# Patient Record
Sex: Female | Born: 1975 | Race: White | Hispanic: No | Smoking: Never smoker
Health system: Southern US, Community
[De-identification: ages and names within clinical notes are randomized; demographics above are authoritative.]

## PROBLEM LIST (undated history)

## (undated) DIAGNOSIS — T7840XA Allergy, unspecified, initial encounter: Secondary | ICD-10-CM

## (undated) DIAGNOSIS — R51 Headache: Secondary | ICD-10-CM

## (undated) DIAGNOSIS — D649 Anemia, unspecified: Secondary | ICD-10-CM

## (undated) DIAGNOSIS — Z23 Encounter for immunization: Secondary | ICD-10-CM

## (undated) DIAGNOSIS — F32A Depression, unspecified: Secondary | ICD-10-CM

## (undated) DIAGNOSIS — J302 Other seasonal allergic rhinitis: Secondary | ICD-10-CM

## (undated) DIAGNOSIS — R519 Headache, unspecified: Secondary | ICD-10-CM

## (undated) DIAGNOSIS — Z9289 Personal history of other medical treatment: Secondary | ICD-10-CM

## (undated) DIAGNOSIS — S86019A Strain of unspecified Achilles tendon, initial encounter: Secondary | ICD-10-CM

## (undated) DIAGNOSIS — F419 Anxiety disorder, unspecified: Secondary | ICD-10-CM

## (undated) HISTORY — DX: Anxiety disorder, unspecified: F41.9

## (undated) HISTORY — DX: Strain of unspecified achilles tendon, initial encounter: S86.019A

## (undated) HISTORY — PX: WISDOM TOOTH EXTRACTION: SHX21

## (undated) HISTORY — DX: Personal history of other medical treatment: Z92.89

## (undated) HISTORY — DX: Headache: R51

## (undated) HISTORY — DX: Other seasonal allergic rhinitis: J30.2

## (undated) HISTORY — DX: Headache, unspecified: R51.9

## (undated) HISTORY — DX: Depression, unspecified: F32.A

## (undated) HISTORY — DX: Allergy, unspecified, initial encounter: T78.40XA

## (undated) HISTORY — DX: Anemia, unspecified: D64.9

## (undated) HISTORY — DX: Encounter for immunization: Z23

---

## 2005-07-23 ENCOUNTER — Ambulatory Visit: Payer: Self-pay | Admitting: Sports Medicine

## 2005-08-13 ENCOUNTER — Ambulatory Visit: Payer: Self-pay | Admitting: Sports Medicine

## 2005-12-06 ENCOUNTER — Ambulatory Visit: Payer: Self-pay | Admitting: Sports Medicine

## 2006-03-25 ENCOUNTER — Ambulatory Visit: Payer: Self-pay | Admitting: Sports Medicine

## 2006-05-20 ENCOUNTER — Ambulatory Visit: Payer: Self-pay | Admitting: Sports Medicine

## 2006-07-22 ENCOUNTER — Ambulatory Visit: Payer: Self-pay | Admitting: Sports Medicine

## 2006-07-24 ENCOUNTER — Other Ambulatory Visit: Admission: RE | Admit: 2006-07-24 | Discharge: 2006-07-24 | Payer: Self-pay | Admitting: Obstetrics and Gynecology

## 2007-03-13 ENCOUNTER — Ambulatory Visit: Payer: Self-pay | Admitting: Sports Medicine

## 2007-03-13 DIAGNOSIS — M25569 Pain in unspecified knee: Secondary | ICD-10-CM | POA: Insufficient documentation

## 2007-04-03 DIAGNOSIS — M76829 Posterior tibial tendinitis, unspecified leg: Secondary | ICD-10-CM | POA: Insufficient documentation

## 2007-05-13 ENCOUNTER — Ambulatory Visit: Payer: Self-pay | Admitting: Sports Medicine

## 2007-09-29 ENCOUNTER — Other Ambulatory Visit: Admission: RE | Admit: 2007-09-29 | Discharge: 2007-09-29 | Payer: Self-pay | Admitting: Obstetrics and Gynecology

## 2009-05-07 ENCOUNTER — Inpatient Hospital Stay: Payer: Self-pay

## 2010-10-23 NOTE — Assessment & Plan Note (Signed)
Summary: foot pain/runner/el   Vital Signs:  Patient Profile:   35 Years Old Female Weight:      126 pounds Pulse rate:   91 / minute BP sitting:   144 / 84  Vitals Entered By: Lillia Pauls CMA (May 13, 2007 9:17 AM)               Chief Complaint:  L TOP OF FOOT PAIN X 6 WEEKS.  History of Present Illness: 35 y/o F with L dorsal foot pain. Began 11 Jul-- ran 6 miles that am.  Then was member of wedding party-- in high heels all day, then had dancing in high-heeled shoes c tight laces-- by end of evening, signif swelling throughout dorsum of foot.  Pt is runner, pre-injury was at 25 mi/wk. Since injury, unable to run s pain.  Still c swelling which localizes to dorsal forefoot near MT heads.  pt points to pain specifically along EHL tendon, at TMT joint, also at tib anterior tendon  Pt BP also noted to be mildly elevated, pt states cafe latte just prior to appt, and endorses signif stress with mult recent life changes.        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  insp: L with visible nodule/swelling at Tib anterior tendon, near level of talonavic joint visible swelling length of EHL pt with thin feet, min subcut fat.  palp: focal ttp L TMT joint, ttp along tib anterior at nodule.  U/S assessment-- L tib ant: positive bull'seye on transverse view, pos fluid collection on lateral view, some disorganization of tendon fibers. 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 We will attempt padding of tongue of walking and running shoes, as well as an arch binder brace continue icing gradual return to activity pt to see [place] back in 3 weeks, for reassessment voices understanding of plan  Complete Medication List: 1)  Voltaren 1 % Gel (Diclofenac sodium)   Patient Instructions: 1)  follow up in 3 weeks

## 2010-10-23 NOTE — Assessment & Plan Note (Signed)
Summary: adjust orthotics/el   Vital Signs:  Patient Profile:   35 Years Old Female Weight:      122 pounds Pulse rate:   73 / minute BP sitting:   138 / 88  Vitals Entered By: Lillia Pauls CMA (March 13, 2007 10:39 AM)               Chief Complaint:  adjust orthotics.  History of Present Illness: Veronica Gibson back for recheck orthotics have been comfortable now having some RT ant knee pain though No hx of injury for this keeping mileage at 30 per week feels pain toward end of long run  doing wt work - lunges appear too deep        Physical Exam  General:     Well-developed,well-nourished,in no acute distress; alert,appropriate and cooperative throughout examination; thin but muscular Head:     Normocephalic and atraumatic without obvious abnormalities. No apparent alopecia or balding. Msk:     knee exam shows no effusion; stable ligaments; negative Mcmurray's and provocative meniscal tests; non painful patellar compression; patellar and quadriceps tendons unremarkable  VMO strength good bilat However RT leg shows weakness at Rt gluteus medius step down shows valgus drift of knee     Impression & Recommendations:  Problem # 1:  KNEE PAIN, RIGHT (ICD-719.46) Assessment: New given 4 way straight leg lifts lat leg strength emphasis on RT change wt work and use modified technique for lunge step downs and procpiroceptive training keep up orthotic use re ck prn Orders: [facility]- Est Level  3 ([id number])

## 2010-11-08 ENCOUNTER — Inpatient Hospital Stay: Payer: Self-pay

## 2013-03-24 ENCOUNTER — Encounter: Payer: Self-pay | Admitting: Sports Medicine

## 2013-03-24 ENCOUNTER — Ambulatory Visit (INDEPENDENT_AMBULATORY_CARE_PROVIDER_SITE_OTHER): Payer: BC Managed Care – PPO | Admitting: Sports Medicine

## 2013-03-24 VITALS — BP 110/72 | HR 68 | Ht 65.0 in | Wt 120.0 lb

## 2013-03-24 DIAGNOSIS — M357 Hypermobility syndrome: Secondary | ICD-10-CM | POA: Insufficient documentation

## 2013-03-24 DIAGNOSIS — R269 Unspecified abnormalities of gait and mobility: Secondary | ICD-10-CM

## 2013-03-24 DIAGNOSIS — Q796 Ehlers-Danlos syndrome, unspecified: Secondary | ICD-10-CM

## 2013-03-24 NOTE — Assessment & Plan Note (Signed)
Patient was fitted for a : standard, cushioned, semi-rigid orthotic. The orthotic was heated and afterward the patient stood on the orthotic blank positioned on the orthotic stand. The patient was positioned in subtalar neutral position and 10 degrees of ankle dorsiflexion in a weight bearing stance. After completion of molding, a stable base was applied to the orthotic blank. The blank was ground to a stable position for weight bearing. Size: 7 red EVA Base: blue EVA Posting: lateral 5th ray posts Additional orthotic padding: heel wedges and MT pads  We spent 45 minutes making corrections on new orthotics. On completion of these she was able to run with a neutral foot strike.  Her last pair lasted 8 years. I advised her that most people only get about 3 years but she should see if they are controlling her pain and if so she can continue using until they show significant breakdown

## 2013-03-24 NOTE — Assessment & Plan Note (Signed)
I suggested strength exercises around major joints because with her hypermobility she is at a risk for joint and musculoskeletal injury as she begins training for a new marathon  She also needs to continue on some abdominal and pelvic exercises She has a tendency toward some stress incontinence with running too fast that arose after childbirth

## 2013-03-24 NOTE — Progress Notes (Signed)
Patient ID: Veronica Gibson, female   DOB: [birth year], 37 y.o.   MRN: [id number]  Hx of navicular Fx RT foot in HS Has been in orthotics since that time We made orthotics in 2006  Hx of knee pain Rt > LT  Ran HS and D3 Lot of knee pain in college  Changing shoes frequently helps  Professor at [employer]  Thin and fit Excellent quad and hip strength Alignment good Leg length is normal  Feet show splaying at 1/2 toes Hammering and deviation of toes 4/5 small bunionettes Rt arch pronates  beighton score 5/6  Running gait shows pronation unless she has correction

## 2013-09-29 ENCOUNTER — Ambulatory Visit (INDEPENDENT_AMBULATORY_CARE_PROVIDER_SITE_OTHER): Payer: BC Managed Care – PPO | Admitting: Sports Medicine

## 2013-09-29 ENCOUNTER — Encounter: Payer: Self-pay | Admitting: Sports Medicine

## 2013-09-29 VITALS — BP 125/77 | Ht 65.5 in | Wt 118.0 lb

## 2013-09-29 DIAGNOSIS — M7750 Other enthesopathy of unspecified foot: Secondary | ICD-10-CM

## 2013-09-29 DIAGNOSIS — M7742 Metatarsalgia, left foot: Principal | ICD-10-CM

## 2013-09-29 DIAGNOSIS — M7741 Metatarsalgia, right foot: Secondary | ICD-10-CM | POA: Insufficient documentation

## 2013-09-29 DIAGNOSIS — M7651 Patellar tendinitis, right knee: Secondary | ICD-10-CM

## 2013-09-29 DIAGNOSIS — R269 Unspecified abnormalities of gait and mobility: Secondary | ICD-10-CM

## 2013-09-29 DIAGNOSIS — M7650 Patellar tendinitis, unspecified knee: Secondary | ICD-10-CM

## 2013-09-29 NOTE — Progress Notes (Addendum)
   Subjective:    Patient ID: Veronica Gibson, female    DOB: 1975/10/01, 38 y.o.   MRN: [id number]  HPI  Pt presents to clinic for evaluation of left toe pain that has persisted since running the [place] marathon in mid November.   Had in her in orthotics for greater than 10 years for transverse arch collapse and metatarsalgia These have allowed her to run a with minimal foot pain until recently Former HS and collegiate runner  Rt anterior knee pain- flared x 2 days. Hx of fall on rt knee with ice skating years ago.   Social history-professor of statistics at [employer]     Objective:   Physical Exam NAD, thin female BP 125/77  Ht 5' 5.5" (1.664 m)  Wt 118 lb (53.524 kg)  BMI 19.33 kg/m2  Lt foot - varus deviation of lat collum with bunionette Toes 2-3 show valgus deviation Transverse arch moderately collapsed Callus and MT thickening on 2nd MT head   Rt foot - similar changes but less  Transverse arch mild drop   Long arch preserved bilat  Good post tib function   Rt knee- Good ROM Ligament testing normal Mcmurry's negative  Minimal tenderness at the upper patellar tendon No effusion  Ultrasound examination At the medial and midportion of the upper patellar tendon there is a small hypoechoic area On transverse scan this seems to be less than 10% of the tendon Doppler flow is unremarkable Quadriceps tendon, meniscus and retropatellar cartilage all look normal No suprapatellar Pouch effusion      Assessment & Plan:

## 2013-09-29 NOTE — Assessment & Plan Note (Signed)
Resupported both arches on her orthotics  Added a lateral post because of the lateral foot breakdown  This was comfortable after completion

## 2013-09-29 NOTE — Assessment & Plan Note (Signed)
I replaced metatarsal pads on both of her  Orthotics  This helped reduce the forefoot pain

## 2013-09-29 NOTE — Assessment & Plan Note (Signed)
Small deficit in tendon noted on ultrasound  Begin a rehabilitation program of declined squats  Modify other lifting  Use a patellar helix strap  If this worsens we will consider nitroglycerin patches  rck prn

## 2013-12-13 DIAGNOSIS — Z9289 Personal history of other medical treatment: Secondary | ICD-10-CM

## 2013-12-13 HISTORY — DX: Personal history of other medical treatment: Z92.89

## 2013-12-13 LAB — HM MAMMOGRAPHY

## 2014-04-07 DIAGNOSIS — Z9289 Personal history of other medical treatment: Secondary | ICD-10-CM

## 2014-04-07 HISTORY — DX: Personal history of other medical treatment: Z92.89

## 2014-04-07 LAB — HM PAP SMEAR

## 2015-12-28 DIAGNOSIS — J301 Allergic rhinitis due to pollen: Secondary | ICD-10-CM | POA: Diagnosis not present

## 2015-12-28 DIAGNOSIS — J3089 Other allergic rhinitis: Secondary | ICD-10-CM | POA: Diagnosis not present

## 2015-12-28 DIAGNOSIS — J3081 Allergic rhinitis due to animal (cat) (dog) hair and dander: Secondary | ICD-10-CM | POA: Diagnosis not present

## 2016-01-04 DIAGNOSIS — J301 Allergic rhinitis due to pollen: Secondary | ICD-10-CM | POA: Diagnosis not present

## 2016-01-04 DIAGNOSIS — J3081 Allergic rhinitis due to animal (cat) (dog) hair and dander: Secondary | ICD-10-CM | POA: Diagnosis not present

## 2016-01-04 DIAGNOSIS — J3089 Other allergic rhinitis: Secondary | ICD-10-CM | POA: Diagnosis not present

## 2016-01-09 DIAGNOSIS — J3081 Allergic rhinitis due to animal (cat) (dog) hair and dander: Secondary | ICD-10-CM | POA: Diagnosis not present

## 2016-01-09 DIAGNOSIS — J301 Allergic rhinitis due to pollen: Secondary | ICD-10-CM | POA: Diagnosis not present

## 2016-01-09 DIAGNOSIS — J3089 Other allergic rhinitis: Secondary | ICD-10-CM | POA: Diagnosis not present

## 2016-01-16 DIAGNOSIS — J3081 Allergic rhinitis due to animal (cat) (dog) hair and dander: Secondary | ICD-10-CM | POA: Diagnosis not present

## 2016-01-16 DIAGNOSIS — J3089 Other allergic rhinitis: Secondary | ICD-10-CM | POA: Diagnosis not present

## 2016-01-16 DIAGNOSIS — J301 Allergic rhinitis due to pollen: Secondary | ICD-10-CM | POA: Diagnosis not present

## 2016-01-17 DIAGNOSIS — F411 Generalized anxiety disorder: Secondary | ICD-10-CM | POA: Diagnosis not present

## 2016-01-25 DIAGNOSIS — J3089 Other allergic rhinitis: Secondary | ICD-10-CM | POA: Diagnosis not present

## 2016-01-25 DIAGNOSIS — J3081 Allergic rhinitis due to animal (cat) (dog) hair and dander: Secondary | ICD-10-CM | POA: Diagnosis not present

## 2016-01-25 DIAGNOSIS — J301 Allergic rhinitis due to pollen: Secondary | ICD-10-CM | POA: Diagnosis not present

## 2016-01-30 DIAGNOSIS — J3081 Allergic rhinitis due to animal (cat) (dog) hair and dander: Secondary | ICD-10-CM | POA: Diagnosis not present

## 2016-01-30 DIAGNOSIS — J3089 Other allergic rhinitis: Secondary | ICD-10-CM | POA: Diagnosis not present

## 2016-01-30 DIAGNOSIS — J301 Allergic rhinitis due to pollen: Secondary | ICD-10-CM | POA: Diagnosis not present

## 2016-02-06 DIAGNOSIS — J3081 Allergic rhinitis due to animal (cat) (dog) hair and dander: Secondary | ICD-10-CM | POA: Diagnosis not present

## 2016-02-06 DIAGNOSIS — J301 Allergic rhinitis due to pollen: Secondary | ICD-10-CM | POA: Diagnosis not present

## 2016-02-06 DIAGNOSIS — J3089 Other allergic rhinitis: Secondary | ICD-10-CM | POA: Diagnosis not present

## 2016-02-08 DIAGNOSIS — J3081 Allergic rhinitis due to animal (cat) (dog) hair and dander: Secondary | ICD-10-CM | POA: Diagnosis not present

## 2016-02-08 DIAGNOSIS — J301 Allergic rhinitis due to pollen: Secondary | ICD-10-CM | POA: Diagnosis not present

## 2016-02-09 DIAGNOSIS — J3089 Other allergic rhinitis: Secondary | ICD-10-CM | POA: Diagnosis not present

## 2016-02-09 DIAGNOSIS — J3081 Allergic rhinitis due to animal (cat) (dog) hair and dander: Secondary | ICD-10-CM | POA: Diagnosis not present

## 2016-02-09 DIAGNOSIS — J301 Allergic rhinitis due to pollen: Secondary | ICD-10-CM | POA: Diagnosis not present

## 2016-02-15 ENCOUNTER — Encounter: Payer: Self-pay | Admitting: Podiatry

## 2016-02-15 ENCOUNTER — Ambulatory Visit (INDEPENDENT_AMBULATORY_CARE_PROVIDER_SITE_OTHER): Payer: BLUE CROSS/BLUE SHIELD

## 2016-02-15 ENCOUNTER — Ambulatory Visit (INDEPENDENT_AMBULATORY_CARE_PROVIDER_SITE_OTHER): Payer: BLUE CROSS/BLUE SHIELD | Admitting: Podiatry

## 2016-02-15 VITALS — BP 114/60 | HR 54 | Resp 18

## 2016-02-15 DIAGNOSIS — M2580 Other specified joint disorders, unspecified joint: Secondary | ICD-10-CM

## 2016-02-15 DIAGNOSIS — J3089 Other allergic rhinitis: Secondary | ICD-10-CM | POA: Diagnosis not present

## 2016-02-15 DIAGNOSIS — R52 Pain, unspecified: Secondary | ICD-10-CM

## 2016-02-15 DIAGNOSIS — J301 Allergic rhinitis due to pollen: Secondary | ICD-10-CM | POA: Diagnosis not present

## 2016-02-15 DIAGNOSIS — J3081 Allergic rhinitis due to animal (cat) (dog) hair and dander: Secondary | ICD-10-CM | POA: Diagnosis not present

## 2016-02-15 NOTE — Progress Notes (Signed)
Subjective:     Patient ID: Veronica Gibson, female   DOB: 12-May-1976, 40 y.o.   MRN: [id number]  HPI 40 year old female presents the office today for concerns of pain in the ball of her left foot which is been ongoing for about 1 year. She was the pain started after she was doing a 5 mile run uphill. She is an avid runner and runs marathons. She has tried offloading type pad which helps some. The patch that she uses is a teardrop pad. She states his been swollen over the last year but has decreased somewhat. No redness or warmth. She still able to run about 5 miles a day at a jog without any problems. She has CMO. No other complaint.s   Review of Systems  All other systems reviewed and are negative.      Objective:   Physical Exam General: AAO x3, NAD  Dermatological: Skin is warm, dry and supple bilateral. Nails x 10 are well manicured; remaining integument appears unremarkable at this time. There are no open sores, no preulcerative lesions, no rash or signs of infection present.  Vascular: Dorsalis Pedis artery and Posterior Tibial artery pedal pulses are 2/4 bilateral with immedate capillary fill time. Pedal hair growth present. No varicosities and no lower extremity edema present bilateral. There is no pain with calf compression, swelling, warmth, erythema.   Neruologic: Grossly intact via light touch bilateral. Vibratory intact via tuning fork bilateral. Protective threshold with Semmes Wienstein monofilament intact to all pedal sites bilateral. Patellar and Achilles deep tendon reflexes 2+ bilateral. No Babinski or clonus noted bilateral.   Musculoskeletal: There is a decrease in medial arch height upon weightbearing. There are some mild edema to the plantar aspect left foot submetatarsal one. There is no pain to the sesamoids or to the MPJ. There is a mild HAV present. No overlying erythema or increase in warmth. No area pinpoint bony tenderness there is no pain vibratory sensation. MMT  5/5, ROM WNL.   Gait: Unassisted, Nonantalgic.      Assessment:     40 year old female with sesamoiditis; biomechanical changes      Plan:     -Treatment options discussed including all alternatives, risks, and complications -Etiology of symptoms were discussed -X-rays were obtained and reviewed with the patient. No evidence of acute fracture. On the medial sesamoid there is a small area of which may be an old injury. -I made an offloading pad for the sesamoids and applied this to her insert and I also gave her other pads to try is well. -Ice as well as anti-inflammatories as needed -Follow-up in 4 weeks if symptoms continue or sooner if any issues are to arise.  Celesta Gentile, DPM

## 2016-02-20 DIAGNOSIS — J3081 Allergic rhinitis due to animal (cat) (dog) hair and dander: Secondary | ICD-10-CM | POA: Diagnosis not present

## 2016-02-20 DIAGNOSIS — J301 Allergic rhinitis due to pollen: Secondary | ICD-10-CM | POA: Diagnosis not present

## 2016-02-20 DIAGNOSIS — J3089 Other allergic rhinitis: Secondary | ICD-10-CM | POA: Diagnosis not present

## 2016-02-22 DIAGNOSIS — J3081 Allergic rhinitis due to animal (cat) (dog) hair and dander: Secondary | ICD-10-CM | POA: Diagnosis not present

## 2016-02-22 DIAGNOSIS — J301 Allergic rhinitis due to pollen: Secondary | ICD-10-CM | POA: Diagnosis not present

## 2016-02-22 DIAGNOSIS — J3089 Other allergic rhinitis: Secondary | ICD-10-CM | POA: Diagnosis not present

## 2016-02-29 DIAGNOSIS — J3081 Allergic rhinitis due to animal (cat) (dog) hair and dander: Secondary | ICD-10-CM | POA: Diagnosis not present

## 2016-02-29 DIAGNOSIS — J301 Allergic rhinitis due to pollen: Secondary | ICD-10-CM | POA: Diagnosis not present

## 2016-02-29 DIAGNOSIS — J3089 Other allergic rhinitis: Secondary | ICD-10-CM | POA: Diagnosis not present

## 2016-03-06 DIAGNOSIS — J3089 Other allergic rhinitis: Secondary | ICD-10-CM | POA: Diagnosis not present

## 2016-03-12 DIAGNOSIS — J3081 Allergic rhinitis due to animal (cat) (dog) hair and dander: Secondary | ICD-10-CM | POA: Diagnosis not present

## 2016-03-12 DIAGNOSIS — J301 Allergic rhinitis due to pollen: Secondary | ICD-10-CM | POA: Diagnosis not present

## 2016-03-12 DIAGNOSIS — J3089 Other allergic rhinitis: Secondary | ICD-10-CM | POA: Diagnosis not present

## 2016-03-28 DIAGNOSIS — J3081 Allergic rhinitis due to animal (cat) (dog) hair and dander: Secondary | ICD-10-CM | POA: Diagnosis not present

## 2016-03-28 DIAGNOSIS — J301 Allergic rhinitis due to pollen: Secondary | ICD-10-CM | POA: Diagnosis not present

## 2016-03-28 DIAGNOSIS — J3089 Other allergic rhinitis: Secondary | ICD-10-CM | POA: Diagnosis not present

## 2016-04-02 DIAGNOSIS — J3081 Allergic rhinitis due to animal (cat) (dog) hair and dander: Secondary | ICD-10-CM | POA: Diagnosis not present

## 2016-04-02 DIAGNOSIS — J301 Allergic rhinitis due to pollen: Secondary | ICD-10-CM | POA: Diagnosis not present

## 2016-04-02 DIAGNOSIS — J3089 Other allergic rhinitis: Secondary | ICD-10-CM | POA: Diagnosis not present

## 2016-04-05 DIAGNOSIS — J3089 Other allergic rhinitis: Secondary | ICD-10-CM | POA: Diagnosis not present

## 2016-04-05 DIAGNOSIS — J3081 Allergic rhinitis due to animal (cat) (dog) hair and dander: Secondary | ICD-10-CM | POA: Diagnosis not present

## 2016-04-05 DIAGNOSIS — J301 Allergic rhinitis due to pollen: Secondary | ICD-10-CM | POA: Diagnosis not present

## 2016-04-09 DIAGNOSIS — J3081 Allergic rhinitis due to animal (cat) (dog) hair and dander: Secondary | ICD-10-CM | POA: Diagnosis not present

## 2016-04-09 DIAGNOSIS — J3089 Other allergic rhinitis: Secondary | ICD-10-CM | POA: Diagnosis not present

## 2016-04-09 DIAGNOSIS — J301 Allergic rhinitis due to pollen: Secondary | ICD-10-CM | POA: Diagnosis not present

## 2016-04-11 DIAGNOSIS — J3081 Allergic rhinitis due to animal (cat) (dog) hair and dander: Secondary | ICD-10-CM | POA: Diagnosis not present

## 2016-04-11 DIAGNOSIS — J301 Allergic rhinitis due to pollen: Secondary | ICD-10-CM | POA: Diagnosis not present

## 2016-04-11 DIAGNOSIS — J3089 Other allergic rhinitis: Secondary | ICD-10-CM | POA: Diagnosis not present

## 2016-04-17 DIAGNOSIS — F411 Generalized anxiety disorder: Secondary | ICD-10-CM | POA: Diagnosis not present

## 2016-04-22 DIAGNOSIS — Z01419 Encounter for gynecological examination (general) (routine) without abnormal findings: Secondary | ICD-10-CM | POA: Diagnosis not present

## 2016-04-23 DIAGNOSIS — J3089 Other allergic rhinitis: Secondary | ICD-10-CM | POA: Diagnosis not present

## 2016-04-23 DIAGNOSIS — J301 Allergic rhinitis due to pollen: Secondary | ICD-10-CM | POA: Diagnosis not present

## 2016-04-23 DIAGNOSIS — J3081 Allergic rhinitis due to animal (cat) (dog) hair and dander: Secondary | ICD-10-CM | POA: Diagnosis not present

## 2016-04-25 DIAGNOSIS — J3081 Allergic rhinitis due to animal (cat) (dog) hair and dander: Secondary | ICD-10-CM | POA: Diagnosis not present

## 2016-04-25 DIAGNOSIS — J3089 Other allergic rhinitis: Secondary | ICD-10-CM | POA: Diagnosis not present

## 2016-04-25 DIAGNOSIS — J301 Allergic rhinitis due to pollen: Secondary | ICD-10-CM | POA: Diagnosis not present

## 2016-04-30 DIAGNOSIS — J3081 Allergic rhinitis due to animal (cat) (dog) hair and dander: Secondary | ICD-10-CM | POA: Diagnosis not present

## 2016-04-30 DIAGNOSIS — J301 Allergic rhinitis due to pollen: Secondary | ICD-10-CM | POA: Diagnosis not present

## 2016-04-30 DIAGNOSIS — J3089 Other allergic rhinitis: Secondary | ICD-10-CM | POA: Diagnosis not present

## 2016-05-07 DIAGNOSIS — J301 Allergic rhinitis due to pollen: Secondary | ICD-10-CM | POA: Diagnosis not present

## 2016-05-07 DIAGNOSIS — J3089 Other allergic rhinitis: Secondary | ICD-10-CM | POA: Diagnosis not present

## 2016-05-07 DIAGNOSIS — J3081 Allergic rhinitis due to animal (cat) (dog) hair and dander: Secondary | ICD-10-CM | POA: Diagnosis not present

## 2016-05-16 DIAGNOSIS — J301 Allergic rhinitis due to pollen: Secondary | ICD-10-CM | POA: Diagnosis not present

## 2016-05-16 DIAGNOSIS — J3081 Allergic rhinitis due to animal (cat) (dog) hair and dander: Secondary | ICD-10-CM | POA: Diagnosis not present

## 2016-05-16 DIAGNOSIS — J3089 Other allergic rhinitis: Secondary | ICD-10-CM | POA: Diagnosis not present

## 2016-05-21 DIAGNOSIS — J301 Allergic rhinitis due to pollen: Secondary | ICD-10-CM | POA: Diagnosis not present

## 2016-05-21 DIAGNOSIS — J3089 Other allergic rhinitis: Secondary | ICD-10-CM | POA: Diagnosis not present

## 2016-05-21 DIAGNOSIS — J3081 Allergic rhinitis due to animal (cat) (dog) hair and dander: Secondary | ICD-10-CM | POA: Diagnosis not present

## 2016-05-30 DIAGNOSIS — J301 Allergic rhinitis due to pollen: Secondary | ICD-10-CM | POA: Diagnosis not present

## 2016-05-30 DIAGNOSIS — J3089 Other allergic rhinitis: Secondary | ICD-10-CM | POA: Diagnosis not present

## 2016-05-30 DIAGNOSIS — J3081 Allergic rhinitis due to animal (cat) (dog) hair and dander: Secondary | ICD-10-CM | POA: Diagnosis not present

## 2016-06-07 DIAGNOSIS — J301 Allergic rhinitis due to pollen: Secondary | ICD-10-CM | POA: Diagnosis not present

## 2016-06-07 DIAGNOSIS — J3089 Other allergic rhinitis: Secondary | ICD-10-CM | POA: Diagnosis not present

## 2016-06-07 DIAGNOSIS — J3081 Allergic rhinitis due to animal (cat) (dog) hair and dander: Secondary | ICD-10-CM | POA: Diagnosis not present

## 2016-06-28 DIAGNOSIS — J301 Allergic rhinitis due to pollen: Secondary | ICD-10-CM | POA: Diagnosis not present

## 2016-06-28 DIAGNOSIS — J3089 Other allergic rhinitis: Secondary | ICD-10-CM | POA: Diagnosis not present

## 2016-06-28 DIAGNOSIS — J3081 Allergic rhinitis due to animal (cat) (dog) hair and dander: Secondary | ICD-10-CM | POA: Diagnosis not present

## 2016-07-05 DIAGNOSIS — J3089 Other allergic rhinitis: Secondary | ICD-10-CM | POA: Diagnosis not present

## 2016-07-05 DIAGNOSIS — J301 Allergic rhinitis due to pollen: Secondary | ICD-10-CM | POA: Diagnosis not present

## 2016-07-05 DIAGNOSIS — J3081 Allergic rhinitis due to animal (cat) (dog) hair and dander: Secondary | ICD-10-CM | POA: Diagnosis not present

## 2016-07-09 DIAGNOSIS — J3089 Other allergic rhinitis: Secondary | ICD-10-CM | POA: Diagnosis not present

## 2016-07-09 DIAGNOSIS — J3081 Allergic rhinitis due to animal (cat) (dog) hair and dander: Secondary | ICD-10-CM | POA: Diagnosis not present

## 2016-07-09 DIAGNOSIS — J301 Allergic rhinitis due to pollen: Secondary | ICD-10-CM | POA: Diagnosis not present

## 2016-07-18 DIAGNOSIS — J3081 Allergic rhinitis due to animal (cat) (dog) hair and dander: Secondary | ICD-10-CM | POA: Diagnosis not present

## 2016-07-18 DIAGNOSIS — J301 Allergic rhinitis due to pollen: Secondary | ICD-10-CM | POA: Diagnosis not present

## 2016-07-18 DIAGNOSIS — J3089 Other allergic rhinitis: Secondary | ICD-10-CM | POA: Diagnosis not present

## 2016-08-08 DIAGNOSIS — F411 Generalized anxiety disorder: Secondary | ICD-10-CM | POA: Diagnosis not present

## 2016-08-09 DIAGNOSIS — J301 Allergic rhinitis due to pollen: Secondary | ICD-10-CM | POA: Diagnosis not present

## 2016-08-09 DIAGNOSIS — J3089 Other allergic rhinitis: Secondary | ICD-10-CM | POA: Diagnosis not present

## 2016-08-09 DIAGNOSIS — J3081 Allergic rhinitis due to animal (cat) (dog) hair and dander: Secondary | ICD-10-CM | POA: Diagnosis not present

## 2016-08-13 DIAGNOSIS — J3089 Other allergic rhinitis: Secondary | ICD-10-CM | POA: Diagnosis not present

## 2016-08-13 DIAGNOSIS — J301 Allergic rhinitis due to pollen: Secondary | ICD-10-CM | POA: Diagnosis not present

## 2016-08-13 DIAGNOSIS — J3081 Allergic rhinitis due to animal (cat) (dog) hair and dander: Secondary | ICD-10-CM | POA: Diagnosis not present

## 2016-08-21 DIAGNOSIS — J3081 Allergic rhinitis due to animal (cat) (dog) hair and dander: Secondary | ICD-10-CM | POA: Diagnosis not present

## 2016-08-21 DIAGNOSIS — J301 Allergic rhinitis due to pollen: Secondary | ICD-10-CM | POA: Diagnosis not present

## 2016-08-23 DIAGNOSIS — J3081 Allergic rhinitis due to animal (cat) (dog) hair and dander: Secondary | ICD-10-CM | POA: Diagnosis not present

## 2016-08-23 DIAGNOSIS — J301 Allergic rhinitis due to pollen: Secondary | ICD-10-CM | POA: Diagnosis not present

## 2016-08-23 DIAGNOSIS — J3089 Other allergic rhinitis: Secondary | ICD-10-CM | POA: Diagnosis not present

## 2016-08-29 DIAGNOSIS — J3089 Other allergic rhinitis: Secondary | ICD-10-CM | POA: Diagnosis not present

## 2016-08-29 DIAGNOSIS — J3081 Allergic rhinitis due to animal (cat) (dog) hair and dander: Secondary | ICD-10-CM | POA: Diagnosis not present

## 2016-08-29 DIAGNOSIS — J301 Allergic rhinitis due to pollen: Secondary | ICD-10-CM | POA: Diagnosis not present

## 2016-09-03 DIAGNOSIS — J301 Allergic rhinitis due to pollen: Secondary | ICD-10-CM | POA: Diagnosis not present

## 2016-09-03 DIAGNOSIS — J3081 Allergic rhinitis due to animal (cat) (dog) hair and dander: Secondary | ICD-10-CM | POA: Diagnosis not present

## 2016-09-03 DIAGNOSIS — J3089 Other allergic rhinitis: Secondary | ICD-10-CM | POA: Diagnosis not present

## 2016-09-05 DIAGNOSIS — J3089 Other allergic rhinitis: Secondary | ICD-10-CM | POA: Diagnosis not present

## 2016-09-05 DIAGNOSIS — J3081 Allergic rhinitis due to animal (cat) (dog) hair and dander: Secondary | ICD-10-CM | POA: Diagnosis not present

## 2016-09-05 DIAGNOSIS — J301 Allergic rhinitis due to pollen: Secondary | ICD-10-CM | POA: Diagnosis not present

## 2016-09-12 DIAGNOSIS — J3081 Allergic rhinitis due to animal (cat) (dog) hair and dander: Secondary | ICD-10-CM | POA: Diagnosis not present

## 2016-09-12 DIAGNOSIS — J3089 Other allergic rhinitis: Secondary | ICD-10-CM | POA: Diagnosis not present

## 2016-09-12 DIAGNOSIS — J301 Allergic rhinitis due to pollen: Secondary | ICD-10-CM | POA: Diagnosis not present

## 2016-09-19 DIAGNOSIS — J3089 Other allergic rhinitis: Secondary | ICD-10-CM | POA: Diagnosis not present

## 2016-09-19 DIAGNOSIS — J301 Allergic rhinitis due to pollen: Secondary | ICD-10-CM | POA: Diagnosis not present

## 2016-09-19 DIAGNOSIS — J3081 Allergic rhinitis due to animal (cat) (dog) hair and dander: Secondary | ICD-10-CM | POA: Diagnosis not present

## 2016-09-20 DIAGNOSIS — J3089 Other allergic rhinitis: Secondary | ICD-10-CM | POA: Diagnosis not present

## 2016-09-20 DIAGNOSIS — J3081 Allergic rhinitis due to animal (cat) (dog) hair and dander: Secondary | ICD-10-CM | POA: Diagnosis not present

## 2016-09-20 DIAGNOSIS — J301 Allergic rhinitis due to pollen: Secondary | ICD-10-CM | POA: Diagnosis not present

## 2016-09-25 DIAGNOSIS — J3089 Other allergic rhinitis: Secondary | ICD-10-CM | POA: Diagnosis not present

## 2016-11-04 DIAGNOSIS — F411 Generalized anxiety disorder: Secondary | ICD-10-CM | POA: Diagnosis not present

## 2016-11-14 DIAGNOSIS — J3081 Allergic rhinitis due to animal (cat) (dog) hair and dander: Secondary | ICD-10-CM | POA: Diagnosis not present

## 2016-11-14 DIAGNOSIS — J301 Allergic rhinitis due to pollen: Secondary | ICD-10-CM | POA: Diagnosis not present

## 2016-11-14 DIAGNOSIS — J3089 Other allergic rhinitis: Secondary | ICD-10-CM | POA: Diagnosis not present

## 2016-11-19 DIAGNOSIS — J3089 Other allergic rhinitis: Secondary | ICD-10-CM | POA: Diagnosis not present

## 2016-11-19 DIAGNOSIS — J3081 Allergic rhinitis due to animal (cat) (dog) hair and dander: Secondary | ICD-10-CM | POA: Diagnosis not present

## 2016-11-19 DIAGNOSIS — J301 Allergic rhinitis due to pollen: Secondary | ICD-10-CM | POA: Diagnosis not present

## 2016-11-21 DIAGNOSIS — J301 Allergic rhinitis due to pollen: Secondary | ICD-10-CM | POA: Diagnosis not present

## 2016-11-21 DIAGNOSIS — J3081 Allergic rhinitis due to animal (cat) (dog) hair and dander: Secondary | ICD-10-CM | POA: Diagnosis not present

## 2016-11-21 DIAGNOSIS — J3089 Other allergic rhinitis: Secondary | ICD-10-CM | POA: Diagnosis not present

## 2016-11-25 ENCOUNTER — Ambulatory Visit: Payer: BLUE CROSS/BLUE SHIELD | Admitting: Medical

## 2016-11-25 VITALS — BP 135/80 | HR 86 | Temp 99.9°F

## 2016-11-25 DIAGNOSIS — J111 Influenza due to unidentified influenza virus with other respiratory manifestations: Secondary | ICD-10-CM

## 2016-11-25 MED ORDER — OSELTAMIVIR PHOSPHATE 75 MG PO CAPS: 75.0000 mg | ORAL_CAPSULE | Freq: Two times a day (BID) | ORAL | 0 refills | Status: AC

## 2016-11-25 NOTE — Progress Notes (Signed)
   Subjective:    Patient ID: Veronica Gibson, female    DOB: [birth year], 41 y.o.   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 Up on the 3rd floor mold was found.  She says she never feels good, except right after antibiotic treatment.  Currently using nettie pot  3 times a day with yellow discharge.  Nasal congestion started last Wednesday. Fever last night of  101.9.  Fevers started last Friday. Mild body aches, and sore throat. With swallowing hears popping in both ears, with left side worse. Sees  Dr. Orvil Feil for allergy injections usually once a week (gets two injections). Son Psychologist, prison and probation services) just sick with B influenza virus. Husband and son sick with A influenza during Jan 2018. She also feels she caught influenza A at that time.    Review of Systems fevers, st, headache, mild bodyaches, feels  lymph nodes are swolle Nasal congestion, runny nose. Mild cough Other review of systems negative.     Objective:   Physical Exam  non acute distress, does not look like she feels well. HEENT: nasal congestion, erythema of nares , left tm wnl , right appears red, pharynx erythema cobblestoning of posterior pharynx Lungs clear to auscultation bilaterally Heart:rrr no mumurs rubs or gallops. Adenopathy cervical chain.        Assessment & Plan:  Influenza most likely B  Tamiflu 75mg  one po bid x 5 days Hydrate, alternate ibuprofen with tylenol Communicated to patient she must be fever free x 48 hours wuth no motrin or tylenol, in order to return to work.

## 2016-12-03 DIAGNOSIS — J301 Allergic rhinitis due to pollen: Secondary | ICD-10-CM | POA: Diagnosis not present

## 2016-12-03 DIAGNOSIS — J3089 Other allergic rhinitis: Secondary | ICD-10-CM | POA: Diagnosis not present

## 2016-12-03 DIAGNOSIS — J3081 Allergic rhinitis due to animal (cat) (dog) hair and dander: Secondary | ICD-10-CM | POA: Diagnosis not present

## 2016-12-05 DIAGNOSIS — J3089 Other allergic rhinitis: Secondary | ICD-10-CM | POA: Diagnosis not present

## 2016-12-19 DIAGNOSIS — J301 Allergic rhinitis due to pollen: Secondary | ICD-10-CM | POA: Diagnosis not present

## 2016-12-19 DIAGNOSIS — J3089 Other allergic rhinitis: Secondary | ICD-10-CM | POA: Diagnosis not present

## 2016-12-19 DIAGNOSIS — J3081 Allergic rhinitis due to animal (cat) (dog) hair and dander: Secondary | ICD-10-CM | POA: Diagnosis not present

## 2016-12-24 DIAGNOSIS — J3081 Allergic rhinitis due to animal (cat) (dog) hair and dander: Secondary | ICD-10-CM | POA: Diagnosis not present

## 2016-12-24 DIAGNOSIS — J301 Allergic rhinitis due to pollen: Secondary | ICD-10-CM | POA: Diagnosis not present

## 2016-12-24 DIAGNOSIS — J3089 Other allergic rhinitis: Secondary | ICD-10-CM | POA: Diagnosis not present

## 2016-12-31 DIAGNOSIS — J3081 Allergic rhinitis due to animal (cat) (dog) hair and dander: Secondary | ICD-10-CM | POA: Diagnosis not present

## 2016-12-31 DIAGNOSIS — J3089 Other allergic rhinitis: Secondary | ICD-10-CM | POA: Diagnosis not present

## 2016-12-31 DIAGNOSIS — J301 Allergic rhinitis due to pollen: Secondary | ICD-10-CM | POA: Diagnosis not present

## 2017-01-07 DIAGNOSIS — J301 Allergic rhinitis due to pollen: Secondary | ICD-10-CM | POA: Diagnosis not present

## 2017-01-07 DIAGNOSIS — J3089 Other allergic rhinitis: Secondary | ICD-10-CM | POA: Diagnosis not present

## 2017-01-07 DIAGNOSIS — J3081 Allergic rhinitis due to animal (cat) (dog) hair and dander: Secondary | ICD-10-CM | POA: Diagnosis not present

## 2017-01-16 DIAGNOSIS — J3089 Other allergic rhinitis: Secondary | ICD-10-CM | POA: Diagnosis not present

## 2017-01-16 DIAGNOSIS — J301 Allergic rhinitis due to pollen: Secondary | ICD-10-CM | POA: Diagnosis not present

## 2017-01-16 DIAGNOSIS — J3081 Allergic rhinitis due to animal (cat) (dog) hair and dander: Secondary | ICD-10-CM | POA: Diagnosis not present

## 2017-01-23 DIAGNOSIS — J3081 Allergic rhinitis due to animal (cat) (dog) hair and dander: Secondary | ICD-10-CM | POA: Diagnosis not present

## 2017-01-23 DIAGNOSIS — J301 Allergic rhinitis due to pollen: Secondary | ICD-10-CM | POA: Diagnosis not present

## 2017-01-23 DIAGNOSIS — J3089 Other allergic rhinitis: Secondary | ICD-10-CM | POA: Diagnosis not present

## 2017-01-28 DIAGNOSIS — J3089 Other allergic rhinitis: Secondary | ICD-10-CM | POA: Diagnosis not present

## 2017-01-28 DIAGNOSIS — J301 Allergic rhinitis due to pollen: Secondary | ICD-10-CM | POA: Diagnosis not present

## 2017-01-28 DIAGNOSIS — J3081 Allergic rhinitis due to animal (cat) (dog) hair and dander: Secondary | ICD-10-CM | POA: Diagnosis not present

## 2017-02-04 DIAGNOSIS — F411 Generalized anxiety disorder: Secondary | ICD-10-CM | POA: Diagnosis not present

## 2017-02-06 DIAGNOSIS — J3089 Other allergic rhinitis: Secondary | ICD-10-CM | POA: Diagnosis not present

## 2017-02-06 DIAGNOSIS — J3081 Allergic rhinitis due to animal (cat) (dog) hair and dander: Secondary | ICD-10-CM | POA: Diagnosis not present

## 2017-02-06 DIAGNOSIS — J301 Allergic rhinitis due to pollen: Secondary | ICD-10-CM | POA: Diagnosis not present

## 2017-02-13 DIAGNOSIS — J3089 Other allergic rhinitis: Secondary | ICD-10-CM | POA: Diagnosis not present

## 2017-02-13 DIAGNOSIS — J301 Allergic rhinitis due to pollen: Secondary | ICD-10-CM | POA: Diagnosis not present

## 2017-02-13 DIAGNOSIS — J3081 Allergic rhinitis due to animal (cat) (dog) hair and dander: Secondary | ICD-10-CM | POA: Diagnosis not present

## 2017-02-21 DIAGNOSIS — J301 Allergic rhinitis due to pollen: Secondary | ICD-10-CM | POA: Diagnosis not present

## 2017-02-21 DIAGNOSIS — J3081 Allergic rhinitis due to animal (cat) (dog) hair and dander: Secondary | ICD-10-CM | POA: Diagnosis not present

## 2017-02-21 DIAGNOSIS — J3089 Other allergic rhinitis: Secondary | ICD-10-CM | POA: Diagnosis not present

## 2017-02-28 DIAGNOSIS — J3081 Allergic rhinitis due to animal (cat) (dog) hair and dander: Secondary | ICD-10-CM | POA: Diagnosis not present

## 2017-02-28 DIAGNOSIS — J301 Allergic rhinitis due to pollen: Secondary | ICD-10-CM | POA: Diagnosis not present

## 2017-02-28 DIAGNOSIS — J3089 Other allergic rhinitis: Secondary | ICD-10-CM | POA: Diagnosis not present

## 2017-03-11 DIAGNOSIS — J301 Allergic rhinitis due to pollen: Secondary | ICD-10-CM | POA: Diagnosis not present

## 2017-03-11 DIAGNOSIS — J3081 Allergic rhinitis due to animal (cat) (dog) hair and dander: Secondary | ICD-10-CM | POA: Diagnosis not present

## 2017-03-11 DIAGNOSIS — J3089 Other allergic rhinitis: Secondary | ICD-10-CM | POA: Diagnosis not present

## 2017-03-20 DIAGNOSIS — J301 Allergic rhinitis due to pollen: Secondary | ICD-10-CM | POA: Diagnosis not present

## 2017-03-20 DIAGNOSIS — J3089 Other allergic rhinitis: Secondary | ICD-10-CM | POA: Diagnosis not present

## 2017-03-20 DIAGNOSIS — J3081 Allergic rhinitis due to animal (cat) (dog) hair and dander: Secondary | ICD-10-CM | POA: Diagnosis not present

## 2017-03-25 DIAGNOSIS — J3081 Allergic rhinitis due to animal (cat) (dog) hair and dander: Secondary | ICD-10-CM | POA: Diagnosis not present

## 2017-03-25 DIAGNOSIS — J301 Allergic rhinitis due to pollen: Secondary | ICD-10-CM | POA: Diagnosis not present

## 2017-03-25 DIAGNOSIS — J3089 Other allergic rhinitis: Secondary | ICD-10-CM | POA: Diagnosis not present

## 2017-03-27 DIAGNOSIS — J3081 Allergic rhinitis due to animal (cat) (dog) hair and dander: Secondary | ICD-10-CM | POA: Diagnosis not present

## 2017-03-27 DIAGNOSIS — J301 Allergic rhinitis due to pollen: Secondary | ICD-10-CM | POA: Diagnosis not present

## 2017-03-28 DIAGNOSIS — J3089 Other allergic rhinitis: Secondary | ICD-10-CM | POA: Diagnosis not present

## 2017-04-08 DIAGNOSIS — J3081 Allergic rhinitis due to animal (cat) (dog) hair and dander: Secondary | ICD-10-CM | POA: Diagnosis not present

## 2017-04-08 DIAGNOSIS — J3089 Other allergic rhinitis: Secondary | ICD-10-CM | POA: Diagnosis not present

## 2017-04-08 DIAGNOSIS — J301 Allergic rhinitis due to pollen: Secondary | ICD-10-CM | POA: Diagnosis not present

## 2017-04-17 DIAGNOSIS — J301 Allergic rhinitis due to pollen: Secondary | ICD-10-CM | POA: Diagnosis not present

## 2017-04-17 DIAGNOSIS — J3081 Allergic rhinitis due to animal (cat) (dog) hair and dander: Secondary | ICD-10-CM | POA: Diagnosis not present

## 2017-04-17 DIAGNOSIS — J3089 Other allergic rhinitis: Secondary | ICD-10-CM | POA: Diagnosis not present

## 2017-04-22 DIAGNOSIS — J3089 Other allergic rhinitis: Secondary | ICD-10-CM | POA: Diagnosis not present

## 2017-04-22 DIAGNOSIS — J301 Allergic rhinitis due to pollen: Secondary | ICD-10-CM | POA: Diagnosis not present

## 2017-04-22 DIAGNOSIS — J3081 Allergic rhinitis due to animal (cat) (dog) hair and dander: Secondary | ICD-10-CM | POA: Diagnosis not present

## 2017-05-01 DIAGNOSIS — J301 Allergic rhinitis due to pollen: Secondary | ICD-10-CM | POA: Diagnosis not present

## 2017-05-01 DIAGNOSIS — J3081 Allergic rhinitis due to animal (cat) (dog) hair and dander: Secondary | ICD-10-CM | POA: Diagnosis not present

## 2017-05-01 DIAGNOSIS — J3089 Other allergic rhinitis: Secondary | ICD-10-CM | POA: Diagnosis not present

## 2017-05-06 ENCOUNTER — Encounter: Payer: Self-pay | Admitting: Obstetrics and Gynecology

## 2017-05-06 ENCOUNTER — Ambulatory Visit (INDEPENDENT_AMBULATORY_CARE_PROVIDER_SITE_OTHER): Payer: BLUE CROSS/BLUE SHIELD | Admitting: Obstetrics and Gynecology

## 2017-05-06 VITALS — BP 112/70 | Ht 66.0 in | Wt 134.0 lb

## 2017-05-06 DIAGNOSIS — Z1231 Encounter for screening mammogram for malignant neoplasm of breast: Secondary | ICD-10-CM

## 2017-05-06 DIAGNOSIS — Z1239 Encounter for other screening for malignant neoplasm of breast: Secondary | ICD-10-CM

## 2017-05-06 DIAGNOSIS — J3089 Other allergic rhinitis: Secondary | ICD-10-CM | POA: Diagnosis not present

## 2017-05-06 DIAGNOSIS — Z01419 Encounter for gynecological examination (general) (routine) without abnormal findings: Secondary | ICD-10-CM | POA: Diagnosis not present

## 2017-05-06 DIAGNOSIS — Z124 Encounter for screening for malignant neoplasm of cervix: Secondary | ICD-10-CM

## 2017-05-06 DIAGNOSIS — J301 Allergic rhinitis due to pollen: Secondary | ICD-10-CM | POA: Diagnosis not present

## 2017-05-06 DIAGNOSIS — Z1151 Encounter for screening for human papillomavirus (HPV): Secondary | ICD-10-CM | POA: Diagnosis not present

## 2017-05-06 DIAGNOSIS — J3081 Allergic rhinitis due to animal (cat) (dog) hair and dander: Secondary | ICD-10-CM | POA: Diagnosis not present

## 2017-05-06 NOTE — Progress Notes (Signed)
Chief Complaint  Patient presents with  . Annual Exam     HPI:      [name] is a 41 y.o. [phone] who LMP was Patient's last menstrual period was 04/19/2017., presents today for her annual examination.  Her menses are regular every 28-30 days, lasting 6 days.  Dysmenorrhea mild, occurring first 1-2 days of flow. She does not have intermenstrual bleeding.  Sex activity: single partner, contraception - vasectomy.  Last Pap: April 07, 2014  Results were: no abnormalities /neg HPV DNA  Hx of STDs: none  There is no FH of breast cancer. There is no FH of ovarian cancer. The patient does not do self-breast exams.  Tobacco use: The patient denies current or previous tobacco use. Alcohol use: social drinker Exercise: moderately active  She does get adequate calcium and Vitamin D in her diet.   Past Medical History:  Diagnosis Date  . Anxiety   . Head ache   . History of mammogram 12/13/2013   CAT 2  . History of Papanicolaou smear of cervix 04/07/2014   -/-  . Seasonal allergies     Past Surgical History:  Procedure Laterality Date  . WISDOM TOOTH EXTRACTION      Family History  Problem Relation Age of Onset  . Cancer Paternal Grandmother 31       UTERINE  . Coronary artery disease Paternal Grandfather        CABG    Social History   Social History  . Marital status: Married    Spouse name: N/A  . Number of children: 2  . Years of education: 67   Occupational History  . [employer]   Social History Main Topics  . Smoking status: Never Smoker  . Smokeless tobacco: Never Used  . Alcohol use Yes     Comment: OCC  . Drug use: No  . Sexual activity: Yes    Birth control/ protection: Surgical     Comment: VASECTOMY   Other Topics Concern  . Not on file   Social History Narrative  . No narrative on file     Current Outpatient Prescriptions:  .  escitalopram (LEXAPRO) 10 MG tablet, Take 10 mg by mouth daily., Disp: , Rfl:  .   fluticasone (FLONASE) 50 MCG/ACT nasal spray, PLACE 1-2 SPRAY IN EACH NOSTRIL ONCE A DAY, Disp: , Rfl: 3 .  loratadine (CLARITIN) 10 MG tablet, Take 10 mg by mouth 2 (two) times daily. , Disp: , Rfl:  .  mometasone (NASONEX) 50 MCG/ACT nasal spray, Place 2 sprays into the nose daily., Disp: , Rfl:  .  azelastine (ASTELIN) 0.1 % nasal spray, USE 1 TO 2 SPRAYS INTO EACH NOSTRIL TWICE A DAY, Disp: , Rfl: 3 .  DYMISTA 137-50 MCG/ACT SUSP, Place 2 sprays into the nose daily. Reported on 02/15/2016, Disp: , Rfl:   ROS:  Review of Systems  Constitutional: Negative for fatigue, fever and unexpected weight change.  Respiratory: Negative for cough, shortness of breath and wheezing.   Cardiovascular: Negative for chest pain, palpitations and leg swelling.  Gastrointestinal: Negative for blood in stool, constipation, diarrhea, nausea and vomiting.  Endocrine: Negative for cold intolerance, heat intolerance and polyuria.  Genitourinary: Negative for dyspareunia, dysuria, flank pain, frequency, genital sores, hematuria, menstrual problem, pelvic pain, urgency, vaginal bleeding, vaginal discharge and vaginal pain.  Musculoskeletal: Negative for back pain, joint swelling and myalgias.  Skin: Negative for rash.  Neurological: Negative for dizziness, syncope, light-headedness, numbness  and headaches.  Hematological: Negative for adenopathy.  Psychiatric/Behavioral: Negative for agitation, confusion, sleep disturbance and suicidal ideas. The patient is not nervous/anxious.      Objective: BP 112/70   Ht 5\' 6"  (1.676 m)   Wt 134 lb (60.8 kg)   LMP 04/19/2017   BMI 21.63 kg/m    Physical Exam  Constitutional: She is oriented to person, place, and time. She appears well-developed and well-nourished.  Genitourinary: Vagina normal and uterus normal. There is no rash or tenderness on the right labia. There is no rash or tenderness on the left labia. No erythema or tenderness in the vagina. No vaginal  discharge found. Right adnexum does not display mass and does not display tenderness. Left adnexum does not display mass and does not display tenderness. Cervix does not exhibit motion tenderness or polyp. Uterus is not enlarged or tender.  Neck: Normal range of motion. No thyromegaly present.  Cardiovascular: Normal rate, regular rhythm and normal heart sounds.   No murmur heard. Pulmonary/Chest: Effort normal and breath sounds normal. Right breast exhibits no mass, no nipple discharge, no skin change and no tenderness. Left breast exhibits no mass, no nipple discharge, no skin change and no tenderness.  Abdominal: Soft. There is no tenderness. There is no guarding.  Musculoskeletal: Normal range of motion.  Neurological: She is alert and oriented to person, place, and time. No cranial nerve deficit.  Psychiatric: She has a normal mood and affect. Her behavior is normal.  Vitals reviewed.    Assessment/Plan: Encounter for annual routine gynecological examination  Cervical cancer screening - Plan: IGP, Aptima HPV  Screening for HPV (human papillomavirus) - Plan: IGP, Aptima HPV  Screening for breast cancer - Pt to sched mammo - Plan: MM DIGITAL SCREENING BILATERAL             GYN counsel mammography screening, adequate intake of calcium and vitamin D     F/U  Return in about 1 year (around 05/06/2018).  Jilliane Kazanjian B. Jrue Jarriel, PA-C 05/06/2017 12:09 PM

## 2017-05-06 NOTE — Patient Instructions (Signed)
[facility]: [phone]  [facility]: [phone] 

## 2017-05-08 LAB — IGP, APTIMA HPV
HPV Aptima: NEGATIVE
PAP Smear Comment: 0

## 2017-05-15 DIAGNOSIS — J301 Allergic rhinitis due to pollen: Secondary | ICD-10-CM | POA: Diagnosis not present

## 2017-05-15 DIAGNOSIS — J3089 Other allergic rhinitis: Secondary | ICD-10-CM | POA: Diagnosis not present

## 2017-05-15 DIAGNOSIS — J3081 Allergic rhinitis due to animal (cat) (dog) hair and dander: Secondary | ICD-10-CM | POA: Diagnosis not present

## 2017-05-20 DIAGNOSIS — J3089 Other allergic rhinitis: Secondary | ICD-10-CM | POA: Diagnosis not present

## 2017-05-20 DIAGNOSIS — J301 Allergic rhinitis due to pollen: Secondary | ICD-10-CM | POA: Diagnosis not present

## 2017-05-20 DIAGNOSIS — J3081 Allergic rhinitis due to animal (cat) (dog) hair and dander: Secondary | ICD-10-CM | POA: Diagnosis not present

## 2017-05-20 DIAGNOSIS — F411 Generalized anxiety disorder: Secondary | ICD-10-CM | POA: Diagnosis not present

## 2017-05-22 ENCOUNTER — Encounter: Payer: Self-pay | Admitting: Adult Health

## 2017-05-22 ENCOUNTER — Ambulatory Visit: Payer: BLUE CROSS/BLUE SHIELD | Admitting: Adult Health

## 2017-05-22 VITALS — BP 116/72 | HR 80 | Temp 99.4°F | Resp 18 | Ht 66.0 in | Wt 119.0 lb

## 2017-05-22 DIAGNOSIS — Z708 Other sex counseling: Secondary | ICD-10-CM

## 2017-05-22 DIAGNOSIS — A749 Chlamydial infection, unspecified: Secondary | ICD-10-CM

## 2017-05-22 NOTE — Patient Instructions (Addendum)
Living With Anxiety After being diagnosed with an anxiety disorder, you may be relieved to know why you have felt or behaved a certain way. It is natural to also feel overwhelmed about the treatment ahead and what it will mean for your life. With care and support, you can manage this condition and recover from it. How to cope with anxiety Dealing with stress Stress is your body's reaction to life changes and events, both good and bad. Stress can last just a few hours or it can be ongoing. Stress can play a major role in anxiety, so it is important to learn both how to cope with stress and how to think about it differently. Talk with your health care provider or a counselor to learn more about stress reduction. He or she may suggest some stress reduction techniques, such as:  Music therapy. This can include creating or listening to music that you enjoy and that inspires you.  Mindfulness-based meditation. This involves being aware of your normal breaths, rather than trying to control your breathing. It can be done while sitting or walking.  Centering prayer. This is a kind of meditation that involves focusing on a word, phrase, or sacred image that is meaningful to you and that brings you peace.  Deep breathing. To do this, expand your stomach and inhale slowly through your nose. Hold your breath for 3-5 seconds. Then exhale slowly, allowing your stomach muscles to relax.  Self-talk. This is a skill where you identify thought patterns that lead to anxiety reactions and correct those thoughts.  Muscle relaxation. This involves tensing muscles then relaxing them.  Choose a stress reduction technique that fits your lifestyle and personality. Stress reduction techniques take time and practice. Set aside 5-15 minutes a day to do them. Therapists can offer training in these techniques. The training may be covered by some insurance plans. Other things you can do to manage stress include:  Keeping a  stress diary. This can help you learn what triggers your stress and ways to control your response.  Thinking about how you respond to certain situations. You may not be able to control everything, but you can control your reaction.  Making time for activities that help you relax, and not feeling guilty about spending your time in this way.  Therapy combined with coping and stress-reduction skills provides the best chance for successful treatment. Medicines Medicines can help ease symptoms. Medicines for anxiety include:  Anti-anxiety drugs.  Antidepressants.  Beta-blockers.  Medicines may be used as the main treatment for anxiety disorder, along with therapy, or if other treatments are not working. Medicines should be prescribed by a health care provider. Relationships Relationships can play a big part in helping you recover. Try to spend more time connecting with trusted friends and family members. Consider going to couples counseling, taking family education classes, or going to family therapy. Therapy can help you and others better understand the condition. How to recognize changes in your condition Everyone has a different response to treatment for anxiety. Recovery from anxiety happens when symptoms decrease and stop interfering with your daily activities at home or work. This may mean that you will start to:  Have better concentration and focus.  Sleep better.  Be less irritable.  Have more energy.  Have improved memory.  It is important to recognize when your condition is getting worse. Contact your health care provider if your symptoms interfere with home or work and you do not feel like your condition  is improving. Where to find help and support: You can get help and support from these sources:  Self-help groups.  Online and [employer].  A trusted spiritual leader.  Couples counseling.  Family education classes.  Family therapy.  Follow these  instructions at home:  Eat a healthy diet that includes plenty of vegetables, fruits, whole grains, low-fat dairy products, and lean protein. Do not eat a lot of foods that are high in solid fats, added sugars, or salt.  Exercise. Most adults should do the following: ? Exercise for at least 150 minutes each week. The exercise should increase your heart rate and make you sweat (moderate-intensity exercise). ? Strengthening exercises at least twice a week.  Cut down on caffeine, tobacco, alcohol, and other potentially harmful substances.  Get the right amount and quality of sleep. Most adults need 7-9 hours of sleep each night.  Make choices that simplify your life.  Take over-the-counter and prescription medicines only as told by your health care provider.  Avoid caffeine, alcohol, and certain over-the-counter cold medicines. These may make you feel worse. Ask your pharmacist which medicines to avoid.  Keep all follow-up visits as told by your health care provider. This is important. Questions to ask your health care provider  Would I benefit from therapy?  How often should I follow up with a health care provider?  How long do I need to take medicine?  Are there any long-term side effects of my medicine?  Are there any alternatives to taking medicine? Contact a health care provider if:  You have a hard time staying focused or finishing daily tasks.  You spend many hours a day feeling worried about everyday life.  You become exhausted by worry.  You start to have headaches, feel tense, or have nausea.  You urinate more than normal.  You have diarrhea. Get help right away if:  You have a racing heart and shortness of breath.  You have thoughts of hurting yourself or others. If you ever feel like you may hurt yourself or others, or have thoughts about taking your own life, get help right away. You can go to your nearest emergency department or call:  Your local emergency  services (911 in the [place]  A suicide crisis helpline, such as the [place] at (407)864-6101. This is open 24-hours a day.  Summary  Taking steps to deal with stress can help calm you.  Medicines cannot cure anxiety disorders, but they can help ease symptoms.  Family, friends, and partners can play a big part in helping you recover from an anxiety disorder. This information is not intended to replace advice given to you by your health care provider. Make sure you discuss any questions you have with your health care provider. Document Released: 09/03/2016 Document Revised: 09/03/2016 Document Reviewed: 09/03/2016 Elsevier Interactive Patient Education  2018 [place] Sex Practicing safe sex means taking steps before and during sex to reduce your risk of:  Getting an STD (sexually transmitted disease).  Giving your partner an STD.  Unwanted pregnancy.  How can I practice safe sex?  To practice safe sex:  Limit your sexual partners to only one partner who is having sex with only you.  Avoid using alcohol and recreational drugs before having sex. These substances can affect your judgment.  Before having sex with a new partner: ? Talk to your partner about past partners, past STDs, and drug use. ? You and your partner should be screened  for STDs and discuss the results with each other.  Check your body regularly for sores, blisters, rashes, or unusual discharge. If you notice any of these problems, visit your health care provider.  If you have symptoms of an infection or you are being treated for an STD, avoid sexual contact.  While having sex, use a condom. Make sure to: ? Use a condom every time you have vaginal, oral, or anal sex. Both females and males should wear condoms during oral sex. ? Keep condoms in place from the beginning to the end of sexual activity. ? Use a latex condom, if possible. Latex condoms offer the best  protection. ? Use only water-based lubricants or oils to lubricate a condom. Using petroleum-based lubricants or oils will weaken the condom and increase the chance that it will break.  See your health care provider for regular screenings, exams, and tests for STDs.  Talk with your health care provider about the form of birth control (contraception) that is best for you.  Get vaccinated against hepatitis B and human papillomavirus (HPV).  If you are at risk of being infected with HIV (human immunodeficiency virus), talk with your health care provider about taking a prescription medicine to prevent HIV infection. You are considered at risk for HIV if: ? You are a man who has sex with other men. ? You are a heterosexual man or woman who is sexually active with more than one partner. ? You take drugs by injection. ? You are sexually active with a partner who has HIV.  This information is not intended to replace advice given to you by your health care provider. Make sure you discuss any questions you have with your health care provider. Document Released: 10/17/2004 Document Revised: 01/24/2016 Document Reviewed: 07/30/2015 Elsevier Interactive Patient Education  [employer]. Gonorrhea Testing Gonorrhea is a bacterial infection that spreads from person to person through sexual contact. It can also spread from a mother to her baby during childbirth. You may be tested for gonorrhea if:  You have symptoms of gonorrhea.  You are pregnant or plan to get pregnant.  You have multiple sexual partners.  There are three gonorrhea tests:  Nucleic acid amplification test. For this test, you will provide a urine sample or your health care provider will use a swab to collect a fluid sample from the area of possible infection.  Nucleic acid hybridization test. For this test, your health care provider will use a swab to collect a fluid sample from the penis or vagina.  Gonorrhea culture. For this  test, your health care provider will use a swab to collect a sample of bodily fluid from the area of possible infection. This test may be done in cases of sexual assault for legal reasons.  In all three methods of testing, the sample is sent to a laboratory for analysis. How do I prepare for this test?  Let your health care provider know if you are taking antibiotic medicine.  Do not use vaginal creams or douches.  Try to arrive with a full bladder. What do the results mean? It is your responsibility to obtain your test results. Ask the lab or department performing the test when and how you will get your results. Contact your health care provider to discuss any questions you have about your results. The results of your gonorrhea test will either be negative or positive. Meaning of Negative Test Results If your test result is negative, then it is most likely  that you do not have the disease. Meaning of Positive Test Results If your test result is positive, you have an active gonorrhea infection. Notify any sexual partners so that they can also be tested. Talk with your health care provider to discuss your results, treatment options, and if necessary, the need for more tests. Talk with your health care provider if you have any questions about your results. This information is not intended to replace advice given to you by your health care provider. Make sure you discuss any questions you have with your health care provider. Document Released: 10/11/2004 Document Revised: 05/14/2016 Document Reviewed: 12/13/2013 Elsevier Interactive Patient Education  2017 [employer] Genital Warts Genital warts are small growths in the genital area or anal area. They are caused by a type of germ (HPV virus). This germ is spread from person to person during sex. It can be spread through vaginal, anal, and oral sex. Genital warts can lead to other problems if they are not treated. Follow these instructions at  home: Medicines  Apply over-the-counter and prescription medicines only as told by your doctor.  Do not use medicines that are meant for treating hand warts.  Talk with your doctor about using anti-itch creams. General instructions  Do not touch or scratch the warts.  Do not have sex until your treatment is done.  Tell your current and past sexual partners about your condition. They may need treatment.  Keep all follow-up visits as told by your doctor. This is important.  After treatment, use condoms during sex. Other Instructions for Women  Women who have genital warts may need to be checked more often for cervical cancer.  If you become pregnant, tell your doctor that you have had genital warts. The germ can be passed to the baby. Contact a doctor if:  You have redness, swelling, or pain in the area of the treated skin.  You have a fever.  You feel generally sick.  You feel lumps in and around your genital area or anal area.  You have bleeding in your genital area or anal area.  You have pain during sex. This information is not intended to replace advice given to you by your health care provider. Make sure you discuss any questions you have with your health care provider. Document Released: 12/04/2009 Document Revised: 02/15/2016 Document Reviewed: 12/05/2014 Elsevier Interactive Patient Education  2018 [employer]. Chlamydia, Female Chlamydia is an STD (sexually transmitted disease). It is a bacterial infection that spreads (is contagious) through sexual contact. Chlamydia can occur in different areas of the body, including:  The tube that moves urine from the bladder out of the body (urethra).  The lower part of the uterus (cervix).  The throat.  The rectum.  This condition is not difficult to treat. However, if left untreated, chlamydia can lead to more serious health problems, including pelvic inflammatory disorder (PID). PID can increase your risk of not  being able to have children (sterility). What are the causes? Chlamydia is caused by the bacteria Chlamydia trachomatis. It is passed from an infected partner during sexual activity. Chlamydia can spread through contact with the genitals, mouth, or rectum. What are the signs or symptoms? In some cases, there may not be any symptoms for this condition (asymptomatic), especially early in the infection. If symptoms develop, they may include:  Burning with urination.  Frequent urination.  Vaginal discharge.  Redness, soreness, and swelling (inflammation) of the rectum.  Bleeding or discharge from the rectum.  Abdominal pain.  Pain during sexual intercourse.  Bleeding between menstrual periods.  Itching, burning, or redness in the eyes, or discharge from the eyes.  How is this diagnosed? This condition may be diagnosed with:  Urine tests.  Swab tests. Depending on your symptoms, your health care provider may use a cotton swab to collect discharge from your vagina or rectum to test for the bacteria.  A pelvic exam.  How is this treated? This condition is treated with antibiotic medicines. If you are pregnant, certain types of antibiotics will need to be avoided. Follow these instructions at home: Medicines  Take over-the-counter and prescription medicines only as told by your health care provider.  Take your antibiotic medicine as told by your health care provider. Do not stop taking the antibiotic even if you start to feel better. Sexual activity  Tell sexual partners about your infection. This includes any oral, anal, or vaginal sex partners you have had within 60 days of when your symptoms started. Sexual partners should also be treated, even if they have no signs of the disease.  Do not have sex until you and your sexual partners have completed treatment and your health care provider says it is okay. If your health care provider prescribed you a single dose treatment, wait  7 days after taking the treatment before having sex. General instructions  It is your responsibility to get your test results. Ask your health care provider, or the department performing the test, when your results will be ready.  Get plenty of rest.  Eat a healthy, well-balanced diet.  Drink enough fluids to keep your urine clear or pale yellow.  Keep all follow-up visits as told by your health care provider. This is important. You may need to be tested for infection again 3 months after treatment. How is this prevented? The only sure way to prevent chlamydia is to avoid having sex. However, you can lower your risk by:  Using latex condoms correctly every time you have sex.  Not having multiple sexual partners.  Asking if your sexual partner has been tested for STIs and had negative results.  Contact a health care provider if:  You develop new symptoms or your symptoms do not get better after completing treatment.  You have a fever or chills.  You have pain during sexual intercourse. Get help right away if:  Your pain gets worse and does not get better with medicine.  You develop flu-like symptoms, such as night sweats, sore throat, or muscle aches.  You experience nausea or vomiting.  You have difficulty swallowing.  You have bleeding between periods or after sex.  You have irregular menstrual periods.  You have abdominal or lower back pain that does not get better with medicine.  You feel weak or dizzy, or you faint.  You are pregnant and you develop symptoms of chlamydia. Summary  Chlamydia is an STD (sexually transmitted disease). It is a bacterial infection that spreads (is contagious) through sexual contact.  This condition is not difficult to treat, however. If left untreated, chlamydia can lead to more serious health problems, including pelvic inflammatory disease (PID).  In some cases, there may not be any symptoms for this condition  (asymptomatic).  This condition is treated with antibiotic medicines.  Using latex condoms correctly every time you have sex can help prevent chlamydia. This information is not intended to replace advice given to you by your health care provider. Make sure you discuss any questions you have with  your health care provider. Document Released: 06/19/2005 Document Revised: 08/26/2016 Document Reviewed: 08/26/2016 Elsevier Interactive Patient Education  2017 [employer]. Syphilis Syphilis is an infectious disease. It can cause serious complications if left untreated. What are the causes? Syphilis is caused by a type of bacteria called Treponema pallidum. It is most commonly spread through sexual contact. Syphilis may also spread to a fetus through the blood of the mother. What are the signs or symptoms? Symptoms vary depending on the stage of the disease. Some symptoms may disappear without treatment. However, this does not mean that the infection is gone. One form of syphilis (called latent syphilis) has no symptoms. Primary Syphilis  Painless sores (chancres) in and around the genital organs and mouth.  Swollen lymph nodes near the sores. Secondary Syphilis  A rash or sores over any portion of the body, including the palms of the hands and soles of the feet.  Fever.  Headache.  Sore throat.  Swollen lymph nodes.  New sores in the mouth or on the genitals.  Feeling generally ill.  Having pain in the joints. Tertiary Syphilis The third stage of syphilis involves severe damage to different organs in the body, such as the brain, spinal cord, and heart. Signs and symptoms may include:  Dementia.  Personality and mood changes.  Difficulty walking.  Heart failure.  Fainting.  Enlargement (aneurysm) of the aorta.  Tumors of the skin, bones, or liver.  Muscle weakness.  Sudden "lightning" pains, numbness, or tingling.  Problems with coordination.  Vision  changes.  How is this diagnosed?  A physical exam will be done.  Blood tests will be done to confirm the diagnosis.  If the disease is in the first or second stages, a fluid (drainage) sample from a sore or rash may be examined under a microscope to detect the disease-causing bacteria.  Fluid around the spine may need to be examined to detect brain damage or inflammation of the brain lining (meningitis).  If the disease is in the third stage, X-rays, CT scans, MRIs, echocardiograms, ultrasounds, or cardiac catheterization may also be done to detect disease of the heart, aorta, or brain. How is this treated? Syphilis can be cured with antibiotic medicine if a diagnosis is made early. During the first day of treatment, you may experience fever, chills, headache, nausea, or aching all over your body. This is a normal reaction to the antibiotics. Follow these instructions at home:  Take your antibiotic medicine as directed by your health care provider. Finish the antibiotic even if you start to feel better. Incomplete treatment will put you at risk for continued infection and could be life threatening.  Take medicines only as directed by your health care provider.  Do not have sexual intercourse until your treatment is completed or as directed by your health care provider.  Inform your recent sexual partners that you were diagnosed with syphilis. They need to seek care and treatment, even if they have no symptoms. It is necessary that all your sexual partners be tested for infection and treated if they have the disease.  Keep all follow-up visits as directed by your health care provider. It is important to keep all your appointments.  If your test results are not ready during your visit, make an appointment with your health care provider to find out the results. Do not assume everything is normal if you have not heard from your health care provider or the medical facility. It is your  responsibility to get  your test results. Contact a health care provider if:  You continue to have any of the following 24 hours after beginning treatment: ? Fever. ? Chills. ? Headache. ? Nausea. ? Aching all over your body.  You have symptoms of an allergic reaction to medicine, such as: ? Chills. ? A headache. ? Light-headedness. ? A new rash (especially hives). ? Difficulty breathing. This information is not intended to replace advice given to you by your health care provider. Make sure you discuss any questions you have with your health care provider. Document Released: 06/30/2013 Document Revised: 02/15/2016 Document Reviewed: 03/30/2013 Elsevier Interactive Patient Education  2017 [place] is a sexually transmitted disease (STD) that can affect both men and women. If left untreated, this infection can:  Damage the female or female organs.  Cause women and men to be unable to have children (be sterile).  Harm a fetus if an infected woman is pregnant.  It is important to get treatment for gonorrhea as soon as possible. It is also necessary for all of your sexual partners to be tested for the infection. What are the causes? This condition is caused by bacteria called Neisseria gonorrhoeae. The infection is spread from person to person through sexual contact, including oral, anal, and vaginal sex. A newborn can contract the infection from his or her mother during birth. What increases the risk? The following factors may make you more likely to develop this condition:  Being a woman who is younger than 40 years of age and who is sexually active.  Being a woman 77 years of age or older who has: ? A new sex partner. ? More than one sex partner. ? A sex partner who has an STD.  Being a man who has: ? A new sex partner. ? More than one sex partner. ? A sex partner who has an STD.  Using condoms inconsistently.  Currently having, or having  previously had, an STD.  Exchanging sex for money or drugs.  What are the signs or symptoms? Some people do not have any symptoms. If you do have symptoms, they may be different for females and males. For females  Pain in the lower abdomen.  Abnormal vaginal discharge. The discharge may be cloudy, thick, or yellow-green in color.  Bleeding between periods.  Painful sex.  Burning or itching in and around the vagina.  Pain or burning when urinating.  Irritation, pain, bleeding, or discharge from the rectum. This may occur if the infection was spread by anal sex.  Sore throat or swollen lymph nodes in the neck. This may occur if the infection was spread by oral sex. For males  Abnormal discharge from the penis. This discharge may be cloudy, thick, or yellow-green in color.  Pain or burning during urination.  Pain or swelling in the testicles.  Irritation, pain, bleeding, or discharge from the rectum. This may occur if the infection was spread by anal sex.  Sore throat, fever, or swollen lymph nodes in the neck. This may occur if the infection was spread by oral sex. How is this diagnosed? This condition is diagnosed based on:  A physical exam.  A sample of discharge that is examined under a microscope to look for the bacteria. The discharge may be taken from the urethra, cervix, throat, or rectum.  Urine tests.  Not all of test results will be available during your visit. How is this treated? This condition is treated with antibiotic medicines. It is important  for treatment to begin as soon as possible. Early treatment may prevent some problems from developing. Do not have sex during treatment. Avoid all types of sexual activity for 7 days after treatment is complete and until any sex partners have been treated. Follow these instructions at home:  Take over-the-counter and prescription medicines only as told by your health care provider.  Take your antibiotic medicine  as told by your health care provider. Do not stop taking the antibiotic even if you start to feel better.  Do not have sex until at least 7 days after you and your partner(s) have finished treatment and your health care provider says it is okay.  It is your responsibility to get your test results. Ask your health care provider, or the department performing the test, when your results will be ready.  If you test positive for gonorrhea, inform your recent sexual partners. This includes any oral, anal, or vaginal sex partners. They need to be checked for gonorrhea even if they do not have symptoms. They may need treatment, even if they test negative for gonorrhea.  Keep all follow-up visits as told by your health care provider. This is important. How is this prevented?  Use latex condoms correctly every time you have sexual intercourse.  Ask if your sexual partner has been tested for STDs and had negative results.  Avoid having multiple sexual partners. Contact a health care provider if:  You develop a bad reaction to the medicine you were prescribed. This may include: ? A rash. ? Nausea. ? Vomiting. ? Diarrhea.  Your symptoms do not get better after a few days of taking antibiotics.  Your symptoms get worse.  You develop new symptoms.  Your pain gets worse.  You have a fever.  You develop pain, itching, or discharge around the eyes. Get help right away if:  You feel dizzy or faint.  You have trouble breathing or have shortness of breath.  You develop an irregular heartbeat.  You have severe abdominal pain with or without shoulder pain.  You develop any bumps or sores (lesions) on your skin.  You develop warmth, redness, pain, or swelling around your joints, such as the knee. Summary  Gonorrhea is an STDthat can affect both men and women.  This condition is caused by bacteria called Neisseria gonorrhoeae. The infection is spread from person to person, usually through  sexual contact, including oral, anal, and vaginal sex.  Symptoms vary between males and females. Generally, they include abnormal discharge and burning during urination. Women may also experience painful sex, itching around the vagina, and bleeding between menstrual periods. Men may also experience swelling of the testicles.  This condition is treated with antibiotic medicines. Do not have sex until at least 7 days after completing antibiotic treatment.  If left untreated, gonorrhea can have serious side effects and complications. This information is not intended to replace advice given to you by your health care provider. Make sure you discuss any questions you have with your health care provider. Document Released: 09/06/2000 Document Revised: 08/09/2016 Document Reviewed: 08/09/2016 Elsevier Interactive Patient Education  2017 Elsevier Inc. HIV Antibody Test The HIV antibody test is used to screen for the human immunodeficiency virus (HIV), the virus that causes AIDS. This test detects the proteins (antibodies) that your body's defense system (immune system) makes to fight the infection. Having antibodies to HIV does not mean you have AIDS or will get AIDS. HIV tests can be performed in a clinic, lab, or  hospital setting. There are also home testing kits. It is possible to have HIV without any symptoms. There is no cure for HIV, but starting treatment early helps you stay healthy longer. If you know you have HIV (HIV-positive), you can also make any changes to prevent infecting others. The Centers for Disease Control and Prevention (CDC) recommends that everyone between the ages of 21 and 3 have this test at least once. Your health care provider may recommend this test if:  You are pregnant or planning on becoming pregnant.  You have had sex with someone who is or might be HIV-positive.  You have had unprotected sex with more than one partner.  You are a Economist and were exposed to  HIV-infected blood.  You have ever injected illegal drugs.  You have ever exchanged sex for money or drugs.  You have been diagnosed with hepatitis, tuberculosis, or a sexually transmitted infection (STI).  You are a man who has sex with other men (MSM).  You are exhibiting symptoms of AIDS.  You had a blood transfusion before 1985.  How do I prepare for this test? The HIV screening requires either a blood sample or a sample of the fluid from inside your mouth (oral fluid).  For the blood test, a blood sample is drawn from a vein in your hand or arm.  For the test using oral fluid, your upper and lower gums are swabbed.  If you decide to do a home HIV test, follow the package instructions carefully. There are two types of home testing kits:  Blood test. You will send your test kit including a small sample of your blood to the manufacturer for processing. You will receive your results from the manufacturer.  Oral fluid test. This test is completed at home and usually gives you results in 20-40 minutes.  What do the results mean? It is your responsibility to obtain your test results. Ask the lab or department performing the test when and how you will get your results. Contact your health care provider to discuss any questions you have about your results. The results of the HIV screening test will either be positive or negative. Meaning of Negative Test Results If your HIV antibody test is negative, it means there were no antibodies in your blood at the time of the test. It is important to know that it can take 1-3 months to develop antibodies after infection. If you may have been infected within the previous 3 months, you should repeat the test after that time period. Meaning of Positive Test Results If your HIV antibody test is positive, it means the test detected antibodies to HIV in your sample. You will need to have another type of test to confirm the results of the initial test. A  positive result to the HIV antibody test does not mean you will develop AIDS. If you have completed a home test kit and the results are positive, contact your health care provider for repeat testing to confirm your results. Talk with your health care provider to discuss your results, treatment options, and if necessary, the need for more tests. Talk with your health care provider if you have any questions about your results. This information is not intended to replace advice given to you by your health care provider. Make sure you discuss any questions you have with your health care provider. Document Released: 09/06/2000 Document Revised: 05/15/2016 Document Reviewed: 12/13/2013 Elsevier Interactive Patient Education  2017 [employer].

## 2017-05-22 NOTE — Progress Notes (Signed)
Subjective:     Patient ID: Veronica Gibson, female   DOB: 11-Oct-1975, 41 y.o.   MRN: [id number]  HPI Patient is a 41 year old female who presents to the clinic in no acute distress who presents to the clinic with reports that her husband just returned from a 2 week trip and he told patient he met someone and was moving out. She reports she  initiated sex with her  husband  And he and herself had oral sex without a condom and vaginal sex with a condom. She reports her husband told her he was not intimate with the other lady but she does not trust him. Children are 50 and 33 years old.  She has  Alprazolam 0.25 mg taking  1/2 tablet in am and 1/2 tablet at night. Seeing Therapist through [facility] for this.She has an appointment at one today.   She reports she was seen last month for PAP smear and Pelvic with her Gynecologist. She denies any pelvic pain.  She denies any genital lesions, scratches, trauma or sores. Se denies any anal sex and did not participate in any rough or forceful sex. She denies any oral /facial lesions.  She is asymptomatic. Marland Kitchen She denies any suicidal or homicidal ideations. She reports she has to be strong for her two children.   Allergies  Allergen Reactions  . Dust Mite Mixed Allergen Ext [Mite (D. Farinae)] Other (See Comments)    And mold  . External Vehicles   . Shellfish Allergy     Patient Active Problem List   Diagnosis Date Noted  . Metatarsalgia of both feet 09/29/2013  . Patellar tendinitis 09/29/2013  . Abnormality of gait 03/24/2013  . 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 TENDINITIS, TIBIALIS 04/03/2007  . KNEE PAIN, RIGHT 03/13/2007   LMP : 05/21/17- normal menstruation  flow per her report Blood pressure 116/72, pulse 80, temperature 99.4 F (37.4 C), temperature source Tympanic, resp. rate 18, height 5' 6"  (1.676 m), weight 119 lb (54 kg), SpO2 98 %.  Recheck temperature 97.6 tympanic   Review of Systems  Constitutional: Negative.   HENT:  Negative.   Eyes: Negative.   Respiratory: Negative.   Cardiovascular: Negative.   Gastrointestinal: Negative.   Endocrine: Negative.   Genitourinary: Negative.   Musculoskeletal: Negative.   Allergic/Immunologic: Negative.   Neurological: Negative.   Hematological: Negative.   Psychiatric/Behavioral: Negative.        She is tearful when speaking of husband.        Objective:   Physical Exam  Constitutional: She is oriented to person, place, and time. She appears well-developed and well-nourished.  Cardiovascular: Normal rate, regular rhythm and intact distal pulses.  Exam reveals no gallop and no friction rub.   No murmur heard. Pulmonary/Chest: Effort normal and breath sounds normal.  Abdominal: Soft. Bowel sounds are normal. She exhibits no distension and no mass. There is no tenderness. There is no rebound and no guarding.  Musculoskeletal: Normal range of motion. She exhibits no edema, tenderness or deformity.  Neurological: She is alert and oriented to person, place, and time. She has normal reflexes.  Skin: Skin is warm and dry. She is not diaphoretic.  Vitals reviewed.      Assessment:     Sexually transmitted disease counseling      Plan:     Orders Placed This Encounter  Procedures  . GC/Chlamydia Probe Amp  . HIV antibody  . RPR Qual   Will call with results  if patient has not received call in one week please call the office.  Patient after counseling would like to be tested as above. She will see her Gynecologist for appointment if any pelvic pain or new vaginal symptoms arise( no pelvic exams currently done in this office at this time). She is to call the clinic if any new symptoms develop or she has any questions or concerns.   Patient verbalized understanding of instructions and denies any further questions at this time.

## 2017-05-24 LAB — GC/CHLAMYDIA PROBE AMP
Chlamydia trachomatis, NAA: NEGATIVE
Neisseria gonorrhoeae by PCR: NEGATIVE

## 2017-05-24 LAB — HIV ANTIBODY (ROUTINE TESTING W REFLEX): HIV Screen 4th Generation wRfx: NONREACTIVE

## 2017-05-26 ENCOUNTER — Telehealth: Payer: Self-pay | Admitting: Medical

## 2017-05-26 NOTE — Telephone Encounter (Signed)
Called patient left message for her to call clinic for test results of  GC/Chlamydia (both negative.)

## 2017-05-28 ENCOUNTER — Telehealth: Payer: Self-pay | Admitting: Medical

## 2017-05-28 ENCOUNTER — Telehealth: Payer: Self-pay

## 2017-05-28 NOTE — Telephone Encounter (Signed)
Patient returned my phone call 05/27/2017 .  Reviewed with patient that her GC/Chlamydia are both negative. No further questions at this time.

## 2017-05-28 NOTE — Telephone Encounter (Signed)
Called pt to inform of G&C results. Pt is aware that both tests are negative and will return to the clinic if sx persist.

## 2017-05-29 DIAGNOSIS — J301 Allergic rhinitis due to pollen: Secondary | ICD-10-CM | POA: Diagnosis not present

## 2017-05-29 DIAGNOSIS — J3081 Allergic rhinitis due to animal (cat) (dog) hair and dander: Secondary | ICD-10-CM | POA: Diagnosis not present

## 2017-05-29 DIAGNOSIS — J3089 Other allergic rhinitis: Secondary | ICD-10-CM | POA: Diagnosis not present

## 2017-05-29 NOTE — Progress Notes (Signed)
Gonorrhea and Chlamydia negative patient was informed by Durward Mallard

## 2017-06-03 DIAGNOSIS — J3081 Allergic rhinitis due to animal (cat) (dog) hair and dander: Secondary | ICD-10-CM | POA: Diagnosis not present

## 2017-06-03 DIAGNOSIS — J301 Allergic rhinitis due to pollen: Secondary | ICD-10-CM | POA: Diagnosis not present

## 2017-06-03 DIAGNOSIS — J3089 Other allergic rhinitis: Secondary | ICD-10-CM | POA: Diagnosis not present

## 2017-06-05 DIAGNOSIS — F411 Generalized anxiety disorder: Secondary | ICD-10-CM | POA: Diagnosis not present

## 2017-06-10 DIAGNOSIS — J301 Allergic rhinitis due to pollen: Secondary | ICD-10-CM | POA: Diagnosis not present

## 2017-06-10 DIAGNOSIS — J3081 Allergic rhinitis due to animal (cat) (dog) hair and dander: Secondary | ICD-10-CM | POA: Diagnosis not present

## 2017-06-10 DIAGNOSIS — J3089 Other allergic rhinitis: Secondary | ICD-10-CM | POA: Diagnosis not present

## 2017-06-11 ENCOUNTER — Telehealth: Payer: Self-pay | Admitting: Adult Health

## 2017-06-11 DIAGNOSIS — Z202 Contact with and (suspected) exposure to infections with a predominantly sexual mode of transmission: Secondary | ICD-10-CM

## 2017-06-11 NOTE — Telephone Encounter (Signed)
06/11/17 RPR was lost by [facility] per Emi Belfast RN. Patient called and will call for lab appointment. She will repeat HIV testing in 6 months per guidelines.  Patient verbalized understanding of instructions and denies any further questions at this time.   Orders Placed This Encounter  Procedures  . RPR Qual

## 2017-06-13 ENCOUNTER — Other Ambulatory Visit: Payer: BLUE CROSS/BLUE SHIELD

## 2017-06-13 DIAGNOSIS — Z202 Contact with and (suspected) exposure to infections with a predominantly sexual mode of transmission: Secondary | ICD-10-CM

## 2017-06-14 LAB — RPR QUALITATIVE: RPR Ser Ql: NONREACTIVE

## 2017-06-17 DIAGNOSIS — J3081 Allergic rhinitis due to animal (cat) (dog) hair and dander: Secondary | ICD-10-CM | POA: Diagnosis not present

## 2017-06-17 DIAGNOSIS — J301 Allergic rhinitis due to pollen: Secondary | ICD-10-CM | POA: Diagnosis not present

## 2017-06-17 DIAGNOSIS — J3089 Other allergic rhinitis: Secondary | ICD-10-CM | POA: Diagnosis not present

## 2017-06-20 ENCOUNTER — Telehealth: Payer: Self-pay | Admitting: Adult Health

## 2017-06-20 NOTE — Telephone Encounter (Signed)
06/20/17 3:36 PM called patient and provider  left generic message to call Clinic. Will wait for return call and attempt to call again next week if needed. Unable to reach patient.     Calling to inform patient of negative RPR(Syphyllis) results and that she can repeat HIV testing in 6 months as first test was negative.

## 2017-06-23 ENCOUNTER — Telehealth: Payer: Self-pay

## 2017-06-23 NOTE — Telephone Encounter (Signed)
Called Sari and was able to reach her personally.  Advised that the RPR results for Syphilis were negative.  She was appreciative of the call.

## 2017-06-24 DIAGNOSIS — J3089 Other allergic rhinitis: Secondary | ICD-10-CM | POA: Diagnosis not present

## 2017-06-24 DIAGNOSIS — J301 Allergic rhinitis due to pollen: Secondary | ICD-10-CM | POA: Diagnosis not present

## 2017-06-24 DIAGNOSIS — J3081 Allergic rhinitis due to animal (cat) (dog) hair and dander: Secondary | ICD-10-CM | POA: Diagnosis not present

## 2017-06-26 DIAGNOSIS — F411 Generalized anxiety disorder: Secondary | ICD-10-CM | POA: Diagnosis not present

## 2017-07-01 DIAGNOSIS — J3081 Allergic rhinitis due to animal (cat) (dog) hair and dander: Secondary | ICD-10-CM | POA: Diagnosis not present

## 2017-07-01 DIAGNOSIS — J3089 Other allergic rhinitis: Secondary | ICD-10-CM | POA: Diagnosis not present

## 2017-07-01 DIAGNOSIS — F411 Generalized anxiety disorder: Secondary | ICD-10-CM | POA: Diagnosis not present

## 2017-07-01 DIAGNOSIS — J301 Allergic rhinitis due to pollen: Secondary | ICD-10-CM | POA: Diagnosis not present

## 2017-07-03 DIAGNOSIS — F411 Generalized anxiety disorder: Secondary | ICD-10-CM | POA: Diagnosis not present

## 2017-07-08 DIAGNOSIS — J3089 Other allergic rhinitis: Secondary | ICD-10-CM | POA: Diagnosis not present

## 2017-07-08 DIAGNOSIS — J3081 Allergic rhinitis due to animal (cat) (dog) hair and dander: Secondary | ICD-10-CM | POA: Diagnosis not present

## 2017-07-08 DIAGNOSIS — J301 Allergic rhinitis due to pollen: Secondary | ICD-10-CM | POA: Diagnosis not present

## 2017-07-15 DIAGNOSIS — J3081 Allergic rhinitis due to animal (cat) (dog) hair and dander: Secondary | ICD-10-CM | POA: Diagnosis not present

## 2017-07-15 DIAGNOSIS — J301 Allergic rhinitis due to pollen: Secondary | ICD-10-CM | POA: Diagnosis not present

## 2017-07-15 DIAGNOSIS — J3089 Other allergic rhinitis: Secondary | ICD-10-CM | POA: Diagnosis not present

## 2017-07-23 DIAGNOSIS — F411 Generalized anxiety disorder: Secondary | ICD-10-CM | POA: Diagnosis not present

## 2017-07-29 DIAGNOSIS — J301 Allergic rhinitis due to pollen: Secondary | ICD-10-CM | POA: Diagnosis not present

## 2017-07-29 DIAGNOSIS — J3089 Other allergic rhinitis: Secondary | ICD-10-CM | POA: Diagnosis not present

## 2017-07-29 DIAGNOSIS — J3081 Allergic rhinitis due to animal (cat) (dog) hair and dander: Secondary | ICD-10-CM | POA: Diagnosis not present

## 2017-07-31 DIAGNOSIS — F411 Generalized anxiety disorder: Secondary | ICD-10-CM | POA: Diagnosis not present

## 2017-08-05 DIAGNOSIS — J3089 Other allergic rhinitis: Secondary | ICD-10-CM | POA: Diagnosis not present

## 2017-08-05 DIAGNOSIS — J301 Allergic rhinitis due to pollen: Secondary | ICD-10-CM | POA: Diagnosis not present

## 2017-08-05 DIAGNOSIS — J3081 Allergic rhinitis due to animal (cat) (dog) hair and dander: Secondary | ICD-10-CM | POA: Diagnosis not present

## 2017-08-08 DIAGNOSIS — F411 Generalized anxiety disorder: Secondary | ICD-10-CM | POA: Diagnosis not present

## 2017-08-10 DIAGNOSIS — Y33XXXA Other specified events, undetermined intent, initial encounter: Secondary | ICD-10-CM | POA: Diagnosis not present

## 2017-08-10 DIAGNOSIS — S86011A Strain of right Achilles tendon, initial encounter: Secondary | ICD-10-CM | POA: Insufficient documentation

## 2017-08-10 DIAGNOSIS — M25571 Pain in right ankle and joints of right foot: Secondary | ICD-10-CM | POA: Diagnosis not present

## 2017-08-10 DIAGNOSIS — S99911A Unspecified injury of right ankle, initial encounter: Secondary | ICD-10-CM | POA: Diagnosis not present

## 2017-08-11 DIAGNOSIS — S86011A Strain of right Achilles tendon, initial encounter: Secondary | ICD-10-CM | POA: Diagnosis not present

## 2017-08-13 DIAGNOSIS — F1099 Alcohol use, unspecified with unspecified alcohol-induced disorder: Secondary | ICD-10-CM | POA: Diagnosis not present

## 2017-08-13 DIAGNOSIS — S86011A Strain of right Achilles tendon, initial encounter: Secondary | ICD-10-CM | POA: Diagnosis not present

## 2017-08-13 DIAGNOSIS — F329 Major depressive disorder, single episode, unspecified: Secondary | ICD-10-CM | POA: Diagnosis not present

## 2017-08-14 DIAGNOSIS — S86019A Strain of unspecified Achilles tendon, initial encounter: Secondary | ICD-10-CM

## 2017-08-14 HISTORY — DX: Strain of unspecified achilles tendon, initial encounter: S86.019A

## 2017-08-18 DIAGNOSIS — F411 Generalized anxiety disorder: Secondary | ICD-10-CM | POA: Diagnosis not present

## 2017-08-18 HISTORY — PX: ACHILLES TENDON REPAIR: SUR1153

## 2017-08-21 ENCOUNTER — Telehealth: Payer: Self-pay

## 2017-08-21 NOTE — Telephone Encounter (Signed)
Daryll Drown, PA-C approved travel visit for Tuesday of next week. Please schedule.

## 2017-08-22 DIAGNOSIS — F411 Generalized anxiety disorder: Secondary | ICD-10-CM | POA: Diagnosis not present

## 2017-08-26 ENCOUNTER — Encounter: Payer: Self-pay | Admitting: Medical

## 2017-08-26 ENCOUNTER — Ambulatory Visit: Payer: BLUE CROSS/BLUE SHIELD | Admitting: Medical

## 2017-08-26 VITALS — BP 118/60 | HR 67 | Temp 98.3°F | Resp 16 | Wt 124.8 lb

## 2017-08-26 DIAGNOSIS — Z7184 Encounter for health counseling related to travel: Secondary | ICD-10-CM

## 2017-08-26 MED ORDER — AZITHROMYCIN 500 MG PO TABS: ORAL_TABLET | ORAL | 0 refills | Status: DC

## 2017-08-26 NOTE — Progress Notes (Signed)
   Subjective:    Patient ID: Veronica Gibson, female    DOB: 04-Jul-1976, 41 y.o.   MRN: [id number]  HPI 41 yo female in non acute distress comes today for travel advice.  Traveling to  [place], [place] ( city) , [place], [place] , [place], [place]. Traveling  Jan 3rd through 22nd 2019.  Had Oral Typhoid in 2016.   Review of Systems  Constitutional: Negative for chills and fever.  Musculoskeletal: Positive for gait problem (not weight bearing on right foot due to surgery of achilles tendon).     Had right achilles tendon surgery the day before Thanksgiving by Dr. Clair Gulling at [facility].    Objective:   Physical Exam  Constitutional: She is oriented to person, place, and time. She appears well-developed and well-nourished.  HENT:  Head: Normocephalic and atraumatic.  Eyes: Conjunctivae and EOM are normal. Pupils are equal, round, and reactive to light.  Neurological: She is alert and oriented to person, place, and time.  Skin: Skin is warm and dry.  Psychiatric: She has a normal mood and affect. Her behavior is normal. Judgment and thought content normal.  Nursing note and vitals reviewed.     Right foot in cast, patient on a scooter, walking boot next week per patient.    Assessment & Plan:  Travel Advice ICM in [place] showed  Typhoid oral in  2016 Tdap 2016 Patient states she received the Influenza vaccine at  [facility] in [place] in October 2018. Patient has also received Hep A and Hep B series.,Added to immunizations in Epic. Low risk for  Malaria  Recommended Cholera consult.Given travel clinics list and packing list. Meds ordered this encounter  Medications  . azithromycin (ZITHROMAX) 500 MG tablet    Sig: Take one tablet daily for travelers diarrhea, take with food    Dispense:  3 tablet    Refill:  0  Patient verbalizes understanding and has no questions at discharge.

## 2017-08-28 DIAGNOSIS — F411 Generalized anxiety disorder: Secondary | ICD-10-CM | POA: Diagnosis not present

## 2017-09-02 DIAGNOSIS — Z4789 Encounter for other orthopedic aftercare: Secondary | ICD-10-CM | POA: Diagnosis not present

## 2017-09-02 DIAGNOSIS — S86011D Strain of right Achilles tendon, subsequent encounter: Secondary | ICD-10-CM | POA: Diagnosis not present

## 2017-09-03 DIAGNOSIS — F411 Generalized anxiety disorder: Secondary | ICD-10-CM | POA: Diagnosis not present

## 2017-09-24 DIAGNOSIS — M6281 Muscle weakness (generalized): Secondary | ICD-10-CM | POA: Diagnosis not present

## 2017-09-24 DIAGNOSIS — R262 Difficulty in walking, not elsewhere classified: Secondary | ICD-10-CM | POA: Diagnosis not present

## 2017-09-24 DIAGNOSIS — M25571 Pain in right ankle and joints of right foot: Secondary | ICD-10-CM | POA: Diagnosis not present

## 2017-09-24 DIAGNOSIS — M25671 Stiffness of right ankle, not elsewhere classified: Secondary | ICD-10-CM | POA: Diagnosis not present

## 2017-09-24 DIAGNOSIS — F411 Generalized anxiety disorder: Secondary | ICD-10-CM | POA: Diagnosis not present

## 2017-10-15 DIAGNOSIS — M25571 Pain in right ankle and joints of right foot: Secondary | ICD-10-CM | POA: Diagnosis not present

## 2017-10-15 DIAGNOSIS — M6281 Muscle weakness (generalized): Secondary | ICD-10-CM | POA: Diagnosis not present

## 2017-10-15 DIAGNOSIS — F411 Generalized anxiety disorder: Secondary | ICD-10-CM | POA: Diagnosis not present

## 2017-10-15 DIAGNOSIS — R262 Difficulty in walking, not elsewhere classified: Secondary | ICD-10-CM | POA: Diagnosis not present

## 2017-10-15 DIAGNOSIS — M25671 Stiffness of right ankle, not elsewhere classified: Secondary | ICD-10-CM | POA: Diagnosis not present

## 2017-10-17 DIAGNOSIS — M25571 Pain in right ankle and joints of right foot: Secondary | ICD-10-CM | POA: Diagnosis not present

## 2017-10-17 DIAGNOSIS — R262 Difficulty in walking, not elsewhere classified: Secondary | ICD-10-CM | POA: Diagnosis not present

## 2017-10-17 DIAGNOSIS — M25671 Stiffness of right ankle, not elsewhere classified: Secondary | ICD-10-CM | POA: Diagnosis not present

## 2017-10-17 DIAGNOSIS — M6281 Muscle weakness (generalized): Secondary | ICD-10-CM | POA: Diagnosis not present

## 2017-10-20 DIAGNOSIS — R262 Difficulty in walking, not elsewhere classified: Secondary | ICD-10-CM | POA: Diagnosis not present

## 2017-10-20 DIAGNOSIS — M25571 Pain in right ankle and joints of right foot: Secondary | ICD-10-CM | POA: Diagnosis not present

## 2017-10-20 DIAGNOSIS — M25671 Stiffness of right ankle, not elsewhere classified: Secondary | ICD-10-CM | POA: Diagnosis not present

## 2017-10-20 DIAGNOSIS — F411 Generalized anxiety disorder: Secondary | ICD-10-CM | POA: Diagnosis not present

## 2017-10-20 DIAGNOSIS — M6281 Muscle weakness (generalized): Secondary | ICD-10-CM | POA: Diagnosis not present

## 2017-10-28 ENCOUNTER — Ambulatory Visit: Payer: BLUE CROSS/BLUE SHIELD | Admitting: Obstetrics and Gynecology

## 2017-10-28 DIAGNOSIS — M25571 Pain in right ankle and joints of right foot: Secondary | ICD-10-CM | POA: Diagnosis not present

## 2017-10-28 DIAGNOSIS — M25671 Stiffness of right ankle, not elsewhere classified: Secondary | ICD-10-CM | POA: Diagnosis not present

## 2017-10-28 DIAGNOSIS — R262 Difficulty in walking, not elsewhere classified: Secondary | ICD-10-CM | POA: Diagnosis not present

## 2017-10-30 ENCOUNTER — Ambulatory Visit: Payer: BLUE CROSS/BLUE SHIELD | Admitting: Obstetrics and Gynecology

## 2017-10-30 ENCOUNTER — Encounter: Payer: Self-pay | Admitting: Obstetrics and Gynecology

## 2017-10-30 VITALS — BP 100/70 | Ht 66.0 in | Wt 121.0 lb

## 2017-10-30 DIAGNOSIS — M25671 Stiffness of right ankle, not elsewhere classified: Secondary | ICD-10-CM | POA: Diagnosis not present

## 2017-10-30 DIAGNOSIS — R262 Difficulty in walking, not elsewhere classified: Secondary | ICD-10-CM | POA: Diagnosis not present

## 2017-10-30 DIAGNOSIS — M6281 Muscle weakness (generalized): Secondary | ICD-10-CM | POA: Diagnosis not present

## 2017-10-30 DIAGNOSIS — Z30014 Encounter for initial prescription of intrauterine contraceptive device: Secondary | ICD-10-CM | POA: Diagnosis not present

## 2017-10-30 DIAGNOSIS — M25571 Pain in right ankle and joints of right foot: Secondary | ICD-10-CM | POA: Diagnosis not present

## 2017-10-30 NOTE — Patient Instructions (Signed)
I value your feedback and entrusting us with your care. If you get a [facility] patient survey, I would appreciate you taking the time to let us know about your experience today. Thank you! 

## 2017-10-30 NOTE — Progress Notes (Signed)
Chief Complaint  Patient presents with  . Contraception    HPI:      [name] is a 42 y.o. [phone] who LMP was Patient's last menstrual period was 10/23/2017 (exact date)., presents today for [facility] conf. She and her husband are divorcing and she would like BC. He had had a vasectomy. She is interested in an IUD, although not currently sex active. Menses are monthly. Did OCPs in the past without any issues. Not sure she wants to take pill daily.  Last annual 8/18. Seeing therapist and psych for recent divorce. Doing better.   Past Medical History:  Diagnosis Date  . Anxiety   . Head ache   . History of mammogram 12/13/2013   CAT 2  . History of Papanicolaou smear of cervix 04/07/2014   -/-  . Seasonal allergies     Past Surgical History:  Procedure Laterality Date  . ACHILLES TENDON REPAIR  07/2017  . WISDOM TOOTH EXTRACTION      Family History  Problem Relation Age of Onset  . Cancer Paternal Grandmother 44       UTERINE  . Coronary artery disease Paternal Grandfather        CABG  . Cancer Paternal Aunt        uterine  . Cancer Other        uterine    Social History   Socioeconomic History  . Marital status: Married    Spouse name: Not on file  . Number of children: 2  . Years of education: 41  . Highest education level: Not on file  Social Needs  . Financial resource strain: Not on file  . Food insecurity - worry: Not on file  . Food insecurity - inability: Not on file  . Transportation needs - medical: Not on file  . Transportation needs - non-medical: Not on file  Occupational History  . Occupation: Designer, jewellery: [employer]  Tobacco Use  . Smoking status: Never Smoker  . Smokeless tobacco: Never Used  Substance and Sexual Activity  . Alcohol use: Yes    Comment: OCC  . Drug use: No  . Sexual activity: Not Currently    Birth control/protection: None, Abstinence  Other Topics Concern  . Not on file  Social History  Narrative  . Not on file     Current Outpatient Medications:  .  ALPRAZolam (XANAX) 0.25 MG tablet, Take by mouth., Disp: , Rfl:  .  cetirizine (ZYRTEC) 10 MG tablet, Take 10 mg by mouth daily., Disp: , Rfl:  .  escitalopram (LEXAPRO) 10 MG tablet, Take 10 mg by mouth daily., Disp: , Rfl:  .  fluticasone (FLONASE) 50 MCG/ACT nasal spray, PLACE 1-2 SPRAY IN EACH NOSTRIL ONCE A DAY, Disp: , Rfl: 3 .  loratadine (CLARITIN) 10 MG tablet, Take 10 mg by mouth 2 (two) times daily. , Disp: , Rfl:  .  mometasone (NASONEX) 50 MCG/ACT nasal spray, Place 2 sprays into the nose daily., Disp: , Rfl:    ROS:  Review of Systems  Constitutional: Negative for fever.  Gastrointestinal: Negative for blood in stool, constipation, diarrhea, nausea and vomiting.  Genitourinary: Negative for dyspareunia, dysuria, flank pain, frequency, hematuria, urgency, vaginal bleeding, vaginal discharge and vaginal pain.  Musculoskeletal: Negative for back pain.  Skin: Negative for rash.     OBJECTIVE:   Vitals:  BP 100/70   Ht 5\' 6"  (1.676 m)   Wt 121 lb (54.9 kg)  LMP 10/23/2017 (Exact Date)   BMI 19.53 kg/m   Physical Exam  Constitutional: She is oriented to person, place, and time and well-developed, well-nourished, and in no distress.  Neurological: She is alert and oriented to person, place, and time.  Psychiatric: Memory, affect and judgment normal.  Vitals reviewed.   Assessment/Plan: Encounter for initial prescription of intrauterine contraceptive device (IUD) - BC options, pro/cons discussed. Pt wants IUD. Mirena handout given, procedure explained. RTO with menses for insertion. NSAIDs.   Return if symptoms worsen or fail to improve.  Destiney Sanabia B. Maryna Yeagle, PA-C 10/30/2017 4:38 PM

## 2017-11-04 DIAGNOSIS — F411 Generalized anxiety disorder: Secondary | ICD-10-CM | POA: Diagnosis not present

## 2017-11-04 DIAGNOSIS — R262 Difficulty in walking, not elsewhere classified: Secondary | ICD-10-CM | POA: Diagnosis not present

## 2017-11-04 DIAGNOSIS — M25671 Stiffness of right ankle, not elsewhere classified: Secondary | ICD-10-CM | POA: Diagnosis not present

## 2017-11-04 DIAGNOSIS — M6281 Muscle weakness (generalized): Secondary | ICD-10-CM | POA: Diagnosis not present

## 2017-11-04 DIAGNOSIS — M25571 Pain in right ankle and joints of right foot: Secondary | ICD-10-CM | POA: Diagnosis not present

## 2017-11-06 DIAGNOSIS — J3089 Other allergic rhinitis: Secondary | ICD-10-CM | POA: Diagnosis not present

## 2017-11-06 DIAGNOSIS — J301 Allergic rhinitis due to pollen: Secondary | ICD-10-CM | POA: Diagnosis not present

## 2017-11-06 DIAGNOSIS — J3081 Allergic rhinitis due to animal (cat) (dog) hair and dander: Secondary | ICD-10-CM | POA: Diagnosis not present

## 2017-11-13 DIAGNOSIS — M25671 Stiffness of right ankle, not elsewhere classified: Secondary | ICD-10-CM | POA: Diagnosis not present

## 2017-11-13 DIAGNOSIS — M25571 Pain in right ankle and joints of right foot: Secondary | ICD-10-CM | POA: Diagnosis not present

## 2017-11-13 DIAGNOSIS — R262 Difficulty in walking, not elsewhere classified: Secondary | ICD-10-CM | POA: Diagnosis not present

## 2017-11-13 DIAGNOSIS — J3089 Other allergic rhinitis: Secondary | ICD-10-CM | POA: Diagnosis not present

## 2017-11-13 DIAGNOSIS — J301 Allergic rhinitis due to pollen: Secondary | ICD-10-CM | POA: Diagnosis not present

## 2017-11-13 DIAGNOSIS — J3081 Allergic rhinitis due to animal (cat) (dog) hair and dander: Secondary | ICD-10-CM | POA: Diagnosis not present

## 2017-11-13 DIAGNOSIS — M6281 Muscle weakness (generalized): Secondary | ICD-10-CM | POA: Diagnosis not present

## 2017-11-16 DIAGNOSIS — J111 Influenza due to unidentified influenza virus with other respiratory manifestations: Secondary | ICD-10-CM | POA: Diagnosis not present

## 2017-11-18 ENCOUNTER — Telehealth: Payer: Self-pay | Admitting: Obstetrics and Gynecology

## 2017-11-18 DIAGNOSIS — M6281 Muscle weakness (generalized): Secondary | ICD-10-CM | POA: Diagnosis not present

## 2017-11-18 DIAGNOSIS — M25671 Stiffness of right ankle, not elsewhere classified: Secondary | ICD-10-CM | POA: Diagnosis not present

## 2017-11-18 DIAGNOSIS — M25571 Pain in right ankle and joints of right foot: Secondary | ICD-10-CM | POA: Diagnosis not present

## 2017-11-18 DIAGNOSIS — R262 Difficulty in walking, not elsewhere classified: Secondary | ICD-10-CM | POA: Diagnosis not present

## 2017-11-18 NOTE — Telephone Encounter (Signed)
Patient coming in on 11/25/17 with [name] to have Mirena insertion done.

## 2017-11-18 NOTE — Telephone Encounter (Signed)
Noted. Will order to arrive by apt date/time. 

## 2017-11-20 DIAGNOSIS — J3081 Allergic rhinitis due to animal (cat) (dog) hair and dander: Secondary | ICD-10-CM | POA: Diagnosis not present

## 2017-11-20 DIAGNOSIS — J301 Allergic rhinitis due to pollen: Secondary | ICD-10-CM | POA: Diagnosis not present

## 2017-11-20 DIAGNOSIS — J3089 Other allergic rhinitis: Secondary | ICD-10-CM | POA: Diagnosis not present

## 2017-11-25 ENCOUNTER — Ambulatory Visit: Payer: BLUE CROSS/BLUE SHIELD | Admitting: Obstetrics and Gynecology

## 2017-11-25 ENCOUNTER — Encounter: Payer: Self-pay | Admitting: Obstetrics and Gynecology

## 2017-11-25 VITALS — BP 120/60 | HR 73 | Ht 66.0 in | Wt 126.0 lb

## 2017-11-25 DIAGNOSIS — J3081 Allergic rhinitis due to animal (cat) (dog) hair and dander: Secondary | ICD-10-CM | POA: Diagnosis not present

## 2017-11-25 DIAGNOSIS — Z30430 Encounter for insertion of intrauterine contraceptive device: Secondary | ICD-10-CM | POA: Diagnosis not present

## 2017-11-25 DIAGNOSIS — F411 Generalized anxiety disorder: Secondary | ICD-10-CM | POA: Diagnosis not present

## 2017-11-25 DIAGNOSIS — J3089 Other allergic rhinitis: Secondary | ICD-10-CM | POA: Diagnosis not present

## 2017-11-25 DIAGNOSIS — J301 Allergic rhinitis due to pollen: Secondary | ICD-10-CM | POA: Diagnosis not present

## 2017-11-25 MED ORDER — LEVONORGESTREL 20 MCG/24HR IU IUD: 1.0000 | INTRAUTERINE_SYSTEM | Freq: Once | INTRAUTERINE | 0 refills | Status: AC

## 2017-11-25 NOTE — Progress Notes (Signed)
   Chief Complaint  Patient presents with  . Contraception    Mirena IUD insertion      IUD PROCEDURE NOTE:  Veronica Gibson is a 42 y.o. [phone] here for Mirena  IUD insertion for future contraception.  Last annual 8/18.  BP 120/60   Pulse 73   Ht 5\' 6"  (1.676 m)   Wt 126 lb (57.2 kg)   LMP 11/18/2017 (Exact Date)   BMI 20.34 kg/m   IUD Insertion Procedure Note Patient identified, informed consent performed, consent signed.   Discussed risks of irregular bleeding, cramping, infection, malpositioning or misplacement of the IUD outside the uterus which may require further procedure such as laparoscopy, risk of failure <1%. Time out was performed.    Speculum placed in the vagina.  Cervix visualized.  Cleaned with Betadine x 2.  Grasped anteriorly with a single tooth tenaculum.  Uterus sounded to 8.0 cm.   IUD placed per manufacturer's recommendations.  Strings trimmed to 3 cm. Tenaculum was removed, good hemostasis noted.  Patient tolerated procedure well.   ASSESSMENT:  Encounter for IUD insertion - Plan: levonorgestrel (MIRENA, 52 MG,) 20 MCG/24HR IUD   Meds ordered this encounter  Medications  . levonorgestrel (MIRENA, 52 MG,) 20 MCG/24HR IUD    Sig: 1 Intra Uterine Device (1 each total) by Intrauterine route once for 1 dose.    Dispense:  1 Intra Uterine Device    Refill:  0    Order Specific Question:   Supervising Provider    Answer:   Gae Dry [[id number]]     Plan:  Patient was given post-procedure instructions.  She was advised to have backup contraception for one week.   Call if you are having increasing pain, cramps or bleeding or if you have a fever greater than 100.4 degrees F., shaking chills, nausea or vomiting. Patient was also asked to check IUD strings periodically and follow up in 4 weeks for IUD check.  Return in about 4 weeks (around 12/23/2017) for IUD f/u.  Quint Chestnut B. Janita Camberos, PA-C 11/25/2017 6:32 PM

## 2017-11-25 NOTE — Patient Instructions (Addendum)
I value your feedback and entrusting us with your care. If you get a [facility] patient survey, I would appreciate you taking the time to let us know about your experience today. Thank you!  [facility] [phone]  Instructions after IUD insertion  Most women experience no significant problems after insertion of an IUD, however minor cramping and spotting for a few days is common. Cramps may be treated with ibuprofen 800mg every 8 hours or Tylenol 650 mg every 4 hours. Contact [facility] immediately if you experience any of the following symptoms during the next week: temperature >99.6 degrees, worsening pelvic pain, abdominal pain, fainting, unusually heavy vaginal bleeding, foul vaginal discharge, or if you think you have expelled the IUD.  Nothing inserted in the vagina for 48 hours. You will be scheduled for a follow up visit in approximately four weeks.  You should check monthly to be sure you can feel the IUD strings in the upper vagina. If you are having a monthly period, try to check after each period. If you cannot feel the IUD strings,  contact [facility] immediately so we can do an exam to determine if the IUD has been expelled.   Please use backup protection until we can confirm the IUD is in place.  Call [facility] if you are exposed to or diagnosed with a sexually transmitted infection, as we will need to discuss whether it is safe for you to continue using an IUD.   

## 2017-11-27 DIAGNOSIS — M25671 Stiffness of right ankle, not elsewhere classified: Secondary | ICD-10-CM | POA: Diagnosis not present

## 2017-11-27 DIAGNOSIS — R262 Difficulty in walking, not elsewhere classified: Secondary | ICD-10-CM | POA: Diagnosis not present

## 2017-11-27 DIAGNOSIS — M25571 Pain in right ankle and joints of right foot: Secondary | ICD-10-CM | POA: Diagnosis not present

## 2017-11-27 DIAGNOSIS — M6281 Muscle weakness (generalized): Secondary | ICD-10-CM | POA: Diagnosis not present

## 2017-12-02 DIAGNOSIS — J3089 Other allergic rhinitis: Secondary | ICD-10-CM | POA: Diagnosis not present

## 2017-12-02 DIAGNOSIS — M25671 Stiffness of right ankle, not elsewhere classified: Secondary | ICD-10-CM | POA: Diagnosis not present

## 2017-12-02 DIAGNOSIS — R262 Difficulty in walking, not elsewhere classified: Secondary | ICD-10-CM | POA: Diagnosis not present

## 2017-12-02 DIAGNOSIS — M25571 Pain in right ankle and joints of right foot: Secondary | ICD-10-CM | POA: Diagnosis not present

## 2017-12-02 DIAGNOSIS — M6281 Muscle weakness (generalized): Secondary | ICD-10-CM | POA: Diagnosis not present

## 2017-12-02 DIAGNOSIS — J3081 Allergic rhinitis due to animal (cat) (dog) hair and dander: Secondary | ICD-10-CM | POA: Diagnosis not present

## 2017-12-02 DIAGNOSIS — J301 Allergic rhinitis due to pollen: Secondary | ICD-10-CM | POA: Diagnosis not present

## 2017-12-09 DIAGNOSIS — M25671 Stiffness of right ankle, not elsewhere classified: Secondary | ICD-10-CM | POA: Diagnosis not present

## 2017-12-09 DIAGNOSIS — M25571 Pain in right ankle and joints of right foot: Secondary | ICD-10-CM | POA: Diagnosis not present

## 2017-12-09 DIAGNOSIS — R262 Difficulty in walking, not elsewhere classified: Secondary | ICD-10-CM | POA: Diagnosis not present

## 2017-12-09 DIAGNOSIS — M6281 Muscle weakness (generalized): Secondary | ICD-10-CM | POA: Diagnosis not present

## 2017-12-11 ENCOUNTER — Encounter: Payer: Self-pay | Admitting: Obstetrics and Gynecology

## 2017-12-11 DIAGNOSIS — M25671 Stiffness of right ankle, not elsewhere classified: Secondary | ICD-10-CM | POA: Diagnosis not present

## 2017-12-11 DIAGNOSIS — M25571 Pain in right ankle and joints of right foot: Secondary | ICD-10-CM | POA: Diagnosis not present

## 2017-12-11 DIAGNOSIS — R262 Difficulty in walking, not elsewhere classified: Secondary | ICD-10-CM | POA: Diagnosis not present

## 2017-12-11 DIAGNOSIS — J301 Allergic rhinitis due to pollen: Secondary | ICD-10-CM | POA: Diagnosis not present

## 2017-12-11 DIAGNOSIS — J3089 Other allergic rhinitis: Secondary | ICD-10-CM | POA: Diagnosis not present

## 2017-12-11 DIAGNOSIS — J3081 Allergic rhinitis due to animal (cat) (dog) hair and dander: Secondary | ICD-10-CM | POA: Diagnosis not present

## 2017-12-11 DIAGNOSIS — M6281 Muscle weakness (generalized): Secondary | ICD-10-CM | POA: Diagnosis not present

## 2017-12-12 NOTE — Telephone Encounter (Signed)
Mirena received 11/25/17

## 2017-12-23 ENCOUNTER — Encounter: Payer: Self-pay | Admitting: Obstetrics and Gynecology

## 2017-12-23 ENCOUNTER — Ambulatory Visit: Payer: BLUE CROSS/BLUE SHIELD | Admitting: Obstetrics and Gynecology

## 2017-12-23 VITALS — BP 120/60 | Ht 65.5 in | Wt 125.0 lb

## 2017-12-23 DIAGNOSIS — J3089 Other allergic rhinitis: Secondary | ICD-10-CM | POA: Diagnosis not present

## 2017-12-23 DIAGNOSIS — M25571 Pain in right ankle and joints of right foot: Secondary | ICD-10-CM | POA: Diagnosis not present

## 2017-12-23 DIAGNOSIS — M6281 Muscle weakness (generalized): Secondary | ICD-10-CM | POA: Diagnosis not present

## 2017-12-23 DIAGNOSIS — N921 Excessive and frequent menstruation with irregular cycle: Secondary | ICD-10-CM | POA: Diagnosis not present

## 2017-12-23 DIAGNOSIS — Z975 Presence of (intrauterine) contraceptive device: Secondary | ICD-10-CM

## 2017-12-23 DIAGNOSIS — J301 Allergic rhinitis due to pollen: Secondary | ICD-10-CM | POA: Diagnosis not present

## 2017-12-23 DIAGNOSIS — Z30431 Encounter for routine checking of intrauterine contraceptive device: Secondary | ICD-10-CM

## 2017-12-23 DIAGNOSIS — R262 Difficulty in walking, not elsewhere classified: Secondary | ICD-10-CM | POA: Diagnosis not present

## 2017-12-23 DIAGNOSIS — F411 Generalized anxiety disorder: Secondary | ICD-10-CM | POA: Diagnosis not present

## 2017-12-23 DIAGNOSIS — J3081 Allergic rhinitis due to animal (cat) (dog) hair and dander: Secondary | ICD-10-CM | POA: Diagnosis not present

## 2017-12-23 DIAGNOSIS — M25671 Stiffness of right ankle, not elsewhere classified: Secondary | ICD-10-CM | POA: Diagnosis not present

## 2017-12-23 NOTE — Patient Instructions (Signed)
I value your feedback and entrusting us with your care. If you get a [facility] patient survey, I would appreciate you taking the time to let us know about your experience today. Thank you! 

## 2017-12-23 NOTE — Progress Notes (Signed)
   Chief Complaint  Patient presents with  . Contraception     History of Present Illness:  Veronica Gibson is a 42 y.o. that had a Mirena IUD placed approximately 4 weeks ago. Since that time, she denies vaginal d/c, heavy bleeding. She has had non-menstrual bleeding, daily and light since insertion. Minimal cramping, no meds needed. Hasn't been sex active yet.  Review of Systems  Constitutional: Negative for fever.  Gastrointestinal: Negative for blood in stool, constipation, diarrhea, nausea and vomiting.  Genitourinary: Positive for vaginal bleeding. Negative for dyspareunia, dysuria, flank pain, frequency, hematuria, urgency, vaginal discharge and vaginal pain.  Musculoskeletal: Negative for back pain.  Skin: Negative for rash.    Physical Exam:  BP 120/60   Ht 5' 5.5" (1.664 m)   Wt 125 lb (56.7 kg)   BMI 20.48 kg/m  Body mass index is 20.48 kg/m.  Pelvic exam:  Two IUD strings present seen coming from the cervical os. EGBUS, vaginal vault and cervix: within normal limits   Assessment:   Encounter for routine checking of intrauterine contraceptive device (IUD)  Breakthrough bleeding with IUD - Reassurance. F/u if sx persist for u/s.   IUD strings present in proper location; pt doing well  Plan: F/u if any signs of infection or can no longer feel the strings.   Ogle Hoeffner B. Britanie Harshman, PA-C 12/23/2017 3:20 PM

## 2017-12-26 DIAGNOSIS — J3089 Other allergic rhinitis: Secondary | ICD-10-CM | POA: Diagnosis not present

## 2017-12-26 DIAGNOSIS — J301 Allergic rhinitis due to pollen: Secondary | ICD-10-CM | POA: Diagnosis not present

## 2017-12-26 DIAGNOSIS — J3081 Allergic rhinitis due to animal (cat) (dog) hair and dander: Secondary | ICD-10-CM | POA: Diagnosis not present

## 2017-12-30 DIAGNOSIS — J3089 Other allergic rhinitis: Secondary | ICD-10-CM | POA: Diagnosis not present

## 2017-12-30 DIAGNOSIS — J3081 Allergic rhinitis due to animal (cat) (dog) hair and dander: Secondary | ICD-10-CM | POA: Diagnosis not present

## 2017-12-30 DIAGNOSIS — J301 Allergic rhinitis due to pollen: Secondary | ICD-10-CM | POA: Diagnosis not present

## 2018-01-01 DIAGNOSIS — M25571 Pain in right ankle and joints of right foot: Secondary | ICD-10-CM | POA: Diagnosis not present

## 2018-01-01 DIAGNOSIS — M25671 Stiffness of right ankle, not elsewhere classified: Secondary | ICD-10-CM | POA: Diagnosis not present

## 2018-01-01 DIAGNOSIS — M6281 Muscle weakness (generalized): Secondary | ICD-10-CM | POA: Diagnosis not present

## 2018-01-01 DIAGNOSIS — R262 Difficulty in walking, not elsewhere classified: Secondary | ICD-10-CM | POA: Diagnosis not present

## 2018-01-06 DIAGNOSIS — M25571 Pain in right ankle and joints of right foot: Secondary | ICD-10-CM | POA: Diagnosis not present

## 2018-01-06 DIAGNOSIS — M6281 Muscle weakness (generalized): Secondary | ICD-10-CM | POA: Diagnosis not present

## 2018-01-06 DIAGNOSIS — M25671 Stiffness of right ankle, not elsewhere classified: Secondary | ICD-10-CM | POA: Diagnosis not present

## 2018-01-06 DIAGNOSIS — R262 Difficulty in walking, not elsewhere classified: Secondary | ICD-10-CM | POA: Diagnosis not present

## 2018-01-13 ENCOUNTER — Telehealth: Payer: Self-pay

## 2018-01-13 DIAGNOSIS — R262 Difficulty in walking, not elsewhere classified: Secondary | ICD-10-CM | POA: Diagnosis not present

## 2018-01-13 DIAGNOSIS — M25671 Stiffness of right ankle, not elsewhere classified: Secondary | ICD-10-CM | POA: Diagnosis not present

## 2018-01-13 DIAGNOSIS — M25571 Pain in right ankle and joints of right foot: Secondary | ICD-10-CM | POA: Diagnosis not present

## 2018-01-13 DIAGNOSIS — F411 Generalized anxiety disorder: Secondary | ICD-10-CM | POA: Diagnosis not present

## 2018-01-13 DIAGNOSIS — M6281 Muscle weakness (generalized): Secondary | ICD-10-CM | POA: Diagnosis not present

## 2018-01-13 NOTE — Telephone Encounter (Signed)
Called pt to let her know to call her insurance comp, if it was not covered it would be $285 for every inj of the HPV vaccine. Pt stated she would call back and let us know what she wanted to do.

## 2018-01-13 NOTE — Telephone Encounter (Signed)
Pt calling wondering if she could get HPV vaccine. I know there are some recent studies that show it is helpful to get this when you're older too, I just was not sure about age. Can you please advise? I can call pt back.

## 2018-01-13 NOTE — Telephone Encounter (Signed)
She can schedule RN appt. Can do till age 42 but she should check with ins coverage first.

## 2018-01-13 NOTE — Telephone Encounter (Signed)
Patient has several more question about the HPV vaccine. Please advise

## 2018-01-14 NOTE — Telephone Encounter (Signed)
LMTRC

## 2018-01-14 NOTE — Telephone Encounter (Signed)
Questions answered.

## 2018-01-16 ENCOUNTER — Encounter: Payer: Self-pay | Admitting: Adult Health

## 2018-01-16 ENCOUNTER — Ambulatory Visit: Payer: BLUE CROSS/BLUE SHIELD | Admitting: Adult Health

## 2018-01-16 VITALS — BP 113/62 | HR 62 | Temp 98.8°F | Resp 16 | Ht 66.0 in | Wt 126.0 lb

## 2018-01-16 DIAGNOSIS — J309 Allergic rhinitis, unspecified: Secondary | ICD-10-CM

## 2018-01-16 DIAGNOSIS — H6982 Other specified disorders of Eustachian tube, left ear: Secondary | ICD-10-CM

## 2018-01-16 DIAGNOSIS — H6992 Unspecified Eustachian tube disorder, left ear: Secondary | ICD-10-CM

## 2018-01-16 DIAGNOSIS — H66002 Acute suppurative otitis media without spontaneous rupture of ear drum, left ear: Secondary | ICD-10-CM

## 2018-01-16 MED ORDER — PREDNISONE 10 MG (21) PO TBPK: ORAL_TABLET | ORAL | 0 refills | Status: DC

## 2018-01-16 MED ORDER — AMOXICILLIN-POT CLAVULANATE 875-125 MG PO TABS: 1.0000 | ORAL_TABLET | Freq: Two times a day (BID) | ORAL | 0 refills | Status: DC

## 2018-01-16 NOTE — Progress Notes (Signed)
Subjective:     Patient ID: Veronica Gibson, female   DOB: March 02, 1976, 42 y.o.   MRN: [id number]  Blood pressure 122/64, pulse 72, temperature 99.2 F (37.3 C), resp. rate 16, height 5\' 6"  (1.676 m), weight 126 lb (57.2 kg), last menstrual period 01/16/2018, SpO2 100 %. HPI  Patient is a 42 year old female in no acute distress who comes to the clinic with sinus pressure, congestion, nasal congestion for  2 weeks. She reports mild sinus pressure/ headache. Taking NSAID with relief of mild headache.   History of achilles tendon thanksgiving last year.  She is taking Claritin morning  and Zyrtec nightly - both daily per her allergist Dr. Gwynneth Munson for years per patient. She also gets allergy shots. Nasal sprays as well. She has regular follow ups.     Current Outpatient Medications:  .  cetirizine (ZYRTEC) 10 MG tablet, Take 10 mg by mouth daily., Disp: , Rfl:  .  escitalopram (LEXAPRO) 10 MG tablet, Take 10 mg by mouth daily., Disp: , Rfl:  .  fluticasone (FLONASE) 50 MCG/ACT nasal spray, PLACE 1-2 SPRAY IN EACH NOSTRIL ONCE A DAY, Disp: , Rfl: 3 .  loratadine (CLARITIN) 10 MG tablet, Take 10 mg by mouth 2 (two) times daily. , Disp: , Rfl:  .  mometasone (NASONEX) 50 MCG/ACT nasal spray, Place 2 sprays into the nose daily., Disp: , Rfl:  .  ALPRAZolam (XANAX) 0.25 MG tablet, Take by mouth., Disp: , Rfl:  .  levonorgestrel (MIRENA, 52 MG,) 20 MCG/24HR IUD, 1 Intra Uterine Device (1 each total) by Intrauterine route once for 1 dose., Disp: 1 Intra Uterine Device, Rfl: 0   Review of Systems  Constitutional: Positive for fever (x 3 days low grade 99- 100.0 oral ). Negative for activity change, appetite change, chills, diaphoresis, fatigue and unexpected weight change.  HENT: Positive for ear pain, postnasal drip, rhinorrhea, sinus pressure and sneezing. Negative for congestion, dental problem, drooling, ear discharge, facial swelling, hearing loss, mouth sores, nosebleeds, sinus pain, sore throat,  tinnitus, trouble swallowing and voice change.   Respiratory: Negative.   Cardiovascular: Negative.   Gastrointestinal: Negative.   Endocrine: Negative.   Genitourinary: Negative.   Musculoskeletal: Negative.   Allergic/Immunologic: Positive for environmental allergies. Negative for food allergies and immunocompromised state.        -- Dust Mite Mixed Allergen Ext (Mite (D. Farinae)) -- Other (See Comments)   --  And mold  -- External Vehicles   -- Other    --  Other reaction(s): Other (See Comments)            Trees, dust  -- Pollen Extract    --  Other reaction(s): Unknown  -- Shellfish Allergy    Neurological: Negative.   Hematological: Negative.   Psychiatric/Behavioral: Negative.        Objective:   Physical Exam  Constitutional: She is oriented to person, place, and time. She appears well-developed and well-nourished. She is active.  Non-toxic appearance. She does not have a sickly appearance. She does not appear ill. No distress. She is not intubated.  Patient is alert and oriented and responsive to questions Engages in eye contact with provider. Speaks in full sentences without any pauses without any shortness of breath.   Patient moves on and off of exam table and in room without difficulty. Gait is normal in hall and in room. Patient is oriented to person place time and situation. Patient answers questions appropriately and engages in conversation.  HENT:  Head: Normocephalic and atraumatic.  Right Ear: Hearing, external ear and ear canal normal. Tympanic membrane is not erythematous. A middle ear effusion is present.  Left Ear: Hearing, external ear and ear canal normal. Tympanic membrane is erythematous. A middle ear effusion is present.  Nose: Mucosal edema and rhinorrhea present.  Mouth/Throat: Uvula is midline, oropharynx is clear and moist and mucous membranes are normal. No oropharyngeal exudate. Tonsils are 1+ on the right. Tonsils are 1+ on the left.  Eyes:  Pupils are equal, round, and reactive to light. Conjunctivae, EOM and lids are normal. Right eye exhibits no discharge. Left eye exhibits no discharge. No scleral icterus.  Neck: Trachea normal, normal range of motion, full passive range of motion without pain and phonation normal. Neck supple. Normal carotid pulses, no hepatojugular reflux and no JVD present. No tracheal tenderness present. Carotid bruit is not present. No tracheal deviation present.  Cardiovascular: Normal rate, regular rhythm, normal heart sounds and intact distal pulses. Exam reveals no gallop and no friction rub.  No murmur heard. Pulmonary/Chest: Effort normal and breath sounds normal. No accessory muscle usage or stridor. No apnea, no tachypnea and no bradypnea. She is not intubated. No respiratory distress. She has no wheezes. She has no rales. She exhibits no tenderness.  Abdominal: Soft. Normal appearance and bowel sounds are normal.  Musculoskeletal: Normal range of motion.       Cervical back: Normal.  Lymphadenopathy:       Head (right side): No submental, no submandibular, no tonsillar, no preauricular, no posterior auricular and no occipital adenopathy present.       Head (left side): No submental, no submandibular, no tonsillar, no preauricular, no posterior auricular and no occipital adenopathy present.    She has no cervical adenopathy.  Neurological: She is alert and oriented to person, place, and time. She has normal strength and normal reflexes. She displays normal reflexes. No cranial nerve deficit. She exhibits normal muscle tone. She displays a negative Romberg sign. She displays no seizure activity. Coordination and gait normal.  Skin: Skin is warm, dry and intact. No rash noted. She is not diaphoretic. No erythema. No pallor.  Psychiatric: She has a normal mood and affect. Her speech is normal and behavior is normal. Judgment and thought content normal. Cognition and memory are normal.  Vitals reviewed.       Assessment:     Dysfunction of left eustachian tube  Allergic rhinitis, unspecified seasonality, unspecified trigger  Non-recurrent acute suppurative otitis media of left ear without spontaneous rupture of tympanic membrane   Plan:     Meds ordered this encounter  Medications  . amoxicillin-clavulanate (AUGMENTIN) 875-125 MG tablet    Sig: Take 1 tablet by mouth 2 (two) times daily.    Dispense:  20 tablet    Refill:  0  . predniSONE (STERAPRED UNI-PAK 21 TAB) 10 MG (21) TBPK tablet    Sig: By mouth Take 6 tablets on day 1, Take 5 tablets day 2 Take 4 tablets day 3 Take 3 tablets day 4 Take 2 tablets day five 5 Take 1 tablet day    Dispense:  21 tablet    Refill:  0   Advised patient call the office or your primary care doctor for an appointment if no improvement within 72 hours or if any symptoms change or worsen at any time  Advised ER or urgent Care if after hours or on weekend. Call 911 for emergency symptoms at any time.Patinet verbalized  understanding of all instructions given/reviewed and treatment plan and has no further questions or concerns at this time.   Patient verbalized understanding of all instructions given and denies any further questions at this time.

## 2018-01-16 NOTE — Patient Instructions (Signed)
Otitis Media, Adult Otitis media is redness, soreness, and puffiness (swelling) in the space just behind your eardrum (middle ear). It may be caused by allergies or infection. It often happens along with a cold. Follow these instructions at home:  Take your medicine as told. Finish it even if you start to feel better.  Only take over-the-counter or prescription medicines for pain, discomfort, or fever as told by your doctor.  Follow up with your doctor as told. Contact a doctor if:  You have otitis media only in one ear, or bleeding from your nose, or both.  You notice a lump on your neck.  You are not getting better in 3-5 days.  You feel worse instead of better. Get help right away if:  You have pain that is not helped with medicine.  You have puffiness, redness, or pain around your ear.  You get a stiff neck.  You cannot move part of your face (paralysis).  You notice that the bone behind your ear hurts when you touch it. This information is not intended to replace advice given to you by your health care provider. Make sure you discuss any questions you have with your health care provider. Document Released: 02/26/2008 Document Revised: 02/15/2016 Document Reviewed: 04/06/2013 Elsevier Interactive Patient Education  2017 Elsevier Inc. Sinusitis, Adult Sinusitis is soreness and inflammation of your sinuses. Sinuses are hollow spaces in the bones around your face. They are located:  Around your eyes.  In the middle of your forehead.  Behind your nose.  In your cheekbones.  Your sinuses and nasal passages are lined with a stringy fluid (mucus). Mucus normally drains out of your sinuses. When your nasal tissues get inflamed or swollen, the mucus can get trapped or blocked so air cannot flow through your sinuses. This lets bacteria, viruses, and funguses grow, and that leads to infection. Follow these instructions at home: Medicines  Take, use, or apply over-the-counter  and prescription medicines only as told by your doctor. These may include nasal sprays.  If you were prescribed an antibiotic medicine, take it as told by your doctor. Do not stop taking the antibiotic even if you start to feel better. Hydrate and Humidify  Drink enough water to keep your pee (urine) clear or pale yellow.  Use a cool mist humidifier to keep the humidity level in your home above 50%.  Breathe in steam for 10-15 minutes, 3-4 times a day or as told by your doctor. You can do this in the bathroom while a hot shower is running.  Try not to spend time in cool or dry air. Rest  Rest as much as possible.  Sleep with your head raised (elevated).  Make sure to get enough sleep each night. General instructions  Put a warm, moist washcloth on your face 3-4 times a day or as told by your doctor. This will help with discomfort.  Wash your hands often with soap and water. If there is no soap and water, use hand sanitizer.  Do not smoke. Avoid being around people who are smoking (secondhand smoke).  Keep all follow-up visits as told by your doctor. This is important. Contact a doctor if:  You have a fever.  Your symptoms get worse.  Your symptoms do not get better within 10 days. Get help right away if:  You have a very bad headache.  You cannot stop throwing up (vomiting).  You have pain or swelling around your face or eyes.  You have trouble   seeing.  You feel confused.  Your neck is stiff.  You have trouble breathing. This information is not intended to replace advice given to you by your health care provider. Make sure you discuss any questions you have with your health care provider. Document Released: 02/26/2008 Document Revised: 05/05/2016 Document Reviewed: 07/05/2015 Elsevier Interactive Patient Education  2018 Elsevier Inc.  

## 2018-01-22 DIAGNOSIS — M25671 Stiffness of right ankle, not elsewhere classified: Secondary | ICD-10-CM | POA: Diagnosis not present

## 2018-01-22 DIAGNOSIS — M6281 Muscle weakness (generalized): Secondary | ICD-10-CM | POA: Diagnosis not present

## 2018-01-22 DIAGNOSIS — J301 Allergic rhinitis due to pollen: Secondary | ICD-10-CM | POA: Diagnosis not present

## 2018-01-22 DIAGNOSIS — J3089 Other allergic rhinitis: Secondary | ICD-10-CM | POA: Diagnosis not present

## 2018-01-22 DIAGNOSIS — M25571 Pain in right ankle and joints of right foot: Secondary | ICD-10-CM | POA: Diagnosis not present

## 2018-01-22 DIAGNOSIS — R262 Difficulty in walking, not elsewhere classified: Secondary | ICD-10-CM | POA: Diagnosis not present

## 2018-01-22 DIAGNOSIS — J3081 Allergic rhinitis due to animal (cat) (dog) hair and dander: Secondary | ICD-10-CM | POA: Diagnosis not present

## 2018-01-27 DIAGNOSIS — J301 Allergic rhinitis due to pollen: Secondary | ICD-10-CM | POA: Diagnosis not present

## 2018-01-27 DIAGNOSIS — J3081 Allergic rhinitis due to animal (cat) (dog) hair and dander: Secondary | ICD-10-CM | POA: Diagnosis not present

## 2018-01-27 DIAGNOSIS — J3089 Other allergic rhinitis: Secondary | ICD-10-CM | POA: Diagnosis not present

## 2018-01-27 DIAGNOSIS — F411 Generalized anxiety disorder: Secondary | ICD-10-CM | POA: Diagnosis not present

## 2018-01-29 DIAGNOSIS — R262 Difficulty in walking, not elsewhere classified: Secondary | ICD-10-CM | POA: Diagnosis not present

## 2018-01-29 DIAGNOSIS — M25571 Pain in right ankle and joints of right foot: Secondary | ICD-10-CM | POA: Diagnosis not present

## 2018-01-29 DIAGNOSIS — M25671 Stiffness of right ankle, not elsewhere classified: Secondary | ICD-10-CM | POA: Diagnosis not present

## 2018-01-29 DIAGNOSIS — M6281 Muscle weakness (generalized): Secondary | ICD-10-CM | POA: Diagnosis not present

## 2018-02-03 DIAGNOSIS — J301 Allergic rhinitis due to pollen: Secondary | ICD-10-CM | POA: Diagnosis not present

## 2018-02-03 DIAGNOSIS — J3089 Other allergic rhinitis: Secondary | ICD-10-CM | POA: Diagnosis not present

## 2018-02-03 DIAGNOSIS — J3081 Allergic rhinitis due to animal (cat) (dog) hair and dander: Secondary | ICD-10-CM | POA: Diagnosis not present

## 2018-02-04 DIAGNOSIS — M25571 Pain in right ankle and joints of right foot: Secondary | ICD-10-CM | POA: Diagnosis not present

## 2018-02-04 DIAGNOSIS — R262 Difficulty in walking, not elsewhere classified: Secondary | ICD-10-CM | POA: Diagnosis not present

## 2018-02-04 DIAGNOSIS — M25671 Stiffness of right ankle, not elsewhere classified: Secondary | ICD-10-CM | POA: Diagnosis not present

## 2018-02-04 DIAGNOSIS — M6281 Muscle weakness (generalized): Secondary | ICD-10-CM | POA: Diagnosis not present

## 2018-02-05 DIAGNOSIS — S86011D Strain of right Achilles tendon, subsequent encounter: Secondary | ICD-10-CM | POA: Diagnosis not present

## 2018-02-05 DIAGNOSIS — J3089 Other allergic rhinitis: Secondary | ICD-10-CM | POA: Diagnosis not present

## 2018-02-09 DIAGNOSIS — F411 Generalized anxiety disorder: Secondary | ICD-10-CM | POA: Diagnosis not present

## 2018-02-10 DIAGNOSIS — J301 Allergic rhinitis due to pollen: Secondary | ICD-10-CM | POA: Diagnosis not present

## 2018-02-10 DIAGNOSIS — J3081 Allergic rhinitis due to animal (cat) (dog) hair and dander: Secondary | ICD-10-CM | POA: Diagnosis not present

## 2018-02-10 DIAGNOSIS — J3089 Other allergic rhinitis: Secondary | ICD-10-CM | POA: Diagnosis not present

## 2018-02-11 DIAGNOSIS — M25671 Stiffness of right ankle, not elsewhere classified: Secondary | ICD-10-CM | POA: Diagnosis not present

## 2018-02-11 DIAGNOSIS — R262 Difficulty in walking, not elsewhere classified: Secondary | ICD-10-CM | POA: Diagnosis not present

## 2018-02-11 DIAGNOSIS — M25571 Pain in right ankle and joints of right foot: Secondary | ICD-10-CM | POA: Diagnosis not present

## 2018-02-11 DIAGNOSIS — M6281 Muscle weakness (generalized): Secondary | ICD-10-CM | POA: Diagnosis not present

## 2018-02-12 DIAGNOSIS — F411 Generalized anxiety disorder: Secondary | ICD-10-CM | POA: Diagnosis not present

## 2018-02-17 DIAGNOSIS — J3081 Allergic rhinitis due to animal (cat) (dog) hair and dander: Secondary | ICD-10-CM | POA: Diagnosis not present

## 2018-02-17 DIAGNOSIS — J3089 Other allergic rhinitis: Secondary | ICD-10-CM | POA: Diagnosis not present

## 2018-02-17 DIAGNOSIS — J301 Allergic rhinitis due to pollen: Secondary | ICD-10-CM | POA: Diagnosis not present

## 2018-02-23 DIAGNOSIS — M25671 Stiffness of right ankle, not elsewhere classified: Secondary | ICD-10-CM | POA: Diagnosis not present

## 2018-02-23 DIAGNOSIS — M25571 Pain in right ankle and joints of right foot: Secondary | ICD-10-CM | POA: Diagnosis not present

## 2018-02-23 DIAGNOSIS — R262 Difficulty in walking, not elsewhere classified: Secondary | ICD-10-CM | POA: Diagnosis not present

## 2018-02-23 DIAGNOSIS — M6281 Muscle weakness (generalized): Secondary | ICD-10-CM | POA: Diagnosis not present

## 2018-02-26 DIAGNOSIS — J3089 Other allergic rhinitis: Secondary | ICD-10-CM | POA: Diagnosis not present

## 2018-02-26 DIAGNOSIS — J3081 Allergic rhinitis due to animal (cat) (dog) hair and dander: Secondary | ICD-10-CM | POA: Diagnosis not present

## 2018-02-26 DIAGNOSIS — J301 Allergic rhinitis due to pollen: Secondary | ICD-10-CM | POA: Diagnosis not present

## 2018-03-03 DIAGNOSIS — J3081 Allergic rhinitis due to animal (cat) (dog) hair and dander: Secondary | ICD-10-CM | POA: Diagnosis not present

## 2018-03-03 DIAGNOSIS — J301 Allergic rhinitis due to pollen: Secondary | ICD-10-CM | POA: Diagnosis not present

## 2018-03-03 DIAGNOSIS — J3089 Other allergic rhinitis: Secondary | ICD-10-CM | POA: Diagnosis not present

## 2018-03-04 DIAGNOSIS — M25671 Stiffness of right ankle, not elsewhere classified: Secondary | ICD-10-CM | POA: Diagnosis not present

## 2018-03-04 DIAGNOSIS — M6281 Muscle weakness (generalized): Secondary | ICD-10-CM | POA: Diagnosis not present

## 2018-03-04 DIAGNOSIS — R262 Difficulty in walking, not elsewhere classified: Secondary | ICD-10-CM | POA: Diagnosis not present

## 2018-03-04 DIAGNOSIS — M25571 Pain in right ankle and joints of right foot: Secondary | ICD-10-CM | POA: Diagnosis not present

## 2018-03-10 DIAGNOSIS — J3081 Allergic rhinitis due to animal (cat) (dog) hair and dander: Secondary | ICD-10-CM | POA: Diagnosis not present

## 2018-03-10 DIAGNOSIS — J3089 Other allergic rhinitis: Secondary | ICD-10-CM | POA: Diagnosis not present

## 2018-03-10 DIAGNOSIS — F411 Generalized anxiety disorder: Secondary | ICD-10-CM | POA: Diagnosis not present

## 2018-03-10 DIAGNOSIS — J301 Allergic rhinitis due to pollen: Secondary | ICD-10-CM | POA: Diagnosis not present

## 2018-03-12 DIAGNOSIS — M6281 Muscle weakness (generalized): Secondary | ICD-10-CM | POA: Diagnosis not present

## 2018-03-12 DIAGNOSIS — R262 Difficulty in walking, not elsewhere classified: Secondary | ICD-10-CM | POA: Diagnosis not present

## 2018-03-12 DIAGNOSIS — M25671 Stiffness of right ankle, not elsewhere classified: Secondary | ICD-10-CM | POA: Diagnosis not present

## 2018-03-12 DIAGNOSIS — M25571 Pain in right ankle and joints of right foot: Secondary | ICD-10-CM | POA: Diagnosis not present

## 2018-03-17 DIAGNOSIS — J3089 Other allergic rhinitis: Secondary | ICD-10-CM | POA: Diagnosis not present

## 2018-03-17 DIAGNOSIS — J3081 Allergic rhinitis due to animal (cat) (dog) hair and dander: Secondary | ICD-10-CM | POA: Diagnosis not present

## 2018-03-17 DIAGNOSIS — J301 Allergic rhinitis due to pollen: Secondary | ICD-10-CM | POA: Diagnosis not present

## 2018-03-19 DIAGNOSIS — M6281 Muscle weakness (generalized): Secondary | ICD-10-CM | POA: Diagnosis not present

## 2018-03-19 DIAGNOSIS — R262 Difficulty in walking, not elsewhere classified: Secondary | ICD-10-CM | POA: Diagnosis not present

## 2018-03-19 DIAGNOSIS — F411 Generalized anxiety disorder: Secondary | ICD-10-CM | POA: Diagnosis not present

## 2018-03-19 DIAGNOSIS — M25671 Stiffness of right ankle, not elsewhere classified: Secondary | ICD-10-CM | POA: Diagnosis not present

## 2018-03-19 DIAGNOSIS — M25571 Pain in right ankle and joints of right foot: Secondary | ICD-10-CM | POA: Diagnosis not present

## 2018-03-31 DIAGNOSIS — J3089 Other allergic rhinitis: Secondary | ICD-10-CM | POA: Diagnosis not present

## 2018-03-31 DIAGNOSIS — J301 Allergic rhinitis due to pollen: Secondary | ICD-10-CM | POA: Diagnosis not present

## 2018-03-31 DIAGNOSIS — J3081 Allergic rhinitis due to animal (cat) (dog) hair and dander: Secondary | ICD-10-CM | POA: Diagnosis not present

## 2018-04-02 DIAGNOSIS — J3089 Other allergic rhinitis: Secondary | ICD-10-CM | POA: Diagnosis not present

## 2018-04-02 DIAGNOSIS — J301 Allergic rhinitis due to pollen: Secondary | ICD-10-CM | POA: Diagnosis not present

## 2018-04-02 DIAGNOSIS — J3081 Allergic rhinitis due to animal (cat) (dog) hair and dander: Secondary | ICD-10-CM | POA: Diagnosis not present

## 2018-04-07 DIAGNOSIS — J301 Allergic rhinitis due to pollen: Secondary | ICD-10-CM | POA: Diagnosis not present

## 2018-04-07 DIAGNOSIS — J3089 Other allergic rhinitis: Secondary | ICD-10-CM | POA: Diagnosis not present

## 2018-04-07 DIAGNOSIS — J3081 Allergic rhinitis due to animal (cat) (dog) hair and dander: Secondary | ICD-10-CM | POA: Diagnosis not present

## 2018-04-09 DIAGNOSIS — M25671 Stiffness of right ankle, not elsewhere classified: Secondary | ICD-10-CM | POA: Diagnosis not present

## 2018-04-09 DIAGNOSIS — M25571 Pain in right ankle and joints of right foot: Secondary | ICD-10-CM | POA: Diagnosis not present

## 2018-04-09 DIAGNOSIS — J301 Allergic rhinitis due to pollen: Secondary | ICD-10-CM | POA: Diagnosis not present

## 2018-04-09 DIAGNOSIS — J3081 Allergic rhinitis due to animal (cat) (dog) hair and dander: Secondary | ICD-10-CM | POA: Diagnosis not present

## 2018-04-09 DIAGNOSIS — M6281 Muscle weakness (generalized): Secondary | ICD-10-CM | POA: Diagnosis not present

## 2018-04-09 DIAGNOSIS — F411 Generalized anxiety disorder: Secondary | ICD-10-CM | POA: Diagnosis not present

## 2018-04-09 DIAGNOSIS — J3089 Other allergic rhinitis: Secondary | ICD-10-CM | POA: Diagnosis not present

## 2018-04-09 DIAGNOSIS — R262 Difficulty in walking, not elsewhere classified: Secondary | ICD-10-CM | POA: Diagnosis not present

## 2018-04-13 DIAGNOSIS — J301 Allergic rhinitis due to pollen: Secondary | ICD-10-CM | POA: Diagnosis not present

## 2018-04-13 DIAGNOSIS — J3081 Allergic rhinitis due to animal (cat) (dog) hair and dander: Secondary | ICD-10-CM | POA: Diagnosis not present

## 2018-04-14 DIAGNOSIS — J3081 Allergic rhinitis due to animal (cat) (dog) hair and dander: Secondary | ICD-10-CM | POA: Diagnosis not present

## 2018-04-14 DIAGNOSIS — J301 Allergic rhinitis due to pollen: Secondary | ICD-10-CM | POA: Diagnosis not present

## 2018-04-14 DIAGNOSIS — J3089 Other allergic rhinitis: Secondary | ICD-10-CM | POA: Diagnosis not present

## 2018-04-16 DIAGNOSIS — M25571 Pain in right ankle and joints of right foot: Secondary | ICD-10-CM | POA: Diagnosis not present

## 2018-04-16 DIAGNOSIS — M6281 Muscle weakness (generalized): Secondary | ICD-10-CM | POA: Diagnosis not present

## 2018-04-16 DIAGNOSIS — R262 Difficulty in walking, not elsewhere classified: Secondary | ICD-10-CM | POA: Diagnosis not present

## 2018-04-16 DIAGNOSIS — M25671 Stiffness of right ankle, not elsewhere classified: Secondary | ICD-10-CM | POA: Diagnosis not present

## 2018-04-20 DIAGNOSIS — M25671 Stiffness of right ankle, not elsewhere classified: Secondary | ICD-10-CM | POA: Diagnosis not present

## 2018-04-20 DIAGNOSIS — M6281 Muscle weakness (generalized): Secondary | ICD-10-CM | POA: Diagnosis not present

## 2018-04-20 DIAGNOSIS — R262 Difficulty in walking, not elsewhere classified: Secondary | ICD-10-CM | POA: Diagnosis not present

## 2018-04-20 DIAGNOSIS — M25571 Pain in right ankle and joints of right foot: Secondary | ICD-10-CM | POA: Diagnosis not present

## 2018-04-21 DIAGNOSIS — J3081 Allergic rhinitis due to animal (cat) (dog) hair and dander: Secondary | ICD-10-CM | POA: Diagnosis not present

## 2018-04-21 DIAGNOSIS — J301 Allergic rhinitis due to pollen: Secondary | ICD-10-CM | POA: Diagnosis not present

## 2018-04-21 DIAGNOSIS — J3089 Other allergic rhinitis: Secondary | ICD-10-CM | POA: Diagnosis not present

## 2018-04-23 DIAGNOSIS — J3081 Allergic rhinitis due to animal (cat) (dog) hair and dander: Secondary | ICD-10-CM | POA: Diagnosis not present

## 2018-04-23 DIAGNOSIS — J3089 Other allergic rhinitis: Secondary | ICD-10-CM | POA: Diagnosis not present

## 2018-04-23 DIAGNOSIS — J301 Allergic rhinitis due to pollen: Secondary | ICD-10-CM | POA: Diagnosis not present

## 2018-04-28 DIAGNOSIS — J301 Allergic rhinitis due to pollen: Secondary | ICD-10-CM | POA: Diagnosis not present

## 2018-04-28 DIAGNOSIS — J3081 Allergic rhinitis due to animal (cat) (dog) hair and dander: Secondary | ICD-10-CM | POA: Diagnosis not present

## 2018-04-28 DIAGNOSIS — J3089 Other allergic rhinitis: Secondary | ICD-10-CM | POA: Diagnosis not present

## 2018-04-30 DIAGNOSIS — J301 Allergic rhinitis due to pollen: Secondary | ICD-10-CM | POA: Diagnosis not present

## 2018-04-30 DIAGNOSIS — J3081 Allergic rhinitis due to animal (cat) (dog) hair and dander: Secondary | ICD-10-CM | POA: Diagnosis not present

## 2018-04-30 DIAGNOSIS — J3089 Other allergic rhinitis: Secondary | ICD-10-CM | POA: Diagnosis not present

## 2018-05-01 DIAGNOSIS — R300 Dysuria: Secondary | ICD-10-CM | POA: Diagnosis not present

## 2018-05-01 DIAGNOSIS — N390 Urinary tract infection, site not specified: Secondary | ICD-10-CM | POA: Diagnosis not present

## 2018-05-01 DIAGNOSIS — R319 Hematuria, unspecified: Secondary | ICD-10-CM | POA: Diagnosis not present

## 2018-05-07 DIAGNOSIS — M25671 Stiffness of right ankle, not elsewhere classified: Secondary | ICD-10-CM | POA: Diagnosis not present

## 2018-05-07 DIAGNOSIS — M25571 Pain in right ankle and joints of right foot: Secondary | ICD-10-CM | POA: Diagnosis not present

## 2018-05-07 DIAGNOSIS — J3081 Allergic rhinitis due to animal (cat) (dog) hair and dander: Secondary | ICD-10-CM | POA: Diagnosis not present

## 2018-05-07 DIAGNOSIS — J3089 Other allergic rhinitis: Secondary | ICD-10-CM | POA: Diagnosis not present

## 2018-05-07 DIAGNOSIS — J301 Allergic rhinitis due to pollen: Secondary | ICD-10-CM | POA: Diagnosis not present

## 2018-05-07 DIAGNOSIS — M6281 Muscle weakness (generalized): Secondary | ICD-10-CM | POA: Diagnosis not present

## 2018-05-07 DIAGNOSIS — F411 Generalized anxiety disorder: Secondary | ICD-10-CM | POA: Diagnosis not present

## 2018-05-07 DIAGNOSIS — R262 Difficulty in walking, not elsewhere classified: Secondary | ICD-10-CM | POA: Diagnosis not present

## 2018-05-14 DIAGNOSIS — J3089 Other allergic rhinitis: Secondary | ICD-10-CM | POA: Diagnosis not present

## 2018-05-14 DIAGNOSIS — J3081 Allergic rhinitis due to animal (cat) (dog) hair and dander: Secondary | ICD-10-CM | POA: Diagnosis not present

## 2018-05-14 DIAGNOSIS — J301 Allergic rhinitis due to pollen: Secondary | ICD-10-CM | POA: Diagnosis not present

## 2018-05-19 DIAGNOSIS — J3089 Other allergic rhinitis: Secondary | ICD-10-CM | POA: Diagnosis not present

## 2018-05-19 DIAGNOSIS — J3081 Allergic rhinitis due to animal (cat) (dog) hair and dander: Secondary | ICD-10-CM | POA: Diagnosis not present

## 2018-05-19 DIAGNOSIS — J301 Allergic rhinitis due to pollen: Secondary | ICD-10-CM | POA: Diagnosis not present

## 2018-05-26 DIAGNOSIS — J301 Allergic rhinitis due to pollen: Secondary | ICD-10-CM | POA: Diagnosis not present

## 2018-05-26 DIAGNOSIS — J3081 Allergic rhinitis due to animal (cat) (dog) hair and dander: Secondary | ICD-10-CM | POA: Diagnosis not present

## 2018-05-26 DIAGNOSIS — J3089 Other allergic rhinitis: Secondary | ICD-10-CM | POA: Diagnosis not present

## 2018-05-26 DIAGNOSIS — F411 Generalized anxiety disorder: Secondary | ICD-10-CM | POA: Diagnosis not present

## 2018-05-28 DIAGNOSIS — F411 Generalized anxiety disorder: Secondary | ICD-10-CM | POA: Diagnosis not present

## 2018-06-02 DIAGNOSIS — J3089 Other allergic rhinitis: Secondary | ICD-10-CM | POA: Diagnosis not present

## 2018-06-02 DIAGNOSIS — J301 Allergic rhinitis due to pollen: Secondary | ICD-10-CM | POA: Diagnosis not present

## 2018-06-02 DIAGNOSIS — J3081 Allergic rhinitis due to animal (cat) (dog) hair and dander: Secondary | ICD-10-CM | POA: Diagnosis not present

## 2018-06-11 ENCOUNTER — Ambulatory Visit: Payer: BLUE CROSS/BLUE SHIELD

## 2018-06-11 DIAGNOSIS — J301 Allergic rhinitis due to pollen: Secondary | ICD-10-CM | POA: Diagnosis not present

## 2018-06-11 DIAGNOSIS — J3089 Other allergic rhinitis: Secondary | ICD-10-CM | POA: Diagnosis not present

## 2018-06-11 DIAGNOSIS — J3081 Allergic rhinitis due to animal (cat) (dog) hair and dander: Secondary | ICD-10-CM | POA: Diagnosis not present

## 2018-06-18 DIAGNOSIS — F411 Generalized anxiety disorder: Secondary | ICD-10-CM | POA: Diagnosis not present

## 2018-06-18 DIAGNOSIS — J3089 Other allergic rhinitis: Secondary | ICD-10-CM | POA: Diagnosis not present

## 2018-06-18 DIAGNOSIS — J3081 Allergic rhinitis due to animal (cat) (dog) hair and dander: Secondary | ICD-10-CM | POA: Diagnosis not present

## 2018-06-18 DIAGNOSIS — J301 Allergic rhinitis due to pollen: Secondary | ICD-10-CM | POA: Diagnosis not present

## 2018-06-25 DIAGNOSIS — J301 Allergic rhinitis due to pollen: Secondary | ICD-10-CM | POA: Diagnosis not present

## 2018-06-25 DIAGNOSIS — J3081 Allergic rhinitis due to animal (cat) (dog) hair and dander: Secondary | ICD-10-CM | POA: Diagnosis not present

## 2018-06-25 DIAGNOSIS — J3089 Other allergic rhinitis: Secondary | ICD-10-CM | POA: Diagnosis not present

## 2018-06-30 DIAGNOSIS — J3089 Other allergic rhinitis: Secondary | ICD-10-CM | POA: Diagnosis not present

## 2018-06-30 DIAGNOSIS — J301 Allergic rhinitis due to pollen: Secondary | ICD-10-CM | POA: Diagnosis not present

## 2018-06-30 DIAGNOSIS — J3081 Allergic rhinitis due to animal (cat) (dog) hair and dander: Secondary | ICD-10-CM | POA: Diagnosis not present

## 2018-07-09 DIAGNOSIS — J3081 Allergic rhinitis due to animal (cat) (dog) hair and dander: Secondary | ICD-10-CM | POA: Diagnosis not present

## 2018-07-09 DIAGNOSIS — F411 Generalized anxiety disorder: Secondary | ICD-10-CM | POA: Diagnosis not present

## 2018-07-09 DIAGNOSIS — J301 Allergic rhinitis due to pollen: Secondary | ICD-10-CM | POA: Diagnosis not present

## 2018-07-09 DIAGNOSIS — J3089 Other allergic rhinitis: Secondary | ICD-10-CM | POA: Diagnosis not present

## 2018-07-14 DIAGNOSIS — J301 Allergic rhinitis due to pollen: Secondary | ICD-10-CM | POA: Diagnosis not present

## 2018-07-14 DIAGNOSIS — J3081 Allergic rhinitis due to animal (cat) (dog) hair and dander: Secondary | ICD-10-CM | POA: Diagnosis not present

## 2018-07-14 DIAGNOSIS — J3089 Other allergic rhinitis: Secondary | ICD-10-CM | POA: Diagnosis not present

## 2018-07-23 DIAGNOSIS — F411 Generalized anxiety disorder: Secondary | ICD-10-CM | POA: Diagnosis not present

## 2018-07-23 DIAGNOSIS — J3081 Allergic rhinitis due to animal (cat) (dog) hair and dander: Secondary | ICD-10-CM | POA: Diagnosis not present

## 2018-07-23 DIAGNOSIS — J301 Allergic rhinitis due to pollen: Secondary | ICD-10-CM | POA: Diagnosis not present

## 2018-07-23 DIAGNOSIS — J3089 Other allergic rhinitis: Secondary | ICD-10-CM | POA: Diagnosis not present

## 2018-07-28 DIAGNOSIS — J3081 Allergic rhinitis due to animal (cat) (dog) hair and dander: Secondary | ICD-10-CM | POA: Diagnosis not present

## 2018-07-28 DIAGNOSIS — J301 Allergic rhinitis due to pollen: Secondary | ICD-10-CM | POA: Diagnosis not present

## 2018-07-28 DIAGNOSIS — J3089 Other allergic rhinitis: Secondary | ICD-10-CM | POA: Diagnosis not present

## 2018-07-28 DIAGNOSIS — F411 Generalized anxiety disorder: Secondary | ICD-10-CM | POA: Diagnosis not present

## 2018-07-30 ENCOUNTER — Ambulatory Visit (INDEPENDENT_AMBULATORY_CARE_PROVIDER_SITE_OTHER): Payer: BLUE CROSS/BLUE SHIELD | Admitting: Obstetrics and Gynecology

## 2018-07-30 ENCOUNTER — Other Ambulatory Visit (HOSPITAL_COMMUNITY)
Admission: RE | Admit: 2018-07-30 | Discharge: 2018-07-30 | Disposition: A | Discharge status: Home / Self Care | Payer: BLUE CROSS/BLUE SHIELD | Source: Ambulatory Visit | Attending: Obstetrics and Gynecology | Admitting: Obstetrics and Gynecology

## 2018-07-30 ENCOUNTER — Encounter: Payer: Self-pay | Admitting: Obstetrics and Gynecology

## 2018-07-30 VITALS — BP 104/60 | HR 63 | Ht 66.0 in | Wt 126.0 lb

## 2018-07-30 DIAGNOSIS — Z113 Encounter for screening for infections with a predominantly sexual mode of transmission: Secondary | ICD-10-CM

## 2018-07-30 DIAGNOSIS — Z01419 Encounter for gynecological examination (general) (routine) without abnormal findings: Secondary | ICD-10-CM | POA: Diagnosis not present

## 2018-07-30 DIAGNOSIS — Z30431 Encounter for routine checking of intrauterine contraceptive device: Secondary | ICD-10-CM

## 2018-07-30 DIAGNOSIS — Z1239 Encounter for other screening for malignant neoplasm of breast: Secondary | ICD-10-CM

## 2018-07-30 NOTE — Patient Instructions (Signed)
I value your feedback and entrusting us with your care. If you get a [facility] patient survey, I would appreciate you taking the time to let us know about your experience today. Thank you! 

## 2018-07-30 NOTE — Progress Notes (Signed)
Chief Complaint  Patient presents with  . Gynecologic Exam    Pt would like to get tested for STD's, if covered by ins would like to get the HPV vaccine, pt had a heavy flow period 9/28     HPI:      [name] is a 42 y.o. [id number] who LMP was Patient's last menstrual period was 06/20/2018 (exact date)., presents today for her annual examination.  Her menses are irregular with IUD, placed 3/19. Menses 3-4 days if she has one. Sometimes has spotting with running.  Dysmenorrhea/pelvic pain none.   Sex activity: single partner until 9/19, contraception - IUD and condoms. Mirena placed 11/25/17. Wants STD testing. Last Pap: 05/06/17 Results were: no abnormalities /neg HPV DNA  Hx of STDs: none Would like Gardasil vaccine if insurance covers now.  There is no FH of breast cancer. There is no FH of ovarian cancer. The patient does not do self-breast exams.  Tobacco use: The patient denies current or previous tobacco use. Alcohol use: social drinker Exercise: moderately active  She does get adequate calcium and Vitamin D in her diet.   Past Medical History:  Diagnosis Date  . Anxiety   . Head ache   . History of mammogram 12/13/2013   CAT 2  . History of Papanicolaou smear of cervix 04/07/2014   -/-  . Rupture Achilles tendon 08/14/2017   Right  . Seasonal allergies     Past Surgical History:  Procedure Laterality Date  . ACHILLES TENDON REPAIR  08/18/2017  . WISDOM TOOTH EXTRACTION      Family History  Problem Relation Age of Onset  . Cancer Paternal Grandmother 39       UTERINE  . Coronary artery disease Paternal Grandfather        CABG  . Cancer Paternal Aunt        uterine  . Cancer Other        uterine    Social History   Socioeconomic History  . Marital status: Legally Separated    Spouse name: Not on file  . Number of children: 2  . Years of education: 68  . Highest education level: Not on file  Occupational History  . Occupation: Insurance risk surveyor: [employer]  . Financial resource strain: Not on file  . Food insecurity:    Worry: Not on file    Inability: Not on file  . Transportation needs:    Medical: Not on file    Non-medical: Not on file  Tobacco Use  . Smoking status: Never Smoker  . Smokeless tobacco: Never Used  Substance and Sexual Activity  . Alcohol use: Yes    Comment: OCC  . Drug use: No  . Sexual activity: Yes    Birth control/protection: Abstinence, IUD    Comment: Mirena  Lifestyle  . Physical activity:    Days per week: 0 days    Minutes per session: 0 min  . Stress: Only a little  Relationships  . Social connections:    Talks on phone: Not on file    Gets together: Not on file    Attends religious service: Not on file    Active member of club or organization: Not on file    Attends meetings of clubs or organizations: Not on file    Relationship status: Not on file  . Intimate partner violence:    Fear of current or ex partner: Not  on file    Emotionally abused: Not on file    Physically abused: Not on file    Forced sexual activity: Not on file  Other Topics Concern  . Not on file  Social History Narrative  . Not on file     Current Outpatient Medications:  .  cetirizine (ZYRTEC) 10 MG tablet, Take 10 mg by mouth daily., Disp: , Rfl:  .  escitalopram (LEXAPRO) 10 MG tablet, Take 10 mg by mouth daily., Disp: , Rfl:  .  fluticasone (FLONASE) 50 MCG/ACT nasal spray, PLACE 1-2 SPRAY IN EACH NOSTRIL ONCE A DAY, Disp: , Rfl: 3 .  loratadine (CLARITIN) 10 MG tablet, Take 10 mg by mouth 2 (two) times daily. , Disp: , Rfl:  .  mometasone (NASONEX) 50 MCG/ACT nasal spray, Place 2 sprays into the nose daily., Disp: , Rfl:  .  levonorgestrel (MIRENA, 52 MG,) 20 MCG/24HR IUD, 1 Intra Uterine Device (1 each total) by Intrauterine route once for 1 dose., Disp: 1 Intra Uterine Device, Rfl: 0  ROS:  Review of Systems  Constitutional: Negative for fatigue, fever and  unexpected weight change.  Respiratory: Negative for cough, shortness of breath and wheezing.   Cardiovascular: Negative for chest pain, palpitations and leg swelling.  Gastrointestinal: Negative for blood in stool, constipation, diarrhea, nausea and vomiting.  Endocrine: Negative for cold intolerance, heat intolerance and polyuria.  Genitourinary: Negative for dyspareunia, dysuria, flank pain, frequency, genital sores, hematuria, menstrual problem, pelvic pain, urgency, vaginal bleeding, vaginal discharge and vaginal pain.  Musculoskeletal: Negative for back pain, joint swelling and myalgias.  Skin: Negative for rash.  Neurological: Negative for dizziness, syncope, light-headedness, numbness and headaches.  Hematological: Negative for adenopathy.  Psychiatric/Behavioral: Positive for agitation and dysphoric mood. Negative for confusion, sleep disturbance and suicidal ideas. The patient is not nervous/anxious.      Objective: BP 104/60   Pulse 63   Ht 5\' 6"  (1.676 m)   Wt 126 lb (57.2 kg)   LMP 06/20/2018 (Exact Date)   BMI 20.34 kg/m    Physical Exam  Constitutional: She is oriented to person, place, and time. She appears well-developed and well-nourished.  Genitourinary: Vagina normal and uterus normal. There is no rash or tenderness on the right labia. There is no rash or tenderness on the left labia. No erythema or tenderness in the vagina. No vaginal discharge found. Right adnexum does not display mass and does not display tenderness. Left adnexum does not display mass and does not display tenderness.  Cervix exhibits visible IUD strings. Cervix does not exhibit motion tenderness or polyp. Uterus is not enlarged or tender.  Neck: Normal range of motion. No thyromegaly present.  Cardiovascular: Normal rate, regular rhythm and normal heart sounds.  No murmur heard. Pulmonary/Chest: Effort normal and breath sounds normal. Right breast exhibits no mass, no nipple discharge, no skin  change and no tenderness. Left breast exhibits no mass, no nipple discharge, no skin change and no tenderness.  Abdominal: Soft. There is no tenderness. There is no guarding.  Musculoskeletal: Normal range of motion.  Neurological: She is alert and oriented to person, place, and time. No cranial nerve deficit.  Psychiatric: She has a normal mood and affect. Her behavior is normal.  Vitals reviewed.   Assessment/Plan: Encounter for annual routine gynecological examination  Encounter for routine checking of intrauterine contraceptive device (IUD) - IUD strings in place.  Screening for STD (sexually transmitted disease) - Pt request. Will call wiht results. - Plan: HIV Antibody (  routine testing w rflx), RPR, Hepatitis C antibody, Cervicovaginal ancillary only  Screening for breast cancer - Pt to sched mammo - Plan: MM 3D SCREEN BREAST BILATERAL             GYN counsel mammography screening, adequate intake of calcium and vitamin D; Gardasil--pt to check with ins. If covered, RTO for inj.     F/U  Return in about 1 year (around 07/31/2019).  Alicia B. Copland, PA-C 07/30/2018 9:25 AM

## 2018-07-31 LAB — CERVICOVAGINAL ANCILLARY ONLY
Chlamydia: NEGATIVE
Neisseria Gonorrhea: NEGATIVE

## 2018-07-31 LAB — HIV ANTIBODY (ROUTINE TESTING W REFLEX): HIV Screen 4th Generation wRfx: NONREACTIVE

## 2018-07-31 LAB — HEPATITIS C ANTIBODY: Hep C Virus Ab: <0.1 s/co ratio (ref 0.0–0.9)

## 2018-07-31 LAB — RPR: RPR Ser Ql: NONREACTIVE

## 2018-08-02 NOTE — Progress Notes (Signed)
Pls let pt know all STD testing was neg. Thx

## 2018-08-03 ENCOUNTER — Telehealth: Payer: Self-pay | Admitting: Obstetrics and Gynecology

## 2018-08-03 NOTE — Telephone Encounter (Signed)
Pt aware.

## 2018-08-03 NOTE — Progress Notes (Signed)
Called pt, no answer, LVMTRC. 

## 2018-08-03 NOTE — Telephone Encounter (Signed)
Patient is calling for labs results. Please advise. 

## 2018-08-04 ENCOUNTER — Ambulatory Visit: Payer: BLUE CROSS/BLUE SHIELD

## 2018-08-04 DIAGNOSIS — J3081 Allergic rhinitis due to animal (cat) (dog) hair and dander: Secondary | ICD-10-CM | POA: Diagnosis not present

## 2018-08-04 DIAGNOSIS — J301 Allergic rhinitis due to pollen: Secondary | ICD-10-CM | POA: Diagnosis not present

## 2018-08-04 DIAGNOSIS — J3089 Other allergic rhinitis: Secondary | ICD-10-CM | POA: Diagnosis not present

## 2018-08-04 NOTE — Progress Notes (Signed)
No, that is only with pap smear. Done every 3 years. The virus is not in her blood so can't be tested. We are only concerned if it affects cervical cells.

## 2018-08-04 NOTE — Progress Notes (Signed)
Called pt, no answer, left msg since vm box is her asking to leave msg, its not automatic.

## 2018-08-13 DIAGNOSIS — J3081 Allergic rhinitis due to animal (cat) (dog) hair and dander: Secondary | ICD-10-CM | POA: Diagnosis not present

## 2018-08-13 DIAGNOSIS — J301 Allergic rhinitis due to pollen: Secondary | ICD-10-CM | POA: Diagnosis not present

## 2018-08-13 DIAGNOSIS — J3089 Other allergic rhinitis: Secondary | ICD-10-CM | POA: Diagnosis not present

## 2018-08-14 ENCOUNTER — Ambulatory Visit (INDEPENDENT_AMBULATORY_CARE_PROVIDER_SITE_OTHER): Payer: BLUE CROSS/BLUE SHIELD

## 2018-08-14 DIAGNOSIS — Z23 Encounter for immunization: Secondary | ICD-10-CM

## 2018-08-17 ENCOUNTER — Ambulatory Visit: Payer: BLUE CROSS/BLUE SHIELD

## 2018-08-24 DIAGNOSIS — J3081 Allergic rhinitis due to animal (cat) (dog) hair and dander: Secondary | ICD-10-CM | POA: Diagnosis not present

## 2018-08-24 DIAGNOSIS — J301 Allergic rhinitis due to pollen: Secondary | ICD-10-CM | POA: Diagnosis not present

## 2018-08-25 DIAGNOSIS — J3089 Other allergic rhinitis: Secondary | ICD-10-CM | POA: Diagnosis not present

## 2018-08-25 DIAGNOSIS — J3081 Allergic rhinitis due to animal (cat) (dog) hair and dander: Secondary | ICD-10-CM | POA: Diagnosis not present

## 2018-08-25 DIAGNOSIS — J301 Allergic rhinitis due to pollen: Secondary | ICD-10-CM | POA: Diagnosis not present

## 2018-08-31 ENCOUNTER — Ambulatory Visit: Payer: BLUE CROSS/BLUE SHIELD | Admitting: Medical

## 2018-08-31 VITALS — BP 123/72 | HR 71 | Temp 98.5°F | Resp 16 | Wt 124.4 lb

## 2018-08-31 DIAGNOSIS — Z7184 Encounter for health counseling related to travel: Secondary | ICD-10-CM

## 2018-08-31 DIAGNOSIS — Z23 Encounter for immunization: Secondary | ICD-10-CM

## 2018-08-31 MED ORDER — AZITHROMYCIN 500 MG PO TABS: 500.0000 mg | ORAL_TABLET | Freq: Every day | ORAL | 0 refills | Status: DC

## 2018-08-31 NOTE — Patient Instructions (Signed)
Information:TravelVaccine Information Vaccines, also called immunizations, can protect you from certain diseases. Vaccines can also prevent the spread of certain infections. It is important to see your health care provider or a travel medicine specialist 4-6 weeks before you travel. This allows time for recommended vaccines to take effect. It also provides enough time for you to get vaccines that must be given in a series over a period of days or weeks. Vaccines for travelers include:  Routine vaccines. These vaccines are standard.  Recommended travel vaccines. These vaccines are generally recommended before international travel.  Geographically required travel vaccines. These vaccines are necessary before travel to some countries or regions.  If it is less than 4 weeks before you leave, you should still see your health care provider. You might still benefit from vaccines or medicines. What are routine vaccines? Routine vaccines are shots that can protect you from common diseases in many parts of the world. Most routine vaccines are given at certain ages starting in childhood. Routine vaccines also include the annual flu (influenza) vaccine. It is important that you are up to date on your routine vaccines before you travel. You may be advised to get extra doses, also called booster vaccines, such as the Tdap (tetanus, diphtheria, and pertussis). What are recommended vaccines? Recommended travel vaccines change over time. Your health care provider can tell you what vaccines are recommended before your trip. The most common recommended vaccines before travel are hepatitis A and typhoid vaccines. Know your travel schedule when you visit your health care provider. The vaccines that are recommended before foreign travel will depend on several factors, such as:  The country or countries of travel.  Whether you will be traveling to rural areas.  How long you will be traveling.  The season of the  year.  Your age. Older adults should get a vaccine against a certain type of pneumonia (pneumococcal) and a vaccine against shingles (herpes zoster).  Your health status.  Your previous vaccines.  The annual influenza vaccine sometimes differs for the [place] and [place] hemispheres. You should:  Get both vaccines if you are traveling to the other hemisphere, and you have a chronic medical condition.  Get the vaccine shortly before or during the flu season, and only if the vaccine in your country differs from the vaccine in your destination country.  Get the other influenza vaccine either before leaving the country or shortly after arriving at the destination country.  What are geographically required vaccines? Children should be up to date with all of the recommended vaccinations. Parents should follow the standard vaccination guidelines that are recommended by the pediatrician. Some vaccines may be required during an ongoing outbreak of an infectious disease in a country or region. Your health care provider will be able to tell you about any outbreaks and required vaccines. Some examples of required vaccines include: Yellow fever vaccine  Proof of yellow fever immunization is currently required for most people before traveling to certain countries in [place] and [place].  If proof of immunization is incomplete or inaccurate, you could be quarantined, denied entry, or given another dose of vaccine at the travel site.  This vaccine can only be obtained at approved centers.  You should get the yellow fever vaccine at least 10 days before your trip.  After 10 days, most people show immunity to yellow fever.  If it has been longer than 10 years since you received the yellow fever vaccine, another dose is required.  Meningococcal vaccine  Meningococcal immunization may be required prior to travel to parts of [place] and [place].  Proof of meningococcal immunization is  required by the [place] for any person older than age 49 who is taking part in [place] or [place].  Visas for traveling to take part in hajj or umrah will not even be issued until there is proof of immunization. You should get this vaccine at least 10 days before your trip.  After 10 days, most people show immunity.  If it has been longer than 3 years since your last immunization, another dose is required.  Some travel circumstances may require additional vaccination with the following vaccines:  Hepatitis B.  Rabies.  Tick-borne encephalitis.  Malaria.  Where to find more information:  Centers for Disease Control and Prevention (CDC): http://www.wolf.info/  World Health Organization [facility]): RoleLink.com.br This information is not intended to replace advice given to you by your health care provider. Make sure you discuss any questions you have with your health care provider. Document Released: 08/28/2009 Document Revised: 06/04/2016 Document Reviewed: 02/13/2016 Elsevier Interactive Patient Education  2018 [employer].  Dengue Fever Dengue fever is an illness that causes a fever and other flu-like symptoms. It is caused by a virus that spreads to humans through a bite from an infected mosquito. Dengue fever cannot spread from person to person (is not contagious). Dengue hemorrhagic fever is a more severe form of the illness. This can cause dangerous complications if it is not diagnosed and treated right away. Very few people with dengue fever develop dengue hemorrhagic fever. What are the causes? Dengue fever and dengue hemorrhagic fever are caused by four different viruses.  These viruses are carried by mosquitoes that are primarily found in tropical areas.  The viruses can pass to people who are bitten by an infected mosquito.  What increases the risk? Almost all infections are in people who have traveled to parts of the world where mosquitoes carrying the virus are  common. You may be at risk if you have been in:  [place].  [place].  [place].  [place].  [place]. This includes [place], [place], and [place].  [place].  What are the signs or symptoms? Signs and symptoms usually start 4-10 days after being bitten by an infected mosquito. They usually last for 3-7 days. Symptoms of this condition include a sudden fever above 104F (40C) and at least two of the following:  Chills.  Severe headache.  Pain behind the eyes.  Bone pain.  Nausea and vomiting.  Swollen glands.  Muscle and joint pain.  Rash.  Bruising easily and mild bleeding from the nose or gums.  Dengue hemorrhagic fever starts with symptoms that are similar to symptoms of dengue fever. First, a fever develops that usually lasts 3-7 days. After the fever lowers below 100F (38C), new signs and symptoms develop. These may include:  Severe pain in the abdomen.  Trouble breathing.  Fatigue and restlessness.  Low blood pressure.  Fluid buildup in the abdomen and lungs.  Bruising easily and bleeding from the nose and gums.  Vomiting blood.  Blood in urine or stool (feces).  How is this diagnosed? This condition may be diagnosed based on:  A physical exam.  Your symptoms.  Your travel history.  Other tests, including: ? Blood tests. ? Tourniquet test. This involves placing a blood pressure cuff around your arm for 5 minutes, then checking whether you have dark or red spots on your skin. Those  spots can be a sign of the illness.  How is this treated? There is no specific treatment for this condition. Your health care provider may recommend taking steps to relieve symptoms, such as:  Taking certain pain relievers that contain acetaminophen.  Getting plenty of rest.  Staying hydrated.  Dengue hemorrhagic fever usually requires treatment in a hospital. Treatment may include:  Receiving fluids through an IV tube.  Receiving donated blood  (transfusions).  Follow these instructions at home:  Rest as told by your health care provider.  Take over-the-counter and prescription medicines only as told by your health care provider.  Do not take medicines such as aspirin or ibuprofen. These medicines can thin your blood. Ask your health care provider which pain relieving medicines are safe for you.  Drink enough fluids to keep your urine clear or pale yellow.  Avoid mosquito bites.  Take sponge baths to help cool your skin.  Keep all follow-up visits as told by your health care provider. This is important. How is this prevented?  Use insect repellents that contain DEET, picaridin, IR3535, oil of lemon eucalyptus, or para-menthane-diol.  Wear long-sleeved shirts and long pants to help prevent mosquito bites.  Do not leave standing water outside, because that is where mosquitoes often reproduce. If you find something that has been holding water for a long time, dump it out and wash it thoroughly.  If you open your windows, make sure they have screens. If they do not have screens, keep them closed if possible. Contact a health care provider if:  The medicine recommended by your health care provider does not control your fever or other symptoms. Get help right away if:  You develop new symptoms after 3-7 days.  Your skin becomes cold and clammy.  You have red spots or patches on your skin.  You become upset and confused.  You have very severe pain in your abdomen.  You cannot stop vomiting.  You have blood in your vomit, urine, or feces.  You have bleeding from your nose or gums that will not stop.  You have trouble breathing.  You cannot drink enough fluids and you start to urinate less often. Summary  Dengue fever is an illness that causes a fever and other flu-like symptoms. It is caused by a virus that spreads to humans through a bite from an infected mosquito. It is not contagious.  Dengue hemorrhagic  fever is a more severe form of the illness. It can cause dangerous complications if it is not diagnosed and treated right away.  Symptoms of this condition include a sudden fever above 104F (40C) combined with two or more other symptoms, including chills, severe headache, pain behind the eyes, bone pain, nausea and vomiting, swollen glands, muscle and joint pain, rash, easy bruising, and mild bleeding from the nose or gums.  Dengue hemorrhagic fever starts with symptoms that are similar to symptoms of dengue fever. Then, new signs or symptoms develop after the fever lowers below 100F (38C).  There is no specific treatment for this condition. Your health care provider may recommend certain steps to relieve symptoms, including getting rest, taking pain relievers that contain acetaminophen, and staying hydrated. This information is not intended to replace advice given to you by your health care provider. Make sure you discuss any questions you have with your health care provider. Document Released: 06/01/2002 Document Revised: 07/29/2016 Document Reviewed: 07/29/2016 Elsevier Interactive Patient Education  2017 [employer].

## 2018-08-31 NOTE — Progress Notes (Addendum)
   Subjective:    Patient ID: Veronica Gibson, female    DOB: 1976-01-03, 42 y.o.   MRN: [id number]  HPI 42 yo female in non acute here for Travel advice appointment . Traveling to [place], [place], [place] , [place], [place], [place], [place] in Jan 2020. Blood pressure 123/72, pulse 71, temperature 98.5 F (36.9 C), temperature source Tympanic, resp. rate 16, weight 124 lb 6.4 oz (56.4 kg), last menstrual period 06/20/2018, SpO2 98 %. Needs to be updated on Tdap per her MyChart recommendations. .  Does have documentation in [place] of of Hep A and Hep B series being  Completed. Though only  2 dates are listed for the Hep B series. A note is in medicat that patient has completed the series of 3 vaccinations per her immunization record.  Allergies  Allergen Reactions  . Dust Mite Mixed Allergen Ext [Mite (D. Farinae)] Other (See Comments)    And mold  . Other     Other reaction(s): Other (See Comments) Trees, dust  . Pollen Extract     Other reaction(s): Unknown  . Shellfish Allergy    Review of Systems    no complaints today  Objective:   Physical Exam  Constitutional: She is oriented to person, place, and time. She appears well-developed and well-nourished.  HENT:  Head: Normocephalic and atraumatic.  Eyes: Pupils are equal, round, and reactive to light. Conjunctivae and EOM are normal.  Cardiovascular: Normal rate, regular rhythm and normal heart sounds.  Pulmonary/Chest: Effort normal and breath sounds normal.  Neurological: She is alert and oriented to person, place, and time.  Skin: Skin is warm and dry.  Psychiatric: She has a normal mood and affect. Her behavior is normal. Judgment and thought content normal.  Nursing note and vitals reviewed.         Assessment & Plan:  Travel Advice Reviewed traveler's diarrhea with patient. Given Azithromycin, history of achilles tendon rupture ( climbing up a warped wall  barefoot, so declines Cipro.  Reviewed Dengue Fever and Rabies with patient.  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  MMR titer pending. Did get MMR   3rd dose in 9/19.and Influenza and Guardasil vaccinations.   Meds ordered this encounter  Medications  . azithromycin (ZITHROMAX) 500 MG tablet    Sig: Take 1 tablet (500 mg total) by mouth daily.    Dispense:  3 tablet    Refill:  0   Will contacat patient with MMR titer when I get results.  Patient verbalizes understanding and has no questions at discharge.

## 2018-09-01 ENCOUNTER — Encounter: Payer: Self-pay | Admitting: Medical

## 2018-09-01 DIAGNOSIS — J301 Allergic rhinitis due to pollen: Secondary | ICD-10-CM | POA: Diagnosis not present

## 2018-09-01 DIAGNOSIS — J3081 Allergic rhinitis due to animal (cat) (dog) hair and dander: Secondary | ICD-10-CM | POA: Diagnosis not present

## 2018-09-01 DIAGNOSIS — J3089 Other allergic rhinitis: Secondary | ICD-10-CM | POA: Diagnosis not present

## 2018-09-01 LAB — MEASLES/MUMPS/RUBELLA IMMUNITY
MUMPS ABS, IGG: 58.6 AU/mL (ref >10.9)
RUBEOLA AB, IGG: 28.7 AU/mL (ref >16.4)
Rubella Antibodies, IGG: 8.15 index (ref >0.99)

## 2018-09-07 DIAGNOSIS — F411 Generalized anxiety disorder: Secondary | ICD-10-CM | POA: Diagnosis not present

## 2018-09-08 DIAGNOSIS — J3089 Other allergic rhinitis: Secondary | ICD-10-CM | POA: Diagnosis not present

## 2018-09-08 DIAGNOSIS — J3081 Allergic rhinitis due to animal (cat) (dog) hair and dander: Secondary | ICD-10-CM | POA: Diagnosis not present

## 2018-09-08 DIAGNOSIS — J301 Allergic rhinitis due to pollen: Secondary | ICD-10-CM | POA: Diagnosis not present

## 2018-09-17 DIAGNOSIS — J3081 Allergic rhinitis due to animal (cat) (dog) hair and dander: Secondary | ICD-10-CM | POA: Diagnosis not present

## 2018-09-17 DIAGNOSIS — J3089 Other allergic rhinitis: Secondary | ICD-10-CM | POA: Diagnosis not present

## 2018-09-17 DIAGNOSIS — J301 Allergic rhinitis due to pollen: Secondary | ICD-10-CM | POA: Diagnosis not present

## 2018-09-22 DIAGNOSIS — J3081 Allergic rhinitis due to animal (cat) (dog) hair and dander: Secondary | ICD-10-CM | POA: Diagnosis not present

## 2018-09-22 DIAGNOSIS — J301 Allergic rhinitis due to pollen: Secondary | ICD-10-CM | POA: Diagnosis not present

## 2018-09-22 DIAGNOSIS — J3089 Other allergic rhinitis: Secondary | ICD-10-CM | POA: Diagnosis not present

## 2018-10-15 DIAGNOSIS — J3081 Allergic rhinitis due to animal (cat) (dog) hair and dander: Secondary | ICD-10-CM | POA: Diagnosis not present

## 2018-10-15 DIAGNOSIS — J3089 Other allergic rhinitis: Secondary | ICD-10-CM | POA: Diagnosis not present

## 2018-10-15 DIAGNOSIS — J301 Allergic rhinitis due to pollen: Secondary | ICD-10-CM | POA: Diagnosis not present

## 2018-10-16 ENCOUNTER — Ambulatory Visit: Payer: BLUE CROSS/BLUE SHIELD

## 2018-10-19 ENCOUNTER — Ambulatory Visit (INDEPENDENT_AMBULATORY_CARE_PROVIDER_SITE_OTHER): Payer: BLUE CROSS/BLUE SHIELD

## 2018-10-19 DIAGNOSIS — Z23 Encounter for immunization: Secondary | ICD-10-CM | POA: Diagnosis not present

## 2018-10-19 NOTE — Progress Notes (Signed)
Pt presents for Gardasil #2. Given IM Right Deltoid. Pt tolerated procedure well.

## 2018-10-20 DIAGNOSIS — J3089 Other allergic rhinitis: Secondary | ICD-10-CM | POA: Diagnosis not present

## 2018-10-20 DIAGNOSIS — J3081 Allergic rhinitis due to animal (cat) (dog) hair and dander: Secondary | ICD-10-CM | POA: Diagnosis not present

## 2018-10-20 DIAGNOSIS — J301 Allergic rhinitis due to pollen: Secondary | ICD-10-CM | POA: Diagnosis not present

## 2018-10-22 ENCOUNTER — Ambulatory Visit
Admission: RE | Admit: 2018-10-22 | Discharge: 2018-10-22 | Disposition: A | Discharge status: Home / Self Care | Payer: BLUE CROSS/BLUE SHIELD | Source: Ambulatory Visit | Attending: Obstetrics and Gynecology | Admitting: Obstetrics and Gynecology

## 2018-10-22 DIAGNOSIS — Z1239 Encounter for other screening for malignant neoplasm of breast: Secondary | ICD-10-CM | POA: Diagnosis not present

## 2018-10-22 DIAGNOSIS — J3089 Other allergic rhinitis: Secondary | ICD-10-CM | POA: Diagnosis not present

## 2018-10-22 DIAGNOSIS — Z1231 Encounter for screening mammogram for malignant neoplasm of breast: Secondary | ICD-10-CM | POA: Diagnosis not present

## 2018-10-22 DIAGNOSIS — J301 Allergic rhinitis due to pollen: Secondary | ICD-10-CM | POA: Diagnosis not present

## 2018-10-22 DIAGNOSIS — J3081 Allergic rhinitis due to animal (cat) (dog) hair and dander: Secondary | ICD-10-CM | POA: Diagnosis not present

## 2018-10-27 DIAGNOSIS — F411 Generalized anxiety disorder: Secondary | ICD-10-CM | POA: Diagnosis not present

## 2018-10-29 DIAGNOSIS — J3081 Allergic rhinitis due to animal (cat) (dog) hair and dander: Secondary | ICD-10-CM | POA: Diagnosis not present

## 2018-10-29 DIAGNOSIS — J301 Allergic rhinitis due to pollen: Secondary | ICD-10-CM | POA: Diagnosis not present

## 2018-10-29 DIAGNOSIS — J3089 Other allergic rhinitis: Secondary | ICD-10-CM | POA: Diagnosis not present

## 2018-10-30 ENCOUNTER — Encounter: Payer: Self-pay | Admitting: Obstetrics and Gynecology

## 2018-11-04 DIAGNOSIS — F411 Generalized anxiety disorder: Secondary | ICD-10-CM | POA: Diagnosis not present

## 2018-11-05 DIAGNOSIS — J3089 Other allergic rhinitis: Secondary | ICD-10-CM | POA: Diagnosis not present

## 2018-11-05 DIAGNOSIS — J3081 Allergic rhinitis due to animal (cat) (dog) hair and dander: Secondary | ICD-10-CM | POA: Diagnosis not present

## 2018-11-05 DIAGNOSIS — J301 Allergic rhinitis due to pollen: Secondary | ICD-10-CM | POA: Diagnosis not present

## 2018-11-24 DIAGNOSIS — J3081 Allergic rhinitis due to animal (cat) (dog) hair and dander: Secondary | ICD-10-CM | POA: Diagnosis not present

## 2018-11-24 DIAGNOSIS — J3089 Other allergic rhinitis: Secondary | ICD-10-CM | POA: Diagnosis not present

## 2018-11-24 DIAGNOSIS — J301 Allergic rhinitis due to pollen: Secondary | ICD-10-CM | POA: Diagnosis not present

## 2018-11-24 DIAGNOSIS — F411 Generalized anxiety disorder: Secondary | ICD-10-CM | POA: Diagnosis not present

## 2018-12-03 DIAGNOSIS — J301 Allergic rhinitis due to pollen: Secondary | ICD-10-CM | POA: Diagnosis not present

## 2018-12-03 DIAGNOSIS — J3089 Other allergic rhinitis: Secondary | ICD-10-CM | POA: Diagnosis not present

## 2018-12-03 DIAGNOSIS — J3081 Allergic rhinitis due to animal (cat) (dog) hair and dander: Secondary | ICD-10-CM | POA: Diagnosis not present

## 2018-12-08 DIAGNOSIS — F411 Generalized anxiety disorder: Secondary | ICD-10-CM | POA: Diagnosis not present

## 2018-12-08 DIAGNOSIS — J301 Allergic rhinitis due to pollen: Secondary | ICD-10-CM | POA: Diagnosis not present

## 2018-12-08 DIAGNOSIS — J3089 Other allergic rhinitis: Secondary | ICD-10-CM | POA: Diagnosis not present

## 2018-12-08 DIAGNOSIS — J3081 Allergic rhinitis due to animal (cat) (dog) hair and dander: Secondary | ICD-10-CM | POA: Diagnosis not present

## 2018-12-15 DIAGNOSIS — J301 Allergic rhinitis due to pollen: Secondary | ICD-10-CM | POA: Diagnosis not present

## 2018-12-15 DIAGNOSIS — J3081 Allergic rhinitis due to animal (cat) (dog) hair and dander: Secondary | ICD-10-CM | POA: Diagnosis not present

## 2018-12-15 DIAGNOSIS — J3089 Other allergic rhinitis: Secondary | ICD-10-CM | POA: Diagnosis not present

## 2018-12-22 DIAGNOSIS — J301 Allergic rhinitis due to pollen: Secondary | ICD-10-CM | POA: Diagnosis not present

## 2018-12-22 DIAGNOSIS — J3089 Other allergic rhinitis: Secondary | ICD-10-CM | POA: Diagnosis not present

## 2018-12-22 DIAGNOSIS — J3081 Allergic rhinitis due to animal (cat) (dog) hair and dander: Secondary | ICD-10-CM | POA: Diagnosis not present

## 2018-12-23 DIAGNOSIS — F411 Generalized anxiety disorder: Secondary | ICD-10-CM | POA: Diagnosis not present

## 2018-12-29 DIAGNOSIS — J3081 Allergic rhinitis due to animal (cat) (dog) hair and dander: Secondary | ICD-10-CM | POA: Diagnosis not present

## 2018-12-29 DIAGNOSIS — J3089 Other allergic rhinitis: Secondary | ICD-10-CM | POA: Diagnosis not present

## 2018-12-29 DIAGNOSIS — J301 Allergic rhinitis due to pollen: Secondary | ICD-10-CM | POA: Diagnosis not present

## 2018-12-31 DIAGNOSIS — F411 Generalized anxiety disorder: Secondary | ICD-10-CM | POA: Diagnosis not present

## 2018-12-31 DIAGNOSIS — F4323 Adjustment disorder with mixed anxiety and depressed mood: Secondary | ICD-10-CM | POA: Diagnosis not present

## 2019-01-07 DIAGNOSIS — J301 Allergic rhinitis due to pollen: Secondary | ICD-10-CM | POA: Diagnosis not present

## 2019-01-07 DIAGNOSIS — J3089 Other allergic rhinitis: Secondary | ICD-10-CM | POA: Diagnosis not present

## 2019-01-07 DIAGNOSIS — J3081 Allergic rhinitis due to animal (cat) (dog) hair and dander: Secondary | ICD-10-CM | POA: Diagnosis not present

## 2019-01-13 DIAGNOSIS — F411 Generalized anxiety disorder: Secondary | ICD-10-CM | POA: Diagnosis not present

## 2019-01-14 DIAGNOSIS — J3089 Other allergic rhinitis: Secondary | ICD-10-CM | POA: Diagnosis not present

## 2019-01-14 DIAGNOSIS — J301 Allergic rhinitis due to pollen: Secondary | ICD-10-CM | POA: Diagnosis not present

## 2019-01-14 DIAGNOSIS — J3081 Allergic rhinitis due to animal (cat) (dog) hair and dander: Secondary | ICD-10-CM | POA: Diagnosis not present

## 2019-01-21 DIAGNOSIS — J3081 Allergic rhinitis due to animal (cat) (dog) hair and dander: Secondary | ICD-10-CM | POA: Diagnosis not present

## 2019-01-21 DIAGNOSIS — J301 Allergic rhinitis due to pollen: Secondary | ICD-10-CM | POA: Diagnosis not present

## 2019-01-21 DIAGNOSIS — J3089 Other allergic rhinitis: Secondary | ICD-10-CM | POA: Diagnosis not present

## 2019-01-28 DIAGNOSIS — J301 Allergic rhinitis due to pollen: Secondary | ICD-10-CM | POA: Diagnosis not present

## 2019-01-28 DIAGNOSIS — J3081 Allergic rhinitis due to animal (cat) (dog) hair and dander: Secondary | ICD-10-CM | POA: Diagnosis not present

## 2019-01-28 DIAGNOSIS — J3089 Other allergic rhinitis: Secondary | ICD-10-CM | POA: Diagnosis not present

## 2019-02-01 ENCOUNTER — Ambulatory Visit (INDEPENDENT_AMBULATORY_CARE_PROVIDER_SITE_OTHER): Payer: BLUE CROSS/BLUE SHIELD

## 2019-02-01 ENCOUNTER — Other Ambulatory Visit: Payer: Self-pay

## 2019-02-01 DIAGNOSIS — Z23 Encounter for immunization: Secondary | ICD-10-CM

## 2019-02-02 DIAGNOSIS — F411 Generalized anxiety disorder: Secondary | ICD-10-CM | POA: Diagnosis not present

## 2019-02-04 DIAGNOSIS — J3081 Allergic rhinitis due to animal (cat) (dog) hair and dander: Secondary | ICD-10-CM | POA: Diagnosis not present

## 2019-02-04 DIAGNOSIS — J301 Allergic rhinitis due to pollen: Secondary | ICD-10-CM | POA: Diagnosis not present

## 2019-02-04 DIAGNOSIS — J3089 Other allergic rhinitis: Secondary | ICD-10-CM | POA: Diagnosis not present

## 2019-02-10 ENCOUNTER — Telehealth: Payer: Self-pay

## 2019-02-10 ENCOUNTER — Telehealth: Payer: Self-pay | Admitting: Obstetrics and Gynecology

## 2019-02-10 DIAGNOSIS — R319 Hematuria, unspecified: Secondary | ICD-10-CM | POA: Diagnosis not present

## 2019-02-10 DIAGNOSIS — Z7251 High risk heterosexual behavior: Secondary | ICD-10-CM | POA: Diagnosis not present

## 2019-02-10 DIAGNOSIS — R350 Frequency of micturition: Secondary | ICD-10-CM | POA: Diagnosis not present

## 2019-02-10 DIAGNOSIS — N390 Urinary tract infection, site not specified: Secondary | ICD-10-CM | POA: Diagnosis not present

## 2019-02-10 NOTE — Telephone Encounter (Signed)
Patient left message with advice nurse last night stating she thinks she has UTI, having frequency and is sexually active again.  She wanted to know if something could be called in for her.  No appointments available today.

## 2019-02-10 NOTE — Telephone Encounter (Signed)
Pt was feeling an UTI coming last night, went to Urgent care this morning, and was given antibiotic. But wants to know if its possible you can give her a Rx with some refills for future UTI's, to have them in hand if she gets that UTI sensation in the middle of the night. Please advise.

## 2019-02-10 NOTE — Telephone Encounter (Signed)
Pt aware.

## 2019-02-10 NOTE — Telephone Encounter (Signed)
Preferred pharmacy [facility].

## 2019-02-10 NOTE — Telephone Encounter (Signed)
No. Urine should be checked if has sx.

## 2019-02-10 NOTE — Telephone Encounter (Signed)
Pt should be put on nurse schedule so she can come in for the nurse to check her urine.

## 2019-02-10 NOTE — Telephone Encounter (Signed)
Patient went to urgent care first thing this morning and treated.  Did want to leave msg for [name] to see if she was able to keep something on hand just in case it happened again, transferred to triage to leave msg for [name].

## 2019-02-11 DIAGNOSIS — J301 Allergic rhinitis due to pollen: Secondary | ICD-10-CM | POA: Diagnosis not present

## 2019-02-11 DIAGNOSIS — J3089 Other allergic rhinitis: Secondary | ICD-10-CM | POA: Diagnosis not present

## 2019-02-11 DIAGNOSIS — F411 Generalized anxiety disorder: Secondary | ICD-10-CM | POA: Diagnosis not present

## 2019-02-11 DIAGNOSIS — F4323 Adjustment disorder with mixed anxiety and depressed mood: Secondary | ICD-10-CM | POA: Diagnosis not present

## 2019-02-11 DIAGNOSIS — J3081 Allergic rhinitis due to animal (cat) (dog) hair and dander: Secondary | ICD-10-CM | POA: Diagnosis not present

## 2019-02-18 ENCOUNTER — Other Ambulatory Visit: Payer: Self-pay

## 2019-02-18 ENCOUNTER — Telehealth: Payer: Self-pay

## 2019-02-18 ENCOUNTER — Ambulatory Visit (INDEPENDENT_AMBULATORY_CARE_PROVIDER_SITE_OTHER): Payer: BLUE CROSS/BLUE SHIELD

## 2019-02-18 DIAGNOSIS — R399 Unspecified symptoms and signs involving the genitourinary system: Secondary | ICD-10-CM

## 2019-02-18 DIAGNOSIS — J301 Allergic rhinitis due to pollen: Secondary | ICD-10-CM | POA: Diagnosis not present

## 2019-02-18 DIAGNOSIS — J3081 Allergic rhinitis due to animal (cat) (dog) hair and dander: Secondary | ICD-10-CM | POA: Diagnosis not present

## 2019-02-18 DIAGNOSIS — J3089 Other allergic rhinitis: Secondary | ICD-10-CM | POA: Diagnosis not present

## 2019-02-18 LAB — POCT URINALYSIS DIPSTICK
Appearance: NORMAL
Bilirubin, UA: NEGATIVE
Blood, UA: NEGATIVE
Glucose, UA: NEGATIVE
Ketones, UA: NEGATIVE
Leukocytes, UA: NEGATIVE
Nitrite, UA: NEGATIVE
Odor: NORMAL
Protein, UA: NEGATIVE
Spec Grav, UA: 1.015 (ref 1.010–1.025)
Urobilinogen, UA: 0.2 E.U./dL
pH, UA: 5 (ref 5.0–8.0)

## 2019-02-18 NOTE — Telephone Encounter (Signed)
Patient is schedule 03/21/19

## 2019-02-18 NOTE — Telephone Encounter (Signed)
Pt states she may have another UTI, she had one a few weeks ago, but S&S are not that bad. She was wondering what to do? Does she need to drop off a specimen or can something be called in? CB# [phone]

## 2019-02-18 NOTE — Telephone Encounter (Signed)
Please call pt and schedule nurse visit, thank you.

## 2019-02-18 NOTE — Telephone Encounter (Signed)
She needs UA with nurse. Never came in 5/20.

## 2019-02-18 NOTE — Progress Notes (Signed)
Patient arrived to office and produced urine specimen for urinalysis due to signs & symptoms of UTI unspecified. I did not speak with patient directly.

## 2019-02-19 NOTE — Telephone Encounter (Signed)
Pt aware. She thinks she was paranoid bc she wasn't feeling well & just thought maybe it was a UTI again. She will call with any further concerns.

## 2019-02-19 NOTE — Telephone Encounter (Signed)
LMVM TRC. U/A negative (No UTI) from yesterday.

## 2019-02-25 DIAGNOSIS — J3081 Allergic rhinitis due to animal (cat) (dog) hair and dander: Secondary | ICD-10-CM | POA: Diagnosis not present

## 2019-02-25 DIAGNOSIS — J3089 Other allergic rhinitis: Secondary | ICD-10-CM | POA: Diagnosis not present

## 2019-02-25 DIAGNOSIS — J301 Allergic rhinitis due to pollen: Secondary | ICD-10-CM | POA: Diagnosis not present

## 2019-03-02 DIAGNOSIS — J3089 Other allergic rhinitis: Secondary | ICD-10-CM | POA: Diagnosis not present

## 2019-03-02 DIAGNOSIS — J3081 Allergic rhinitis due to animal (cat) (dog) hair and dander: Secondary | ICD-10-CM | POA: Diagnosis not present

## 2019-03-02 DIAGNOSIS — J301 Allergic rhinitis due to pollen: Secondary | ICD-10-CM | POA: Diagnosis not present

## 2019-03-06 DIAGNOSIS — N390 Urinary tract infection, site not specified: Secondary | ICD-10-CM | POA: Diagnosis not present

## 2019-03-06 DIAGNOSIS — R300 Dysuria: Secondary | ICD-10-CM | POA: Diagnosis not present

## 2019-03-06 DIAGNOSIS — R319 Hematuria, unspecified: Secondary | ICD-10-CM | POA: Diagnosis not present

## 2019-03-09 DIAGNOSIS — J3081 Allergic rhinitis due to animal (cat) (dog) hair and dander: Secondary | ICD-10-CM | POA: Diagnosis not present

## 2019-03-09 DIAGNOSIS — J301 Allergic rhinitis due to pollen: Secondary | ICD-10-CM | POA: Diagnosis not present

## 2019-03-09 DIAGNOSIS — J3089 Other allergic rhinitis: Secondary | ICD-10-CM | POA: Diagnosis not present

## 2019-03-10 ENCOUNTER — Telehealth: Payer: Self-pay

## 2019-03-10 DIAGNOSIS — F411 Generalized anxiety disorder: Secondary | ICD-10-CM | POA: Diagnosis not present

## 2019-03-10 NOTE — Telephone Encounter (Signed)
Spoke with pt. Had UTI about a month ago, went to urgent care. Treated with macrobid with sx relief. Pt got sx again over the wknd and saw urgent care again. Took AZO so UA not accurate. C&S sent off. Pt given macrobid again but didn't start it because felt better. Noticed blood in urine last night so started macrobid and is already feeling better. Pt to check with urgent care re: C&S results. If pos for UTI and on correct abx, then finish Rx. If neg for UTI, rechk UA after completes abx.  Pt with new sex partner and very active. Feels like sex is triggering sx for her. Voiding after sex but not before.

## 2019-03-10 NOTE — Telephone Encounter (Signed)
Patient called our office c/o pain when urinating, blood in urine and a history of UTI's.  She was placed on antibiotics by her provider at [facility] last week.  She will complete the course of antibiotics next Tuesday. She is to follow up on Thursday of next week for a urine check.  Appointment has been made for June 24 @1300  with our clinic.Marland Kitchen

## 2019-03-10 NOTE — Telephone Encounter (Signed)
Please see

## 2019-03-10 NOTE — Telephone Encounter (Signed)
Pt calling; thinks she has a UTI; has blood in urine; pain and discomfort.  She went to Urgent Care over the weekend and was give an antibx which she has started taking.  This is her 2nd UTI in a month.  [phone]

## 2019-03-12 DIAGNOSIS — J301 Allergic rhinitis due to pollen: Secondary | ICD-10-CM | POA: Diagnosis not present

## 2019-03-12 DIAGNOSIS — J3081 Allergic rhinitis due to animal (cat) (dog) hair and dander: Secondary | ICD-10-CM | POA: Diagnosis not present

## 2019-03-15 DIAGNOSIS — J3089 Other allergic rhinitis: Secondary | ICD-10-CM | POA: Diagnosis not present

## 2019-03-16 DIAGNOSIS — J3089 Other allergic rhinitis: Secondary | ICD-10-CM | POA: Diagnosis not present

## 2019-03-16 DIAGNOSIS — J301 Allergic rhinitis due to pollen: Secondary | ICD-10-CM | POA: Diagnosis not present

## 2019-03-16 DIAGNOSIS — J3081 Allergic rhinitis due to animal (cat) (dog) hair and dander: Secondary | ICD-10-CM | POA: Diagnosis not present

## 2019-03-17 ENCOUNTER — Encounter: Payer: Self-pay | Admitting: Nurse Practitioner

## 2019-03-17 ENCOUNTER — Ambulatory Visit: Payer: BLUE CROSS/BLUE SHIELD | Admitting: Nurse Practitioner

## 2019-03-17 ENCOUNTER — Other Ambulatory Visit: Payer: Self-pay

## 2019-03-17 VITALS — BP 113/63 | HR 65 | Temp 98.2°F | Resp 16 | Ht 66.0 in | Wt 116.0 lb

## 2019-03-17 DIAGNOSIS — F411 Generalized anxiety disorder: Secondary | ICD-10-CM | POA: Diagnosis not present

## 2019-03-17 DIAGNOSIS — Z87440 Personal history of urinary (tract) infections: Secondary | ICD-10-CM

## 2019-03-17 LAB — POCT URINALYSIS DIPSTICK
Appearance: NORMAL
Bilirubin, UA: NEGATIVE
Blood, UA: NEGATIVE
Glucose, UA: NEGATIVE
Ketones, UA: NEGATIVE
Leukocytes, UA: NEGATIVE
Nitrite, UA: NEGATIVE
Protein, UA: NEGATIVE
Spec Grav, UA: 1.01 (ref 1.010–1.025)
Urobilinogen, UA: 0.2 E.U./dL
pH, UA: 6 (ref 5.0–8.0)

## 2019-03-17 NOTE — Patient Instructions (Addendum)
-Veronica Gibson your urinalysis was negative today for bacteria so your recent antibiotic treatment resolved the UTI. -I encourage you to continue good hygiene and consider condom use or showering prior to sex, along with pre and post urination -Please call the number provided if you choose to find a PCP that's not your gyn, you can always come here for acute issues as well -Please see the education below regarding UTI's that may help reduce any future infections -Encouraged patient to call the office or primary care doctor for an appointment if no improvement in symptoms or if symptoms change or worsen after 72 hours of planned treatment. Patient verbalized understanding of all instructions given/reviewed and has no further questions or concerns at this time.     Urinary Tract Infection, Adult   A urinary tract infection (UTI) is an infection of any part of the urinary tract. The urinary tract includes the kidneys, ureters, bladder, and urethra. These organs make, store, and get rid of urine in the body. Your health care provider may use other names to describe the infection. An upper UTI affects the ureters and kidneys (pyelonephritis). A lower UTI affects the bladder (cystitis) and urethra (urethritis). What are the causes? Most urinary tract infections are caused by bacteria in your genital area, around the entrance to your urinary tract (urethra). These bacteria grow and cause inflammation of your urinary tract. What increases the risk? You are more likely to develop this condition if:  You have a urinary catheter that stays in place (indwelling).  You are not able to control when you urinate or have a bowel movement (you have incontinence).  You are female and you: ? Use a spermicide or diaphragm for birth control. ? Have low estrogen levels. ? Are pregnant.  You have certain genes that increase your risk (genetics).  You are sexually active.  You take antibiotic medicines.  You have a  condition that causes your flow of urine to slow down, such as: ? An enlarged prostate, if you are female. ? Blockage in your urethra (stricture). ? A kidney stone. ? A nerve condition that affects your bladder control (neurogenic bladder). ? Not getting enough to drink, or not urinating often.  You have certain medical conditions, such as: ? Diabetes. ? A weak disease-fighting system (immunesystem). ? Sickle cell disease. ? Gout. ? Spinal cord injury. What are the signs or symptoms? Symptoms of this condition include:  Needing to urinate right away (urgently).  Frequent urination or passing small amounts of urine frequently.  Pain or burning with urination.  Blood in the urine.  Urine that smells bad or unusual.  Trouble urinating.  Cloudy urine.  Vaginal discharge, if you are female.  Pain in the abdomen or the lower back. You may also have:  Vomiting or a decreased appetite.  Confusion.  Irritability or tiredness.  A fever.  Diarrhea. The first symptom in older adults may be confusion. In some cases, they may not have any symptoms until the infection has worsened. How is this diagnosed? This condition is diagnosed based on your medical history and a physical exam. You may also have other tests, including:  Urine tests.  Blood tests.  Tests for sexually transmitted infections (STIs). If you have had more than one UTI, a cystoscopy or imaging studies may be done to determine the cause of the infections. How is this treated? Treatment for this condition includes:  Antibiotic medicine.  Over-the-counter medicines to treat discomfort.  Drinking enough water to stay  hydrated. If you have frequent infections or have other conditions such as a kidney stone, you may need to see a health care provider who specializes in the urinary tract (urologist). In rare cases, urinary tract infections can cause sepsis. Sepsis is a life-threatening condition that occurs when  the body responds to an infection. Sepsis is treated in the hospital with IV antibiotics, fluids, and other medicines. Follow these instructions at home:   Medicines  Take over-the-counter and prescription medicines only as told by your health care provider.  If you were prescribed an antibiotic medicine, take it as told by your health care provider. Do not stop using the antibiotic even if you start to feel better. General instructions  Make sure you: ? Empty your bladder often and completely. Do not hold urine for long periods of time. ? Empty your bladder after sex. ? Wipe from front to back after a bowel movement if you are female. Use each tissue one time when you wipe.  Drink enough fluid to keep your urine pale yellow.  Keep all follow-up visits as told by your health care provider. This is important. Contact a health care provider if:  Your symptoms do not get better after 1-2 days.  Your symptoms go away and then return. Get help right away if you have:  Severe pain in your back or your lower abdomen.  A fever.  Nausea or vomiting. Summary  A urinary tract infection (UTI) is an infection of any part of the urinary tract, which includes the kidneys, ureters, bladder, and urethra.  Most urinary tract infections are caused by bacteria in your genital area, around the entrance to your urinary tract (urethra).  Treatment for this condition often includes antibiotic medicines.  If you were prescribed an antibiotic medicine, take it as told by your health care provider. Do not stop using the antibiotic even if you start to feel better.  Keep all follow-up visits as told by your health care provider. This is important. This information is not intended to replace advice given to you by your health care provider. Make sure you discuss any questions you have with your health care provider. Document Released: 06/19/2005 Document Revised: 03/19/2018 Document Reviewed:  03/19/2018 Elsevier Interactive Patient Education  2019 [employer].

## 2019-03-17 NOTE — Progress Notes (Signed)
    Subjective:    Patient ID: Veronica Gibson, female    DOB: 05-Jan-1976, 43 y.o.   MRN: [id number]  HPI Veronica Gibson is here today after eval by gyn 6/17 which she has repeat UTI. Was recommended ua check after treatment completion of nitrofurantoin. She's here today to f/u from UTI. She has had 2 UTI's in the last 2 months with a new sex partner since 01/2019. She admits UTI May and June. Does not use condoms but has been recently tested for gc/chlam. Denies any current hematuria except when she runs and has intercourse she has some but does not need to use a "liner" and shortly resolves. She denies any dysuria, urinary frequency/urgency or abdominal pain  LMP: mirena: no menses    Review of Systems  Constitutional: Negative for fatigue and fever.  Respiratory: Negative for cough and shortness of breath.   Gastrointestinal: Negative for abdominal pain, diarrhea, nausea and vomiting.  Genitourinary: Positive for hematuria. Negative for flank pain, frequency and urgency.       Objective:   Physical Exam Constitutional:      General: She is not in acute distress.    Appearance: She is normal weight.  HENT:     Head: Normocephalic and atraumatic.  Neck:     Musculoskeletal: Normal range of motion and neck supple.  Cardiovascular:     Rate and Rhythm: Normal rate.  Pulmonary:     Effort: Pulmonary effort is normal. No respiratory distress.  Abdominal:     Tenderness: There is no right CVA tenderness or left CVA tenderness.  Skin:    General: Skin is warm and dry.  Neurological:     General: No focal deficit present.     Mental Status: She is alert.  Psychiatric:        Mood and Affect: Mood normal.           Assessment & Plan:

## 2019-03-23 DIAGNOSIS — J301 Allergic rhinitis due to pollen: Secondary | ICD-10-CM | POA: Diagnosis not present

## 2019-03-23 DIAGNOSIS — J3089 Other allergic rhinitis: Secondary | ICD-10-CM | POA: Diagnosis not present

## 2019-03-23 DIAGNOSIS — J3081 Allergic rhinitis due to animal (cat) (dog) hair and dander: Secondary | ICD-10-CM | POA: Diagnosis not present

## 2019-03-30 DIAGNOSIS — J3081 Allergic rhinitis due to animal (cat) (dog) hair and dander: Secondary | ICD-10-CM | POA: Diagnosis not present

## 2019-03-30 DIAGNOSIS — J301 Allergic rhinitis due to pollen: Secondary | ICD-10-CM | POA: Diagnosis not present

## 2019-03-30 DIAGNOSIS — J3089 Other allergic rhinitis: Secondary | ICD-10-CM | POA: Diagnosis not present

## 2019-03-31 DIAGNOSIS — F411 Generalized anxiety disorder: Secondary | ICD-10-CM | POA: Diagnosis not present

## 2019-04-01 DIAGNOSIS — J3081 Allergic rhinitis due to animal (cat) (dog) hair and dander: Secondary | ICD-10-CM | POA: Diagnosis not present

## 2019-04-01 DIAGNOSIS — J301 Allergic rhinitis due to pollen: Secondary | ICD-10-CM | POA: Diagnosis not present

## 2019-04-01 DIAGNOSIS — J3089 Other allergic rhinitis: Secondary | ICD-10-CM | POA: Diagnosis not present

## 2019-04-06 DIAGNOSIS — J3089 Other allergic rhinitis: Secondary | ICD-10-CM | POA: Diagnosis not present

## 2019-04-06 DIAGNOSIS — J3081 Allergic rhinitis due to animal (cat) (dog) hair and dander: Secondary | ICD-10-CM | POA: Diagnosis not present

## 2019-04-06 DIAGNOSIS — J301 Allergic rhinitis due to pollen: Secondary | ICD-10-CM | POA: Diagnosis not present

## 2019-04-08 DIAGNOSIS — J3089 Other allergic rhinitis: Secondary | ICD-10-CM | POA: Diagnosis not present

## 2019-04-08 DIAGNOSIS — J3081 Allergic rhinitis due to animal (cat) (dog) hair and dander: Secondary | ICD-10-CM | POA: Diagnosis not present

## 2019-04-08 DIAGNOSIS — J301 Allergic rhinitis due to pollen: Secondary | ICD-10-CM | POA: Diagnosis not present

## 2019-04-15 DIAGNOSIS — J3081 Allergic rhinitis due to animal (cat) (dog) hair and dander: Secondary | ICD-10-CM | POA: Diagnosis not present

## 2019-04-15 DIAGNOSIS — J301 Allergic rhinitis due to pollen: Secondary | ICD-10-CM | POA: Diagnosis not present

## 2019-04-15 DIAGNOSIS — J3089 Other allergic rhinitis: Secondary | ICD-10-CM | POA: Diagnosis not present

## 2019-04-20 DIAGNOSIS — J3081 Allergic rhinitis due to animal (cat) (dog) hair and dander: Secondary | ICD-10-CM | POA: Diagnosis not present

## 2019-04-20 DIAGNOSIS — F411 Generalized anxiety disorder: Secondary | ICD-10-CM | POA: Diagnosis not present

## 2019-04-20 DIAGNOSIS — J3089 Other allergic rhinitis: Secondary | ICD-10-CM | POA: Diagnosis not present

## 2019-04-20 DIAGNOSIS — J301 Allergic rhinitis due to pollen: Secondary | ICD-10-CM | POA: Diagnosis not present

## 2019-04-27 DIAGNOSIS — J301 Allergic rhinitis due to pollen: Secondary | ICD-10-CM | POA: Diagnosis not present

## 2019-04-27 DIAGNOSIS — J3089 Other allergic rhinitis: Secondary | ICD-10-CM | POA: Diagnosis not present

## 2019-05-04 DIAGNOSIS — J3081 Allergic rhinitis due to animal (cat) (dog) hair and dander: Secondary | ICD-10-CM | POA: Diagnosis not present

## 2019-05-04 DIAGNOSIS — J3089 Other allergic rhinitis: Secondary | ICD-10-CM | POA: Diagnosis not present

## 2019-05-04 DIAGNOSIS — J301 Allergic rhinitis due to pollen: Secondary | ICD-10-CM | POA: Diagnosis not present

## 2019-05-11 DIAGNOSIS — J301 Allergic rhinitis due to pollen: Secondary | ICD-10-CM | POA: Diagnosis not present

## 2019-05-11 DIAGNOSIS — J3089 Other allergic rhinitis: Secondary | ICD-10-CM | POA: Diagnosis not present

## 2019-05-11 DIAGNOSIS — J3081 Allergic rhinitis due to animal (cat) (dog) hair and dander: Secondary | ICD-10-CM | POA: Diagnosis not present

## 2019-05-11 DIAGNOSIS — F411 Generalized anxiety disorder: Secondary | ICD-10-CM | POA: Diagnosis not present

## 2019-05-12 DIAGNOSIS — F411 Generalized anxiety disorder: Secondary | ICD-10-CM | POA: Diagnosis not present

## 2019-05-20 DIAGNOSIS — J301 Allergic rhinitis due to pollen: Secondary | ICD-10-CM | POA: Diagnosis not present

## 2019-05-20 DIAGNOSIS — J3081 Allergic rhinitis due to animal (cat) (dog) hair and dander: Secondary | ICD-10-CM | POA: Diagnosis not present

## 2019-05-20 DIAGNOSIS — J3089 Other allergic rhinitis: Secondary | ICD-10-CM | POA: Diagnosis not present

## 2019-05-25 DIAGNOSIS — J301 Allergic rhinitis due to pollen: Secondary | ICD-10-CM | POA: Diagnosis not present

## 2019-05-25 DIAGNOSIS — J3081 Allergic rhinitis due to animal (cat) (dog) hair and dander: Secondary | ICD-10-CM | POA: Diagnosis not present

## 2019-05-25 DIAGNOSIS — J3089 Other allergic rhinitis: Secondary | ICD-10-CM | POA: Diagnosis not present

## 2019-06-02 DIAGNOSIS — F411 Generalized anxiety disorder: Secondary | ICD-10-CM | POA: Diagnosis not present

## 2019-06-04 DIAGNOSIS — J3081 Allergic rhinitis due to animal (cat) (dog) hair and dander: Secondary | ICD-10-CM | POA: Diagnosis not present

## 2019-06-04 DIAGNOSIS — J301 Allergic rhinitis due to pollen: Secondary | ICD-10-CM | POA: Diagnosis not present

## 2019-06-04 DIAGNOSIS — J3089 Other allergic rhinitis: Secondary | ICD-10-CM | POA: Diagnosis not present

## 2019-06-10 DIAGNOSIS — J3089 Other allergic rhinitis: Secondary | ICD-10-CM | POA: Diagnosis not present

## 2019-06-10 DIAGNOSIS — J301 Allergic rhinitis due to pollen: Secondary | ICD-10-CM | POA: Diagnosis not present

## 2019-06-10 DIAGNOSIS — J3081 Allergic rhinitis due to animal (cat) (dog) hair and dander: Secondary | ICD-10-CM | POA: Diagnosis not present

## 2019-06-15 DIAGNOSIS — J3089 Other allergic rhinitis: Secondary | ICD-10-CM | POA: Diagnosis not present

## 2019-06-15 DIAGNOSIS — J301 Allergic rhinitis due to pollen: Secondary | ICD-10-CM | POA: Diagnosis not present

## 2019-06-15 DIAGNOSIS — J3081 Allergic rhinitis due to animal (cat) (dog) hair and dander: Secondary | ICD-10-CM | POA: Diagnosis not present

## 2019-06-22 DIAGNOSIS — J3089 Other allergic rhinitis: Secondary | ICD-10-CM | POA: Diagnosis not present

## 2019-06-22 DIAGNOSIS — J3081 Allergic rhinitis due to animal (cat) (dog) hair and dander: Secondary | ICD-10-CM | POA: Diagnosis not present

## 2019-06-22 DIAGNOSIS — J301 Allergic rhinitis due to pollen: Secondary | ICD-10-CM | POA: Diagnosis not present

## 2019-06-23 DIAGNOSIS — R350 Frequency of micturition: Secondary | ICD-10-CM | POA: Diagnosis not present

## 2019-06-28 DIAGNOSIS — F411 Generalized anxiety disorder: Secondary | ICD-10-CM | POA: Diagnosis not present

## 2019-07-01 DIAGNOSIS — J3081 Allergic rhinitis due to animal (cat) (dog) hair and dander: Secondary | ICD-10-CM | POA: Diagnosis not present

## 2019-07-01 DIAGNOSIS — J3089 Other allergic rhinitis: Secondary | ICD-10-CM | POA: Diagnosis not present

## 2019-07-01 DIAGNOSIS — J301 Allergic rhinitis due to pollen: Secondary | ICD-10-CM | POA: Diagnosis not present

## 2019-07-06 DIAGNOSIS — J3081 Allergic rhinitis due to animal (cat) (dog) hair and dander: Secondary | ICD-10-CM | POA: Diagnosis not present

## 2019-07-06 DIAGNOSIS — J301 Allergic rhinitis due to pollen: Secondary | ICD-10-CM | POA: Diagnosis not present

## 2019-07-06 DIAGNOSIS — J3089 Other allergic rhinitis: Secondary | ICD-10-CM | POA: Diagnosis not present

## 2019-07-08 DIAGNOSIS — J3089 Other allergic rhinitis: Secondary | ICD-10-CM | POA: Diagnosis not present

## 2019-07-08 DIAGNOSIS — J301 Allergic rhinitis due to pollen: Secondary | ICD-10-CM | POA: Diagnosis not present

## 2019-07-08 DIAGNOSIS — J3081 Allergic rhinitis due to animal (cat) (dog) hair and dander: Secondary | ICD-10-CM | POA: Diagnosis not present

## 2019-07-09 ENCOUNTER — Telehealth: Payer: Self-pay

## 2019-07-09 NOTE — Telephone Encounter (Signed)
Pt calling; woke up this am c fever of 100; now 102; is really tired, has H/A, fever.  Please advise.  [phone]  [facility] # given.

## 2019-07-10 DIAGNOSIS — Z20828 Contact with and (suspected) exposure to other viral communicable diseases: Secondary | ICD-10-CM | POA: Diagnosis not present

## 2019-07-22 DIAGNOSIS — J301 Allergic rhinitis due to pollen: Secondary | ICD-10-CM | POA: Diagnosis not present

## 2019-07-22 DIAGNOSIS — J3089 Other allergic rhinitis: Secondary | ICD-10-CM | POA: Diagnosis not present

## 2019-07-22 DIAGNOSIS — J3081 Allergic rhinitis due to animal (cat) (dog) hair and dander: Secondary | ICD-10-CM | POA: Diagnosis not present

## 2019-07-27 DIAGNOSIS — J3081 Allergic rhinitis due to animal (cat) (dog) hair and dander: Secondary | ICD-10-CM | POA: Diagnosis not present

## 2019-07-27 DIAGNOSIS — J3089 Other allergic rhinitis: Secondary | ICD-10-CM | POA: Diagnosis not present

## 2019-07-27 DIAGNOSIS — J301 Allergic rhinitis due to pollen: Secondary | ICD-10-CM | POA: Diagnosis not present

## 2019-08-02 DIAGNOSIS — F411 Generalized anxiety disorder: Secondary | ICD-10-CM | POA: Diagnosis not present

## 2019-08-03 DIAGNOSIS — J3081 Allergic rhinitis due to animal (cat) (dog) hair and dander: Secondary | ICD-10-CM | POA: Diagnosis not present

## 2019-08-03 DIAGNOSIS — J301 Allergic rhinitis due to pollen: Secondary | ICD-10-CM | POA: Diagnosis not present

## 2019-08-03 DIAGNOSIS — J3089 Other allergic rhinitis: Secondary | ICD-10-CM | POA: Diagnosis not present

## 2019-08-04 NOTE — Progress Notes (Signed)
Chief Complaint  Patient presents with  . Gynecologic Exam     HPI:      [name] is a 43 y.o. [phone] who LMP was No LMP recorded. (Menstrual status: IUD)., presents today for her annual examination.  Her menses are absent with IUD, placed 3/19. Sometimes has spotting with running or after sex.  Dysmenorrhea/pelvic pain none.   Sex activity: single partner, contraception - IUD. Mirena placed 11/25/17.  Last Pap: 05/06/17 Results were: no abnormalities /neg HPV DNA  Hx of STDs: none  Had 2 UTIs earlier this yr and several bouts of UTI sx with neg C&S. Trying to void before and after sex, drinking more water, and taking cranberry supp. No recent sx.   Last mammo: 10/22/18 Results were normal, repeat in 12 months There is no FH of breast cancer. There is no FH of ovarian cancer. The patient does not do self-breast exams.  Tobacco use: The patient denies current or previous tobacco use. Alcohol use: social drinker  No drug use. Exercise: moderately active  She does get adequate calcium and Vitamin D in her diet.   Past Medical History:  Diagnosis Date  . Anxiety   . Head ache   . History of mammogram 12/13/2013   CAT 2  . History of Papanicolaou smear of cervix 04/07/2014   -/-  . Rupture Achilles tendon 08/14/2017   Right  . Seasonal allergies   . Vaccine for human papilloma virus (HPV) types 6, 11, 16, and 18 administered     Past Surgical History:  Procedure Laterality Date  . ACHILLES TENDON REPAIR  08/18/2017  . WISDOM TOOTH EXTRACTION      Family History  Problem Relation Age of Onset  . Cancer Paternal Grandmother 49       UTERINE  . Coronary artery disease Paternal Grandfather        CABG  . Cancer Paternal Aunt        uterine  . Cancer Other        uterine    Social History   Socioeconomic History  . Marital status: Divorced    Spouse name: Not on file  . Number of children: 2  . Years of education: 56  . Highest education level: Not  on file  Occupational History  . Occupation: Designer, jewellery: [employer]  . Financial resource strain: Not on file  . Food insecurity    Worry: Not on file    Inability: Not on file  . Transportation needs    Medical: Not on file    Non-medical: Not on file  Tobacco Use  . Smoking status: Never Smoker  . Smokeless tobacco: Never Used  Substance and Sexual Activity  . Alcohol use: Yes    Comment: OCC  . Drug use: No  . Sexual activity: Yes    Birth control/protection: Abstinence, I.U.D.    Comment: Mirena  Lifestyle  . Physical activity    Days per week: 0 days    Minutes per session: 0 min  . Stress: Only a little  Relationships  . Social Herbalist on phone: Not on file    Gets together: Not on file    Attends religious service: Not on file    Active member of club or organization: Not on file    Attends meetings of clubs or organizations: Not on file    Relationship status: Not on file  .  Intimate partner violence    Fear of current or ex partner: Not on file    Emotionally abused: Not on file    Physically abused: Not on file    Forced sexual activity: Not on file  Other Topics Concern  . Not on file  Social History Narrative  . Not on file     Current Outpatient Medications:  .  azelastine (ASTELIN) 0.1 % nasal spray, SMARTSIG:1-2 Spray(s) Both Nares Twice Daily, Disp: , Rfl:  .  fexofenadine (QC FEXOFENADINE HYDROCHLORIDE) 180 MG tablet, , Disp: , Rfl:  .  fluticasone (FLONASE) 50 MCG/ACT nasal spray, PLACE 1-2 SPRAY IN EACH NOSTRIL ONCE A DAY, Disp: , Rfl: 3 .  loratadine (CLARITIN) 10 MG tablet, Take 10 mg by mouth 2 (two) times daily. , Disp: , Rfl:  .  levonorgestrel (MIRENA, 52 MG,) 20 MCG/24HR IUD, 1 Intra Uterine Device (1 each total) by Intrauterine route once for 1 dose., Disp: 1 Intra Uterine Device, Rfl: 0  ROS:  Review of Systems  Constitutional: Negative for fatigue, fever and unexpected weight change.   Respiratory: Negative for cough, shortness of breath and wheezing.   Cardiovascular: Negative for chest pain, palpitations and leg swelling.  Gastrointestinal: Negative for blood in stool, constipation, diarrhea, nausea and vomiting.  Endocrine: Negative for cold intolerance, heat intolerance and polyuria.  Genitourinary: Negative for dyspareunia, dysuria, flank pain, frequency, genital sores, hematuria, menstrual problem, pelvic pain, urgency, vaginal bleeding, vaginal discharge and vaginal pain.  Musculoskeletal: Negative for back pain, joint swelling and myalgias.  Skin: Negative for rash.  Neurological: Negative for dizziness, syncope, light-headedness, numbness and headaches.  Hematological: Negative for adenopathy.  Psychiatric/Behavioral: Positive for agitation. Negative for confusion, dysphoric mood, sleep disturbance and suicidal ideas. The patient is not nervous/anxious.      Objective: BP 90/60   Ht 5\' 6"  (1.676 m)   Wt 120 lb (54.4 kg)   BMI 19.37 kg/m    Physical Exam Constitutional:      Appearance: She is well-developed.  Genitourinary:     Vulva, vagina, uterus, right adnexa and left adnexa normal.     No vulval lesion or tenderness noted.     No vaginal discharge, erythema or tenderness.     No cervical motion tenderness or polyp.     IUD strings visualized.     Uterus is not enlarged or tender.     No right or left adnexal mass present.     Right adnexa not tender.     Left adnexa not tender.  Neck:     Musculoskeletal: Normal range of motion.     Thyroid: No thyromegaly.  Cardiovascular:     Rate and Rhythm: Normal rate and regular rhythm.     Heart sounds: Normal heart sounds. No murmur.  Pulmonary:     Effort: Pulmonary effort is normal.     Breath sounds: Normal breath sounds.  Chest:     Breasts:        Right: No mass, nipple discharge, skin change or tenderness.        Left: No mass, nipple discharge, skin change or tenderness.  Abdominal:      Palpations: Abdomen is soft.     Tenderness: There is no abdominal tenderness. There is no guarding.  Musculoskeletal: Normal range of motion.  Neurological:     General: No focal deficit present.     Mental Status: She is alert and oriented to person, place, and time.     Cranial  Nerves: No cranial nerve deficit.  Skin:    General: Skin is warm and dry.  Psychiatric:        Mood and Affect: Mood normal.        Behavior: Behavior normal.        Thought Content: Thought content normal.        Judgment: Judgment normal.  Vitals signs reviewed.     Assessment/Plan: Encounter for annual routine gynecological examination  Encounter for routine checking of intrauterine contraceptive device (IUD), IUD in place, due for removal 3/25.  Encounter for screening mammogram for malignant neoplasm of breast - Plan: 3D MAMMOGRAM SCREENING BILATERAL; pt to sched mammo.              GYN counsel mammography screening, adequate intake of calcium and vitamin D    F/U  Return in about 1 year (around 08/04/2020).  Januel Doolan B. Ilene Witcher, PA-C 08/05/2019 9:38 AM

## 2019-08-05 ENCOUNTER — Encounter: Payer: Self-pay | Admitting: Obstetrics and Gynecology

## 2019-08-05 ENCOUNTER — Ambulatory Visit (INDEPENDENT_AMBULATORY_CARE_PROVIDER_SITE_OTHER): Payer: BC Managed Care – PPO | Admitting: Obstetrics and Gynecology

## 2019-08-05 ENCOUNTER — Other Ambulatory Visit: Payer: Self-pay

## 2019-08-05 VITALS — BP 90/60 | Ht 66.0 in | Wt 120.0 lb

## 2019-08-05 DIAGNOSIS — Z1231 Encounter for screening mammogram for malignant neoplasm of breast: Secondary | ICD-10-CM

## 2019-08-05 DIAGNOSIS — Z30431 Encounter for routine checking of intrauterine contraceptive device: Secondary | ICD-10-CM

## 2019-08-05 DIAGNOSIS — Z01419 Encounter for gynecological examination (general) (routine) without abnormal findings: Secondary | ICD-10-CM | POA: Diagnosis not present

## 2019-08-05 NOTE — Patient Instructions (Signed)
I value your feedback and entrusting us with your care. If you get a [address] patient survey, I would appreciate you taking the time to let us know about your experience today. Thank you!  [facility]: [phone]    

## 2019-08-10 DIAGNOSIS — J301 Allergic rhinitis due to pollen: Secondary | ICD-10-CM | POA: Diagnosis not present

## 2019-08-10 DIAGNOSIS — J3081 Allergic rhinitis due to animal (cat) (dog) hair and dander: Secondary | ICD-10-CM | POA: Diagnosis not present

## 2019-08-10 DIAGNOSIS — J3089 Other allergic rhinitis: Secondary | ICD-10-CM | POA: Diagnosis not present

## 2019-08-11 DIAGNOSIS — F411 Generalized anxiety disorder: Secondary | ICD-10-CM | POA: Diagnosis not present

## 2019-08-24 DIAGNOSIS — J3089 Other allergic rhinitis: Secondary | ICD-10-CM | POA: Diagnosis not present

## 2019-08-24 DIAGNOSIS — J3081 Allergic rhinitis due to animal (cat) (dog) hair and dander: Secondary | ICD-10-CM | POA: Diagnosis not present

## 2019-08-24 DIAGNOSIS — F411 Generalized anxiety disorder: Secondary | ICD-10-CM | POA: Diagnosis not present

## 2019-08-24 DIAGNOSIS — J301 Allergic rhinitis due to pollen: Secondary | ICD-10-CM | POA: Diagnosis not present

## 2019-08-26 DIAGNOSIS — J3089 Other allergic rhinitis: Secondary | ICD-10-CM | POA: Diagnosis not present

## 2019-08-26 DIAGNOSIS — J301 Allergic rhinitis due to pollen: Secondary | ICD-10-CM | POA: Diagnosis not present

## 2019-08-26 DIAGNOSIS — J3081 Allergic rhinitis due to animal (cat) (dog) hair and dander: Secondary | ICD-10-CM | POA: Diagnosis not present

## 2019-08-31 DIAGNOSIS — J301 Allergic rhinitis due to pollen: Secondary | ICD-10-CM | POA: Diagnosis not present

## 2019-08-31 DIAGNOSIS — J3089 Other allergic rhinitis: Secondary | ICD-10-CM | POA: Diagnosis not present

## 2019-08-31 DIAGNOSIS — J3081 Allergic rhinitis due to animal (cat) (dog) hair and dander: Secondary | ICD-10-CM | POA: Diagnosis not present

## 2019-09-02 ENCOUNTER — Other Ambulatory Visit: Payer: Self-pay

## 2019-09-02 ENCOUNTER — Ambulatory Visit: Payer: BC Managed Care – PPO | Admitting: Pediatrics

## 2019-09-02 VITALS — BP 104/68 | Ht 66.0 in | Wt 120.0 lb

## 2019-09-02 DIAGNOSIS — M222X1 Patellofemoral disorders, right knee: Secondary | ICD-10-CM | POA: Diagnosis not present

## 2019-09-02 DIAGNOSIS — J301 Allergic rhinitis due to pollen: Secondary | ICD-10-CM | POA: Diagnosis not present

## 2019-09-02 DIAGNOSIS — J3089 Other allergic rhinitis: Secondary | ICD-10-CM | POA: Diagnosis not present

## 2019-09-02 DIAGNOSIS — J3081 Allergic rhinitis due to animal (cat) (dog) hair and dander: Secondary | ICD-10-CM | POA: Diagnosis not present

## 2019-09-02 NOTE — Progress Notes (Signed)
  Veronica Gibson - 43 y.o. 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 She reports that she has had chronic knee pain for years. She has pain over anterior knee, worse with running. She occasionally has pain with rest. No swelling, locking, catching, feelings of instability. She is wearing a knee sleeve that helps some. She reports that she was doing quad exercises more frequently when she was running more several months ago but since she has been running less she is not doing as many strengthening exercises.    ROS: No unexpected weight loss, fever, chills, swelling, instability, muscle pain, numbness/tingling, redness, otherwise see HPI   PMHx - Updated and reviewed.  Contributory factors include: Negative PSHx - Updated and reviewed.  Contributory factors include:  Negative FHx - Updated and reviewed.  Contributory factors include:  Negative Social Hx - Updated and reviewed. Contributory factors include: Negative Medications - reviewed    PHYSICAL EXAM:  VS: BP:104/68  HR: bpm  TEMP: ( )  RESP:   HT:5\' 6"  (167.6 cm)   WT:120 lb (54.4 kg)  BMI:19.38 PHYSICAL EXAM: Gen: NAD, alert, cooperative with exam, well-appearing HEENT: clear conjunctiva,  CV:  no edema, capillary refill brisk, normal rate Resp: non-labored Skin: no rashes, normal turgor  Neuro: no gross deficits.  Psych:  alert and oriented  Knee: - Inspection: no gross deformity. No swelling/effusion, erythema or bruising. 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 Reviewed strengthening exercises including isometric quad and hip abduction exercises. In addition, she needs new orthotics as she is currently using ones made 6 years ago. Instructed her to make an appointment for these upcoming weeks for new orthotics. Return PRN.   I was the preceptor for this visit and available for immediate consultation Shellia Cleverly, DO

## 2019-09-07 DIAGNOSIS — J301 Allergic rhinitis due to pollen: Secondary | ICD-10-CM | POA: Diagnosis not present

## 2019-09-07 DIAGNOSIS — J3089 Other allergic rhinitis: Secondary | ICD-10-CM | POA: Diagnosis not present

## 2019-09-07 DIAGNOSIS — J3081 Allergic rhinitis due to animal (cat) (dog) hair and dander: Secondary | ICD-10-CM | POA: Diagnosis not present

## 2019-09-14 DIAGNOSIS — J301 Allergic rhinitis due to pollen: Secondary | ICD-10-CM | POA: Diagnosis not present

## 2019-09-14 DIAGNOSIS — J3081 Allergic rhinitis due to animal (cat) (dog) hair and dander: Secondary | ICD-10-CM | POA: Diagnosis not present

## 2019-09-14 DIAGNOSIS — F411 Generalized anxiety disorder: Secondary | ICD-10-CM | POA: Diagnosis not present

## 2019-09-14 DIAGNOSIS — J3089 Other allergic rhinitis: Secondary | ICD-10-CM | POA: Diagnosis not present

## 2019-09-28 DIAGNOSIS — J3089 Other allergic rhinitis: Secondary | ICD-10-CM | POA: Diagnosis not present

## 2019-09-28 DIAGNOSIS — J301 Allergic rhinitis due to pollen: Secondary | ICD-10-CM | POA: Diagnosis not present

## 2019-09-28 DIAGNOSIS — J3081 Allergic rhinitis due to animal (cat) (dog) hair and dander: Secondary | ICD-10-CM | POA: Diagnosis not present

## 2019-09-30 ENCOUNTER — Other Ambulatory Visit: Payer: Self-pay

## 2019-09-30 ENCOUNTER — Ambulatory Visit: Payer: BC Managed Care – PPO | Admitting: Pediatrics

## 2019-09-30 DIAGNOSIS — M7741 Metatarsalgia, right foot: Secondary | ICD-10-CM | POA: Diagnosis not present

## 2019-09-30 DIAGNOSIS — M7742 Metatarsalgia, left foot: Secondary | ICD-10-CM | POA: Diagnosis not present

## 2019-09-30 NOTE — Progress Notes (Signed)
  Laisa Monzingo Carriero - 44 y.o. 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 She was seen in office on 12/10 for patellofemoral syndrome and at that appointment she had orthotics made 6 years ago that were very worn. She returns today for a new pair as the old ones have served her well.  ROS: No unexpected weight loss, fever, chills, swelling, instability, muscle pain, numbness/tingling, redness, otherwise see HPI   PMHx - Updated and reviewed.  Contributory factors include: Negative PSHx - Updated and reviewed.  Contributory factors include:  Negative FHx - Updated and reviewed.  Contributory factors include:  Negative Social Hx - Updated and reviewed. Contributory factors include: Negative Medications - reviewed    PHYSICAL EXAM:  VS: BP:118/74  HR: bpm  TEMP: ( )  RESP:   HT:5\' 6"  (167.6 cm)   WT:120 lb (54.4 kg)  BMI:19.38 PHYSICAL EXAM: Gen: NAD, alert, cooperative with exam, well-appearing HEENT: clear conjunctiva,  CV:  no edema, capillary refill brisk, normal rate Resp: non-labored Skin: no rashes, normal turgor  Neuro: no gross deficits.  Psych:  alert and oriented  Bilateral feet: Inspection:  At rest, pes cavus with 1st TMT bossing. With standing, transverse arch flattens Palpation: No tenderness to palpation ROM: Full  ROM of the ankle. Normal midfoot flexibility Strength: 5/5 strength ankle in all planes Neurovascular: N/V intact distally in the lower extremity Special tests:  Normal calcaneal motion with heel raise  ASSESSMENT & PLAN:   Patient was fitted for a : standard, cushioned, semi-rigid orthotic. The orthotic was heated and afterward the patient stood on the orthotic blank positioned on the orthotic stand. The patient was positioned in subtalar neutral position and 10 degrees of ankle dorsiflexion in a weight bearing stance. After completion of molding, a stable base was  applied to the orthotic blank. The blank was ground to a stable position for weight bearing. Size: 7 Base: blue EVA Posting: lateral 5th ray post Additional orthotic padding: medial heel wedges and small metatarsal pads bilaterally  Gait was neutral with orthotics in place. Patient found them to be comfortable. Follow-up as needed.  I was the preceptor for this visit and available for immediate consultation Shellia Cleverly, DO

## 2019-10-05 DIAGNOSIS — J301 Allergic rhinitis due to pollen: Secondary | ICD-10-CM | POA: Diagnosis not present

## 2019-10-05 DIAGNOSIS — J3089 Other allergic rhinitis: Secondary | ICD-10-CM | POA: Diagnosis not present

## 2019-10-05 DIAGNOSIS — J3081 Allergic rhinitis due to animal (cat) (dog) hair and dander: Secondary | ICD-10-CM | POA: Diagnosis not present

## 2019-10-05 DIAGNOSIS — F411 Generalized anxiety disorder: Secondary | ICD-10-CM | POA: Diagnosis not present

## 2019-10-12 DIAGNOSIS — J301 Allergic rhinitis due to pollen: Secondary | ICD-10-CM | POA: Diagnosis not present

## 2019-10-12 DIAGNOSIS — J3089 Other allergic rhinitis: Secondary | ICD-10-CM | POA: Diagnosis not present

## 2019-10-12 DIAGNOSIS — J3081 Allergic rhinitis due to animal (cat) (dog) hair and dander: Secondary | ICD-10-CM | POA: Diagnosis not present

## 2019-10-13 ENCOUNTER — Other Ambulatory Visit: Payer: Self-pay

## 2019-10-13 ENCOUNTER — Telehealth: Payer: Self-pay

## 2019-10-13 ENCOUNTER — Encounter: Payer: Self-pay | Admitting: Obstetrics and Gynecology

## 2019-10-13 ENCOUNTER — Ambulatory Visit (INDEPENDENT_AMBULATORY_CARE_PROVIDER_SITE_OTHER): Payer: BC Managed Care – PPO | Admitting: Obstetrics and Gynecology

## 2019-10-13 VITALS — BP 92/60 | Ht 66.0 in | Wt 125.0 lb

## 2019-10-13 DIAGNOSIS — R102 Pelvic and perineal pain: Secondary | ICD-10-CM | POA: Diagnosis not present

## 2019-10-13 DIAGNOSIS — D2262 Melanocytic nevi of left upper limb, including shoulder: Secondary | ICD-10-CM | POA: Diagnosis not present

## 2019-10-13 DIAGNOSIS — D225 Melanocytic nevi of trunk: Secondary | ICD-10-CM | POA: Diagnosis not present

## 2019-10-13 DIAGNOSIS — N949 Unspecified condition associated with female genital organs and menstrual cycle: Secondary | ICD-10-CM

## 2019-10-13 DIAGNOSIS — L700 Acne vulgaris: Secondary | ICD-10-CM | POA: Diagnosis not present

## 2019-10-13 DIAGNOSIS — D2261 Melanocytic nevi of right upper limb, including shoulder: Secondary | ICD-10-CM | POA: Diagnosis not present

## 2019-10-13 LAB — POCT URINALYSIS DIPSTICK
Bilirubin, UA: NEGATIVE
Glucose, UA: NEGATIVE
Ketones, UA: NEGATIVE
Nitrite, UA: NEGATIVE
Protein, UA: NEGATIVE
Spec Grav, UA: 1.01 (ref 1.010–1.025)
pH, UA: 6.5 (ref 5.0–8.0)

## 2019-10-13 NOTE — Progress Notes (Signed)
Veronica Gibson, Veronica Evener, PA-C   Chief Complaint  Patient presents with  . Vaginal Exam    not sure if she has uti, feels vaginal irritation    HPI:      Veronica Gibson is a 44 y.o. [id number] who LMP was No LMP recorded. (Menstrual status: IUD)., presents today for vaginal discomfort after sex in a different position 2 days ago, although had sex yesterday without any sx. No vag d/c, odor, dysuria. Having urinary frequency, usually with good flow, and pelvic pressure. Noticed blood with wiping this AM. No unusual LBP, fevers. Hx of UTI sx in past that were really bad, but pt doesn't feel that way now. Just not sure if sx vaginal vs bladder. Took ibup today with some improvement.  Hx of UTI sx multiple times last yr with neg C&S.  Patient Active Problem List   Diagnosis Date Noted  . Achilles rupture, right 08/10/2017  . Metatarsalgia of both feet 09/29/2013  . Patellar tendinitis 09/29/2013  . Abnormality of gait 03/24/2013  . 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 TENDINITIS, TIBIALIS 04/03/2007  . KNEE PAIN, RIGHT 03/13/2007    Past Surgical History:  Procedure Laterality Date  . ACHILLES TENDON REPAIR  08/18/2017  . WISDOM TOOTH EXTRACTION      Family History  Problem Relation Age of Onset  . Cancer Paternal Grandmother 76       UTERINE  . Coronary artery disease Paternal Grandfather        CABG  . Cancer Paternal Aunt        uterine  . Cancer Other        uterine    Social History   Socioeconomic History  . Marital status: Divorced    Spouse name: Not on file  . Number of children: 2  . Years of education: 40  . Highest education level: Not on file  Occupational History  . Occupation: Designer, jewellery: [employer]  Tobacco Use  . Smoking status: Never Smoker  . Smokeless tobacco: Never Used  Substance and Sexual Activity  . Alcohol use: Yes    Comment: OCC  . Drug use: No  . Sexual activity: Yes    Birth control/protection: Abstinence,  I.U.D.    Comment: Mirena  Other Topics Concern  . Not on file  Social History Narrative  . Not on file   Social Determinants of Health   Financial Resource Strain:   . Difficulty of Paying Living Expenses: Not on file  Food Insecurity:   . Worried About Charity fundraiser in the Last Year: Not on file  . Ran Out of Food in the Last Year: Not on file  Transportation Needs:   . Lack of Transportation (Medical): Not on file  . Lack of Transportation (Non-Medical): Not on file  Physical Activity:   . Days of Exercise per Week: Not on file  . Minutes of Exercise per Session: Not on file  Stress:   . Feeling of Stress : Not on file  Social Connections:   . Frequency of Communication with Friends and Family: Not on file  . Frequency of Social Gatherings with Friends and Family: Not on file  . Attends Religious Services: Not on file  . Active Member of Clubs or Organizations: Not on file  . Attends Archivist Meetings: Not on file  . Marital Status: Not on file  Intimate Partner Violence:   . Fear of Current or Ex-Partner:  Not on file  . Emotionally Abused: Not on file  . Physically Abused: Not on file  . Sexually Abused: Not on file    Outpatient Medications Prior to Visit  Medication Sig Dispense Refill  . azelastine (ASTELIN) 0.1 % nasal spray SMARTSIG:1-2 Spray(s) Both Nares Twice Daily    . fluticasone (FLONASE) 50 MCG/ACT nasal spray PLACE 1-2 SPRAY IN EACH NOSTRIL ONCE A DAY  3  . loratadine (CLARITIN) 10 MG tablet Take 10 mg by mouth 2 (two) times daily.     . fexofenadine (QC FEXOFENADINE HYDROCHLORIDE) 180 MG tablet     . levonorgestrel (MIRENA, 52 MG,) 20 MCG/24HR IUD 1 Intra Uterine Device (1 each total) by Intrauterine route once for 1 dose. 1 Intra Uterine Device 0   No facility-administered medications prior to visit.      ROS:  Review of Systems  Constitutional: Negative for fever.  Gastrointestinal: Negative for blood in stool, constipation,  diarrhea, nausea and vomiting.  Genitourinary: Positive for frequency and vaginal bleeding. Negative for dyspareunia, dysuria, flank pain, hematuria, urgency, vaginal discharge and vaginal pain.  Musculoskeletal: Negative for back pain.  Skin: Negative for rash.   BREAST: No symptoms   OBJECTIVE:   Vitals:  BP 92/60   Ht 5\' 6"  (1.676 m)   Wt 125 lb (56.7 kg)   BMI 20.18 kg/m   Physical Exam Vitals reviewed.  Constitutional:      Appearance: She is well-developed.  Pulmonary:     Effort: Pulmonary effort is normal.  Genitourinary:    General: Normal vulva.     Pubic Area: No rash.      Labia:        Right: No rash, tenderness or lesion.        Left: No rash, tenderness or lesion.      Vagina: Bleeding present. No vaginal discharge, erythema or tenderness.     Cervix: Normal.     Uterus: Normal. Not enlarged and not tender.      Adnexa: Right adnexa normal and left adnexa normal.       Right: No mass or tenderness.         Left: No mass or tenderness.       Comments: IUD STRINGS IN CX OS Musculoskeletal:        General: Normal range of motion.     Cervical back: Normal range of motion.  Skin:    General: Skin is warm and dry.  Neurological:     General: No focal deficit present.     Mental Status: She is alert and oriented to person, place, and time.  Psychiatric:        Mood and Affect: Mood normal.        Behavior: Behavior normal.        Thought Content: Thought content normal.        Judgment: Judgment normal.     Results: Results for orders placed or performed in visit on 10/13/19 (from the past 24 hour(s))  POCT Urinalysis Dipstick     Status: Abnormal   Collection Time: 10/13/19 11:46 AM  Result Value Ref Range   Color, UA yellow    Clarity, UA clear    Glucose, UA Negative Negative   Bilirubin, UA neg    Ketones, UA neg    Spec Grav, UA 1.010 1.010 - 1.025   Blood, UA mod    pH, UA 6.5 5.0 - 8.0   Protein, UA Negative Negative   Urobilinogen, UA  Nitrite, UA neg    Leukocytes, UA Moderate (2+) (A) Negative   Appearance     Odor     VAG BLEEDING ON EXAM  Assessment/Plan: Pelvic pressure in female - Plan: POCT Urinalysis Dipstick, Urine Culture; Hematuria/WBCs on UA but minimal sx and having menstrual bleeding. Pt doesn't want abx unless needed. Has hx of neg C&S last yr. Check C&S and will f/u if pos for abx Rx. Pt to f/u sooner if sx progress to UTI sx. Increase water.   Vaginal discomfort--neg exam. Most likely due to sex position. F/u prn.      Return if symptoms worsen or fail to improve.  Valery Chance B. Simpson Paulos, PA-C 10/13/2019 11:49 AM

## 2019-10-13 NOTE — Telephone Encounter (Signed)
Pt thinks she has a UTI; can she drop off a specimen?  [phone] Pt states she just feels irritated; can't tell if vaginal or urinary.  Adv to be seen.  Tx'd to [facility] for scheduling.

## 2019-10-13 NOTE — Patient Instructions (Signed)
I value your feedback and entrusting us with your care. If you get a [facility] patient survey, I would appreciate you taking the time to let us know about your experience today. Thank you!  As of September 02, 2019, your lab results will be released to your MyChart immediately, before I even have a chance to see them. Please give me time to review them and contact you if there are any abnormalities. Thank you for your patience.  

## 2019-10-14 ENCOUNTER — Ambulatory Visit: Payer: BC Managed Care – PPO | Admitting: Obstetrics and Gynecology

## 2019-10-15 ENCOUNTER — Telehealth: Payer: Self-pay | Admitting: Obstetrics and Gynecology

## 2019-10-15 LAB — URINE CULTURE

## 2019-10-15 MED ORDER — NITROFURANTOIN MONOHYD MACRO 100 MG PO CAPS: 100.0000 mg | ORAL_CAPSULE | Freq: Two times a day (BID) | ORAL | 0 refills | Status: AC

## 2019-10-15 NOTE — Telephone Encounter (Signed)
LM with prelim C&S result of E. Coli (50,000-100,000), Rx macrobid eRxd. F/u prn.

## 2019-10-21 DIAGNOSIS — J3081 Allergic rhinitis due to animal (cat) (dog) hair and dander: Secondary | ICD-10-CM | POA: Diagnosis not present

## 2019-10-21 DIAGNOSIS — J3089 Other allergic rhinitis: Secondary | ICD-10-CM | POA: Diagnosis not present

## 2019-10-21 DIAGNOSIS — J301 Allergic rhinitis due to pollen: Secondary | ICD-10-CM | POA: Diagnosis not present

## 2019-10-28 DIAGNOSIS — J3081 Allergic rhinitis due to animal (cat) (dog) hair and dander: Secondary | ICD-10-CM | POA: Diagnosis not present

## 2019-10-28 DIAGNOSIS — J301 Allergic rhinitis due to pollen: Secondary | ICD-10-CM | POA: Diagnosis not present

## 2019-10-28 DIAGNOSIS — J3089 Other allergic rhinitis: Secondary | ICD-10-CM | POA: Diagnosis not present

## 2019-10-29 DIAGNOSIS — J301 Allergic rhinitis due to pollen: Secondary | ICD-10-CM | POA: Diagnosis not present

## 2019-10-29 DIAGNOSIS — J3081 Allergic rhinitis due to animal (cat) (dog) hair and dander: Secondary | ICD-10-CM | POA: Diagnosis not present

## 2019-11-01 DIAGNOSIS — J3089 Other allergic rhinitis: Secondary | ICD-10-CM | POA: Diagnosis not present

## 2019-11-04 DIAGNOSIS — J3089 Other allergic rhinitis: Secondary | ICD-10-CM | POA: Diagnosis not present

## 2019-11-04 DIAGNOSIS — F411 Generalized anxiety disorder: Secondary | ICD-10-CM | POA: Diagnosis not present

## 2019-11-04 DIAGNOSIS — J301 Allergic rhinitis due to pollen: Secondary | ICD-10-CM | POA: Diagnosis not present

## 2019-11-04 DIAGNOSIS — J3081 Allergic rhinitis due to animal (cat) (dog) hair and dander: Secondary | ICD-10-CM | POA: Diagnosis not present

## 2019-11-09 ENCOUNTER — Ambulatory Visit (INDEPENDENT_AMBULATORY_CARE_PROVIDER_SITE_OTHER): Payer: BC Managed Care – PPO | Admitting: Obstetrics and Gynecology

## 2019-11-09 ENCOUNTER — Other Ambulatory Visit: Payer: Self-pay

## 2019-11-09 ENCOUNTER — Encounter: Payer: Self-pay | Admitting: Obstetrics and Gynecology

## 2019-11-09 VITALS — BP 90/60 | Ht 66.0 in | Wt 125.0 lb

## 2019-11-09 DIAGNOSIS — N3001 Acute cystitis with hematuria: Secondary | ICD-10-CM

## 2019-11-09 LAB — POCT URINALYSIS DIPSTICK
Bilirubin, UA: NEGATIVE
Glucose, UA: NEGATIVE
Ketones, UA: NEGATIVE
Nitrite, UA: NEGATIVE
Protein, UA: NEGATIVE
Spec Grav, UA: 1.01 (ref 1.010–1.025)
pH, UA: 5 (ref 5.0–8.0)

## 2019-11-09 MED ORDER — NITROFURANTOIN MONOHYD MACRO 100 MG PO CAPS: ORAL_CAPSULE | ORAL | 3 refills | Status: DC

## 2019-11-09 MED ORDER — NITROFURANTOIN MONOHYD MACRO 100 MG PO CAPS: 100.0000 mg | ORAL_CAPSULE | Freq: Two times a day (BID) | ORAL | 0 refills | Status: DC

## 2019-11-09 MED ORDER — NITROFURANTOIN MONOHYD MACRO 100 MG PO CAPS: 100.0000 mg | ORAL_CAPSULE | Freq: Two times a day (BID) | ORAL | 0 refills | Status: AC

## 2019-11-09 NOTE — Patient Instructions (Signed)
I value your feedback and entrusting us with your care. If you get a [facility] patient survey, I would appreciate you taking the time to let us know about your experience today. Thank you!  As of September 02, 2019, your lab results will be released to your MyChart immediately, before I even have a chance to see them. Please give me time to review them and contact you if there are any abnormalities. Thank you for your patience.  

## 2019-11-09 NOTE — Progress Notes (Signed)
Copland, Deirdre Evener, PA-C   Chief Complaint  Patient presents with  . Urinary Tract Infection    frequency and burning, tiny bit of blood when wiped as of this morning    HPI:      Veronica Gibson is a 44 y.o. [id number] who LMP was No LMP recorded. (Menstrual status: IUD)., presents today for urinary frequency and dysuria with hematuria since this AM. No pelvic pressure, LBP (except where pulled back earlier this wk), fevers. Has had several episodes of UTI sx over the past yr, some with neg C&S. Pt is sex active frequently, which is most likely trigger for sx. Doesn't always void after sex. Doesn't drink caffeine. No vag sx today.  Treated for E. Coli UTI 10/14/19 with macrobid with sx relief.    Past Medical History:  Diagnosis Date  . Anxiety   . Head ache   . History of mammogram 12/13/2013   CAT 2  . History of Papanicolaou smear of cervix 04/07/2014   -/-  . Rupture Achilles tendon 08/14/2017   Right  . Seasonal allergies   . Vaccine for human papilloma virus (HPV) types 6, 11, 16, and 18 administered     Past Surgical History:  Procedure Laterality Date  . ACHILLES TENDON REPAIR  08/18/2017  . WISDOM TOOTH EXTRACTION      Family History  Problem Relation Age of Onset  . Cancer Paternal Grandmother 59       UTERINE  . Coronary artery disease Paternal Grandfather        CABG  . Cancer Paternal Aunt        uterine  . Cancer Other        uterine    Social History   Socioeconomic History  . Marital status: Divorced    Spouse name: Not on file  . Number of children: 2  . Years of education: 19  . Highest education level: Not on file  Occupational History  . Occupation: Designer, jewellery: [employer]  Tobacco Use  . Smoking status: Never Smoker  . Smokeless tobacco: Never Used  Substance and Sexual Activity  . Alcohol use: Yes    Comment: OCC  . Drug use: No  . Sexual activity: Yes    Birth control/protection: I.U.D.    Comment: Mirena   Other Topics Concern  . Not on file  Social History Narrative  . Not on file   Social Determinants of Health   Financial Resource Strain:   . Difficulty of Paying Living Expenses: Not on file  Food Insecurity:   . Worried About Charity fundraiser in the Last Year: Not on file  . Ran Out of Food in the Last Year: Not on file  Transportation Needs:   . Lack of Transportation (Medical): Not on file  . Lack of Transportation (Non-Medical): Not on file  Physical Activity:   . Days of Exercise per Week: Not on file  . Minutes of Exercise per Session: Not on file  Stress:   . Feeling of Stress : Not on file  Social Connections:   . Frequency of Communication with Friends and Family: Not on file  . Frequency of Social Gatherings with Friends and Family: Not on file  . Attends Religious Services: Not on file  . Active Member of Clubs or Organizations: Not on file  . Attends Archivist Meetings: Not on file  . Marital Status: Not on file  Intimate Partner  Violence:   . Fear of Current or Ex-Partner: Not on file  . Emotionally Abused: Not on file  . Physically Abused: Not on file  . Sexually Abused: Not on file    Outpatient Medications Prior to Visit  Medication Sig Dispense Refill  . azelastine (ASTELIN) 0.1 % nasal spray SMARTSIG:1-2 Spray(s) Both Nares Twice Daily    . fexofenadine (QC FEXOFENADINE HYDROCHLORIDE) 180 MG tablet     . fluticasone (FLONASE) 50 MCG/ACT nasal spray PLACE 1-2 SPRAY IN EACH NOSTRIL ONCE A DAY  3  . loratadine (CLARITIN) 10 MG tablet Take 10 mg by mouth 2 (two) times daily.     Marland Kitchen levonorgestrel (MIRENA, 52 MG,) 20 MCG/24HR IUD 1 Intra Uterine Device (1 each total) by Intrauterine route once for 1 dose. 1 Intra Uterine Device 0   No facility-administered medications prior to visit.      ROS:  Review of Systems  Constitutional: Negative for fever.  Gastrointestinal: Negative for blood in stool, constipation, diarrhea, nausea and  vomiting.  Genitourinary: Positive for dysuria and frequency. Negative for dyspareunia, flank pain, hematuria, urgency, vaginal bleeding, vaginal discharge and vaginal pain.  Musculoskeletal: Negative for back pain.  Skin: Negative for rash.  BREAST: No symptoms   OBJECTIVE:   Vitals:  BP 90/60   Ht 5\' 6"  (1.676 m)   Wt 125 lb (56.7 kg)   BMI 20.18 kg/m   Physical Exam Vitals reviewed.  Constitutional:      Appearance: She is well-developed. She is not ill-appearing or toxic-appearing.  Pulmonary:     Effort: Pulmonary effort is normal.  Abdominal:     Tenderness: There is no right CVA tenderness or left CVA tenderness.  Musculoskeletal:        General: Normal range of motion.     Cervical back: Normal range of motion.  Neurological:     General: No focal deficit present.     Mental Status: She is alert and oriented to person, place, and time.     Cranial Nerves: No cranial nerve deficit.  Psychiatric:        Behavior: Behavior normal.        Thought Content: Thought content normal.        Judgment: Judgment normal.     Results: Results for orders placed or performed in visit on 11/09/19 (from the past 24 hour(s))  POCT Urinalysis Dipstick     Status: Abnormal   Collection Time: 11/09/19  2:10 PM  Result Value Ref Range   Color, UA yellow    Clarity, UA cloudy    Glucose, UA Negative Negative   Bilirubin, UA neg    Ketones, UA neg    Spec Grav, UA 1.010 1.010 - 1.025   Blood, UA small    pH, UA 5.0 5.0 - 8.0   Protein, UA Negative Negative   Urobilinogen, UA     Nitrite, UA neg    Leukocytes, UA Large (3+) (A) Negative   Appearance     Odor       Assessment/Plan: Acute cystitis with hematuria - Plan: POCT Urinalysis Dipstick, nitrofurantoin, macrocrystal-monohydrate; Pos sx and UA. Rx macrobid eRxd. Check C&S. Void after sex, use macrobid after sex as preventive. F/u prn.   Meds ordered this encounter  Medications  . DISCONTD: nitrofurantoin,  macrocrystal-monohydrate, (MACROBID) 100 MG capsule    Sig: Take 1 capsule (100 mg total) by mouth 2 (two) times daily for 5 days.    Dispense:  10 capsule  Refill:  0    Order Specific Question:   Supervising Provider    Answer:   Gae Dry [id number]  . nitrofurantoin, macrocrystal-monohydrate, (MACROBID) 100 MG capsule    Sig: Take 1 capsule (100 mg total) by mouth 2 (two) times daily for 5 days.    Dispense:  10 capsule    Refill:  0    Order Specific Question:   Supervising Provider    Answer:   Gae Dry [id number]  . nitrofurantoin, macrocrystal-monohydrate, (MACROBID) 100 MG capsule    Sig: Take 1 tab after sex as preventive    Dispense:  30 capsule    Refill:  3    Order Specific Question:   Supervising Provider    Answer:   Gae Dry [id number]      Return if symptoms worsen or fail to improve.  Alicia B. Copland, PA-C 11/09/2019 2:14 PM

## 2019-11-11 LAB — URINE CULTURE

## 2019-11-16 DIAGNOSIS — J3089 Other allergic rhinitis: Secondary | ICD-10-CM | POA: Diagnosis not present

## 2019-11-16 DIAGNOSIS — J3081 Allergic rhinitis due to animal (cat) (dog) hair and dander: Secondary | ICD-10-CM | POA: Diagnosis not present

## 2019-11-16 DIAGNOSIS — J301 Allergic rhinitis due to pollen: Secondary | ICD-10-CM | POA: Diagnosis not present

## 2019-11-19 ENCOUNTER — Ambulatory Visit
Admission: RE | Admit: 2019-11-19 | Discharge: 2019-11-19 | Disposition: A | Discharge status: Home / Self Care | Payer: BC Managed Care – PPO | Source: Ambulatory Visit | Attending: Obstetrics and Gynecology | Admitting: Obstetrics and Gynecology

## 2019-11-19 DIAGNOSIS — Z1231 Encounter for screening mammogram for malignant neoplasm of breast: Secondary | ICD-10-CM

## 2019-11-22 ENCOUNTER — Encounter: Payer: Self-pay | Admitting: Obstetrics and Gynecology

## 2019-11-25 DIAGNOSIS — J3089 Other allergic rhinitis: Secondary | ICD-10-CM | POA: Diagnosis not present

## 2019-11-25 DIAGNOSIS — J3081 Allergic rhinitis due to animal (cat) (dog) hair and dander: Secondary | ICD-10-CM | POA: Diagnosis not present

## 2019-11-25 DIAGNOSIS — J301 Allergic rhinitis due to pollen: Secondary | ICD-10-CM | POA: Diagnosis not present

## 2019-11-30 DIAGNOSIS — J3081 Allergic rhinitis due to animal (cat) (dog) hair and dander: Secondary | ICD-10-CM | POA: Diagnosis not present

## 2019-11-30 DIAGNOSIS — J3089 Other allergic rhinitis: Secondary | ICD-10-CM | POA: Diagnosis not present

## 2019-11-30 DIAGNOSIS — J301 Allergic rhinitis due to pollen: Secondary | ICD-10-CM | POA: Diagnosis not present

## 2019-12-02 DIAGNOSIS — F411 Generalized anxiety disorder: Secondary | ICD-10-CM | POA: Diagnosis not present

## 2019-12-02 DIAGNOSIS — J3089 Other allergic rhinitis: Secondary | ICD-10-CM | POA: Diagnosis not present

## 2019-12-02 DIAGNOSIS — J3081 Allergic rhinitis due to animal (cat) (dog) hair and dander: Secondary | ICD-10-CM | POA: Diagnosis not present

## 2019-12-02 DIAGNOSIS — J301 Allergic rhinitis due to pollen: Secondary | ICD-10-CM | POA: Diagnosis not present

## 2019-12-07 DIAGNOSIS — J3081 Allergic rhinitis due to animal (cat) (dog) hair and dander: Secondary | ICD-10-CM | POA: Diagnosis not present

## 2019-12-07 DIAGNOSIS — J3089 Other allergic rhinitis: Secondary | ICD-10-CM | POA: Diagnosis not present

## 2019-12-07 DIAGNOSIS — J301 Allergic rhinitis due to pollen: Secondary | ICD-10-CM | POA: Diagnosis not present

## 2019-12-09 DIAGNOSIS — J3081 Allergic rhinitis due to animal (cat) (dog) hair and dander: Secondary | ICD-10-CM | POA: Diagnosis not present

## 2019-12-09 DIAGNOSIS — J301 Allergic rhinitis due to pollen: Secondary | ICD-10-CM | POA: Diagnosis not present

## 2019-12-09 DIAGNOSIS — J3089 Other allergic rhinitis: Secondary | ICD-10-CM | POA: Diagnosis not present

## 2019-12-16 DIAGNOSIS — J3081 Allergic rhinitis due to animal (cat) (dog) hair and dander: Secondary | ICD-10-CM | POA: Diagnosis not present

## 2019-12-16 DIAGNOSIS — J3089 Other allergic rhinitis: Secondary | ICD-10-CM | POA: Diagnosis not present

## 2019-12-16 DIAGNOSIS — J301 Allergic rhinitis due to pollen: Secondary | ICD-10-CM | POA: Diagnosis not present

## 2019-12-23 DIAGNOSIS — J3089 Other allergic rhinitis: Secondary | ICD-10-CM | POA: Diagnosis not present

## 2019-12-23 DIAGNOSIS — J301 Allergic rhinitis due to pollen: Secondary | ICD-10-CM | POA: Diagnosis not present

## 2019-12-23 DIAGNOSIS — J3081 Allergic rhinitis due to animal (cat) (dog) hair and dander: Secondary | ICD-10-CM | POA: Diagnosis not present

## 2019-12-28 DIAGNOSIS — J301 Allergic rhinitis due to pollen: Secondary | ICD-10-CM | POA: Diagnosis not present

## 2019-12-28 DIAGNOSIS — J3089 Other allergic rhinitis: Secondary | ICD-10-CM | POA: Diagnosis not present

## 2019-12-28 DIAGNOSIS — J3081 Allergic rhinitis due to animal (cat) (dog) hair and dander: Secondary | ICD-10-CM | POA: Diagnosis not present

## 2020-01-04 DIAGNOSIS — J301 Allergic rhinitis due to pollen: Secondary | ICD-10-CM | POA: Diagnosis not present

## 2020-01-04 DIAGNOSIS — J3089 Other allergic rhinitis: Secondary | ICD-10-CM | POA: Diagnosis not present

## 2020-01-04 DIAGNOSIS — J3081 Allergic rhinitis due to animal (cat) (dog) hair and dander: Secondary | ICD-10-CM | POA: Diagnosis not present

## 2020-01-06 DIAGNOSIS — F411 Generalized anxiety disorder: Secondary | ICD-10-CM | POA: Diagnosis not present

## 2020-01-11 DIAGNOSIS — J3089 Other allergic rhinitis: Secondary | ICD-10-CM | POA: Diagnosis not present

## 2020-01-11 DIAGNOSIS — J3081 Allergic rhinitis due to animal (cat) (dog) hair and dander: Secondary | ICD-10-CM | POA: Diagnosis not present

## 2020-01-11 DIAGNOSIS — J301 Allergic rhinitis due to pollen: Secondary | ICD-10-CM | POA: Diagnosis not present

## 2020-01-13 DIAGNOSIS — N3001 Acute cystitis with hematuria: Secondary | ICD-10-CM | POA: Diagnosis not present

## 2020-01-13 DIAGNOSIS — R300 Dysuria: Secondary | ICD-10-CM | POA: Diagnosis not present

## 2020-01-20 DIAGNOSIS — J3081 Allergic rhinitis due to animal (cat) (dog) hair and dander: Secondary | ICD-10-CM | POA: Diagnosis not present

## 2020-01-20 DIAGNOSIS — J301 Allergic rhinitis due to pollen: Secondary | ICD-10-CM | POA: Diagnosis not present

## 2020-01-20 DIAGNOSIS — J3089 Other allergic rhinitis: Secondary | ICD-10-CM | POA: Diagnosis not present

## 2020-01-25 DIAGNOSIS — J3089 Other allergic rhinitis: Secondary | ICD-10-CM | POA: Diagnosis not present

## 2020-01-25 DIAGNOSIS — J3081 Allergic rhinitis due to animal (cat) (dog) hair and dander: Secondary | ICD-10-CM | POA: Diagnosis not present

## 2020-01-25 DIAGNOSIS — J301 Allergic rhinitis due to pollen: Secondary | ICD-10-CM | POA: Diagnosis not present

## 2020-02-01 DIAGNOSIS — J3081 Allergic rhinitis due to animal (cat) (dog) hair and dander: Secondary | ICD-10-CM | POA: Diagnosis not present

## 2020-02-01 DIAGNOSIS — J3089 Other allergic rhinitis: Secondary | ICD-10-CM | POA: Diagnosis not present

## 2020-02-01 DIAGNOSIS — J301 Allergic rhinitis due to pollen: Secondary | ICD-10-CM | POA: Diagnosis not present

## 2020-02-03 DIAGNOSIS — F411 Generalized anxiety disorder: Secondary | ICD-10-CM | POA: Diagnosis not present

## 2020-02-04 DIAGNOSIS — M25561 Pain in right knee: Secondary | ICD-10-CM | POA: Diagnosis not present

## 2020-02-04 DIAGNOSIS — M6281 Muscle weakness (generalized): Secondary | ICD-10-CM | POA: Diagnosis not present

## 2020-02-04 DIAGNOSIS — M25661 Stiffness of right knee, not elsewhere classified: Secondary | ICD-10-CM | POA: Diagnosis not present

## 2020-02-08 DIAGNOSIS — J3081 Allergic rhinitis due to animal (cat) (dog) hair and dander: Secondary | ICD-10-CM | POA: Diagnosis not present

## 2020-02-08 DIAGNOSIS — J301 Allergic rhinitis due to pollen: Secondary | ICD-10-CM | POA: Diagnosis not present

## 2020-02-08 DIAGNOSIS — M25661 Stiffness of right knee, not elsewhere classified: Secondary | ICD-10-CM | POA: Diagnosis not present

## 2020-02-08 DIAGNOSIS — M6281 Muscle weakness (generalized): Secondary | ICD-10-CM | POA: Diagnosis not present

## 2020-02-08 DIAGNOSIS — M25561 Pain in right knee: Secondary | ICD-10-CM | POA: Diagnosis not present

## 2020-02-08 DIAGNOSIS — J3089 Other allergic rhinitis: Secondary | ICD-10-CM | POA: Diagnosis not present

## 2020-02-09 ENCOUNTER — Ambulatory Visit: Payer: Self-pay | Admitting: Internal Medicine

## 2020-02-11 DIAGNOSIS — M6281 Muscle weakness (generalized): Secondary | ICD-10-CM | POA: Diagnosis not present

## 2020-02-11 DIAGNOSIS — M25561 Pain in right knee: Secondary | ICD-10-CM | POA: Diagnosis not present

## 2020-02-11 DIAGNOSIS — M25661 Stiffness of right knee, not elsewhere classified: Secondary | ICD-10-CM | POA: Diagnosis not present

## 2020-02-15 DIAGNOSIS — M6281 Muscle weakness (generalized): Secondary | ICD-10-CM | POA: Diagnosis not present

## 2020-02-15 DIAGNOSIS — J3089 Other allergic rhinitis: Secondary | ICD-10-CM | POA: Diagnosis not present

## 2020-02-15 DIAGNOSIS — J301 Allergic rhinitis due to pollen: Secondary | ICD-10-CM | POA: Diagnosis not present

## 2020-02-15 DIAGNOSIS — J3081 Allergic rhinitis due to animal (cat) (dog) hair and dander: Secondary | ICD-10-CM | POA: Diagnosis not present

## 2020-02-15 DIAGNOSIS — M25661 Stiffness of right knee, not elsewhere classified: Secondary | ICD-10-CM | POA: Diagnosis not present

## 2020-02-15 DIAGNOSIS — M25561 Pain in right knee: Secondary | ICD-10-CM | POA: Diagnosis not present

## 2020-02-18 DIAGNOSIS — M25661 Stiffness of right knee, not elsewhere classified: Secondary | ICD-10-CM | POA: Diagnosis not present

## 2020-02-18 DIAGNOSIS — M25561 Pain in right knee: Secondary | ICD-10-CM | POA: Diagnosis not present

## 2020-02-18 DIAGNOSIS — M6281 Muscle weakness (generalized): Secondary | ICD-10-CM | POA: Diagnosis not present

## 2020-02-22 DIAGNOSIS — M25661 Stiffness of right knee, not elsewhere classified: Secondary | ICD-10-CM | POA: Diagnosis not present

## 2020-02-22 DIAGNOSIS — M25561 Pain in right knee: Secondary | ICD-10-CM | POA: Diagnosis not present

## 2020-02-22 DIAGNOSIS — J3081 Allergic rhinitis due to animal (cat) (dog) hair and dander: Secondary | ICD-10-CM | POA: Diagnosis not present

## 2020-02-22 DIAGNOSIS — J3089 Other allergic rhinitis: Secondary | ICD-10-CM | POA: Diagnosis not present

## 2020-02-22 DIAGNOSIS — M6281 Muscle weakness (generalized): Secondary | ICD-10-CM | POA: Diagnosis not present

## 2020-02-22 DIAGNOSIS — J301 Allergic rhinitis due to pollen: Secondary | ICD-10-CM | POA: Diagnosis not present

## 2020-02-22 NOTE — Progress Notes (Signed)
Patient ID: Veronica Gibson, female    DOB: [birth year], 44 y.o.   MRN: [id number]  PCP: Chad Cordial, PA-C  No chief complaint on file.   Subjective:   Veronica Gibson is a 44 y.o. female, presents to clinic with CC of the following:  No chief complaint on file.   HPI:  Patient is a 44 y.o. female who presents today new to the practice  The computer system was down during her entire visit, with the below note completed when the computer access was restored  She presents today to establish with a provider, and has no specific complaints. She notes she has not had laboratory tests done in quite some time, used to get through her work, and was okay with having those done today as well.  She is currently seeing PT for management of her right knee discomfort  She was seen by a provider in [facility] for evaluation and felt to be patellofemoral syndrome. She has a history of Achilles tendon rupture and surgery, and still has some weakness in the right lower extremity which she thinks contributed some to the knee pain.  She currently sees an allergist, and notes severe allergies which are mostly environmental.  She gets allergy shots once a week.  She also takes nonsedating antihistamines and nasal sprays to help manage her symptoms.  Has been seen for recurrent UTI by OB/GYN, with nitrofurantoin-100 mg now taken after intercourse, and has been helpful.  He notes that she has not had a UTI in at least 3 to 4 months with this regimen. Has any history of kidney infections.  She also has a history of anxiety, was on medications in the past although not in the more recent past and notes she would prefer to keep it that way.  She does touch base with a nurse practitioner in a psychiatry office once a year still, and also sees counselor every 6 weeks and does think that is helpful.  She denies any recent depression concerns.  Her annual gyn evaluation was in Nov,  2020 Her menses are absent with IUD. Sex activity: single partner, contraception - IUD. Mirena placed 11/25/17.  Last Pap: 05/06/17 Results were: no abnormalities /neg HPV DNA  Hx of STDs: none Last mammo: Feb 2021 -  Results were normal, repeat in 12 months There is no FH of breast cancer. There is no FH of ovarian cancer.   Tobacco use: The patient denies current or previous tobacco use. Alcohol use: social drinker  No drug use. Exercise: moderately active Lives with her children when she has them. She notes she does intermittent fasting presently as well. Patient Active Problem List   Diagnosis Date Noted  . Achilles rupture, right 08/10/2017  . Metatarsalgia of both feet 09/29/2013  . Patellar tendinitis 09/29/2013  . Abnormality of gait 03/24/2013  . 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 TENDINITIS, TIBIALIS 04/03/2007  . KNEE PAIN, RIGHT 03/13/2007      Current Outpatient Medications:  .  azelastine (ASTELIN) 0.1 % nasal spray, SMARTSIG:1-2 Spray(s) Both Nares Twice Daily, Disp: , Rfl:  .  fexofenadine (QC FEXOFENADINE HYDROCHLORIDE) 180 MG tablet, , Disp: , Rfl:  .  fluticasone (FLONASE) 50 MCG/ACT nasal spray, PLACE 1-2 SPRAY IN EACH NOSTRIL ONCE A DAY, Disp: , Rfl: 3 .  levonorgestrel (MIRENA, 52 MG,) 20 MCG/24HR IUD, 1 Intra Uterine Device (1 each total) by Intrauterine route once for 1 dose., Disp: 1 Intra  Uterine Device, Rfl: 0 .  loratadine (CLARITIN) 10 MG tablet, Take 10 mg by mouth 2 (two) times daily. , Disp: , Rfl:  .  nitrofurantoin, macrocrystal-monohydrate, (MACROBID) 100 MG capsule, Take 1 tab after sex as preventive, Disp: 30 capsule, Rfl: 3   Allergies  Allergen Reactions  . Dust Mite Mixed Allergen Ext [Mite (D. Farinae)] Other (See Comments)    And mold  . Other     Other reaction(s): Other (See Comments) Trees, dust  . Pollen Extract     Other reaction(s): Unknown  . Shellfish Allergy      Past Surgical History:  Procedure Laterality  Date  . ACHILLES TENDON REPAIR  08/18/2017  . WISDOM TOOTH EXTRACTION       Family History  Problem Relation Age of Onset  . Cancer Paternal Grandmother 54       UTERINE  . Coronary artery disease Paternal Grandfather        CABG  . Cancer Paternal Aunt        uterine  . Cancer Other        uterine     Social History   Tobacco Use  . Smoking status: Never Smoker  . Smokeless tobacco: Never Used  Substance Use Topics  . Alcohol use: Yes    Comment: OCC    With staff assistance, above reviewed with the patient today.  ROS: As noted above in HPI, also No recent fevers or other Covid concerning sx's, she did have the vaccine x 2 doses No increase headaches, vision changes, wears contacts and sees an eye doctor yearly No CP, palpitations, increased lower extremity swelling No increased SOB,  No persistent abdominal pains  No rectal bleeding or dark/black stools,  No recent flank pains, dysuria, or frequency + For right knee pain which she is currently seeing physical therapy, No numbness, tingling or weakness in the extremities with the exception of her right calf feeling slightly weaker than the left after her Achilles tear and surgery Denies other specific complaints on general systems review except as noted in HPI  No results found for this or any previous visit (from the past 64 hour(s)).   PHQ2/9: No flowsheet data found. PHQ-2/9 Result is   Fall Risk: No flowsheet data found.    Objective:   There were no vitals filed for this visit.  There is no height or weight on file to calculate BMI.  Physical Exam   NAD, masked, pleasant HEENT - [place]/AT, sclera anicteric, positive contacts, PERRL, EOMI, conj - non-inj'ed, TM's and canals clear, pharynx clear Neck - supple, no adenopathy, no TM, carotids 2+ and = without bruits bilat Car - RRR without m/g/r Pulm- RR and effort normal at rest, CTA without wheeze or rales Abd - soft, NT, ND, BS+,  no masses, no  HSM Back - no CVA tenderness Skin- no rash noted on exposed areas Ext - no LE edema, no active joints Neuro/psychiatric - affect was not flat, appropriate with conversation  Alert and oriented  Grossly non-focal - good strength on testing extremities, sensation intact to LT in distal extremities, DTRs 2+ and equal in the patella, Romberg was negative, no pronator drift, good finger-to-nose, good balance on 1 foot without incident  Speech and gait are normal   Results for orders placed or performed in visit on 11/09/19  Urine Culture   Specimen: Urine, Clean Catch   UR  Result Value Ref Range   Urine Culture, Routine Final report (A)  Organism ID, Bacteria Escherichia coli (A)    Antimicrobial Susceptibility Comment   POCT Urinalysis Dipstick  Result Value Ref Range   Color, UA yellow    Clarity, UA cloudy    Glucose, UA Negative Negative   Bilirubin, UA neg    Ketones, UA neg    Spec Grav, UA 1.010 1.010 - 1.025   Blood, UA small    pH, UA 5.0 5.0 - 8.0   Protein, UA Negative Negative   Urobilinogen, UA     Nitrite, UA neg    Leukocytes, UA Large (3+) (A) Negative   Appearance     Odor         Assessment & Plan:    1. Encounter to establish care with new doctor   2. Screening for lipid disorders Agreed to screen with a lipid panel today - Lipid panel  3. Screening for thyroid disorder We will check a TSH for screening purposes - TSH  4. Recurrent UTI She will continue the nitrofurantoin after intercourse as has been helpful in the recent past in preventing further UTIs. - CBC with Differential/Platelet  5. H/O anxiety disorder Continue with counseling, and has a periodic follow-up yearly with psychiatry and will continue. - COMPLETE METABOLIC PANEL WITH GFR - TSH  6.  Right knee pain-patellofemoral syndrome by history Continue with physical therapy To follow-up if symptoms began increasing.  7. Allergy, initial encounter Continue to see the allergist,  and she has weekly injections presently to help. Also continue with the medication regimen as it has been helpful - CBC with Differential/Platelet - COMPLETE METABOLIC PANEL WITH GFR   Follow-up in 1 year, sooner as needed and await lab results presently Also will continue to see her allergist and also gynecology for her annual woman's health assessments    [name], MD 02/23/20 11:58 AM

## 2020-02-23 ENCOUNTER — Other Ambulatory Visit: Payer: Self-pay

## 2020-02-23 ENCOUNTER — Encounter: Payer: Self-pay | Admitting: Internal Medicine

## 2020-02-23 ENCOUNTER — Ambulatory Visit (INDEPENDENT_AMBULATORY_CARE_PROVIDER_SITE_OTHER): Payer: BC Managed Care – PPO | Admitting: Internal Medicine

## 2020-02-23 VITALS — BP 100/60 | HR 78 | Temp 98.3°F | Resp 16 | Ht 66.0 in | Wt 120.2 lb

## 2020-02-23 DIAGNOSIS — M25561 Pain in right knee: Secondary | ICD-10-CM

## 2020-02-23 DIAGNOSIS — Z8659 Personal history of other mental and behavioral disorders: Secondary | ICD-10-CM

## 2020-02-23 DIAGNOSIS — Z7689 Persons encountering health services in other specified circumstances: Secondary | ICD-10-CM | POA: Diagnosis not present

## 2020-02-23 DIAGNOSIS — T7840XA Allergy, unspecified, initial encounter: Secondary | ICD-10-CM | POA: Diagnosis not present

## 2020-02-23 DIAGNOSIS — Z1329 Encounter for screening for other suspected endocrine disorder: Secondary | ICD-10-CM

## 2020-02-23 DIAGNOSIS — N390 Urinary tract infection, site not specified: Secondary | ICD-10-CM | POA: Diagnosis not present

## 2020-02-23 DIAGNOSIS — Z13220 Encounter for screening for lipoid disorders: Secondary | ICD-10-CM

## 2020-02-24 DIAGNOSIS — M25561 Pain in right knee: Secondary | ICD-10-CM | POA: Diagnosis not present

## 2020-02-24 DIAGNOSIS — M6281 Muscle weakness (generalized): Secondary | ICD-10-CM | POA: Diagnosis not present

## 2020-02-24 DIAGNOSIS — M25661 Stiffness of right knee, not elsewhere classified: Secondary | ICD-10-CM | POA: Diagnosis not present

## 2020-02-24 LAB — CBC WITH DIFFERENTIAL/PLATELET
Absolute Monocytes: 252 cells/uL (ref 200–950)
Basophils Absolute: 21 cells/uL (ref 0–200)
Basophils Relative: 0.7 %
Eosinophils Absolute: 69 cells/uL (ref 15–500)
Eosinophils Relative: 2.3 %
HCT: 39.6 % (ref 35.0–45.0)
Hemoglobin: 13 g/dL (ref 11.7–15.5)
Lymphs Abs: 1173 cells/uL (ref 850–3900)
MCH: 31 pg (ref 27.0–33.0)
MCHC: 32.8 g/dL (ref 32.0–36.0)
MCV: 94.5 fL (ref 80.0–100.0)
MPV: 10.7 fL (ref 7.5–12.5)
Monocytes Relative: 8.4 %
Neutro Abs: 1485 cells/uL — ABNORMAL LOW (ref 1500–7800)
Neutrophils Relative %: 49.5 %
Platelets: 195 10*3/uL (ref 140–400)
RBC: 4.19 10*6/uL (ref 3.80–5.10)
RDW: 12.1 % (ref 11.0–15.0)
Total Lymphocyte: 39.1 %
WBC: 3 10*3/uL — ABNORMAL LOW (ref 3.8–10.8)

## 2020-02-24 LAB — COMPLETE METABOLIC PANEL WITH GFR
AG Ratio: 2.3 (calc) (ref 1.0–2.5)
ALT: 10 U/L (ref 6–29)
AST: 16 U/L (ref 10–30)
Albumin: 4.4 g/dL (ref 3.6–5.1)
Alkaline phosphatase (APISO): 38 U/L (ref 31–125)
BUN: 10 mg/dL (ref 7–25)
CO2: 28 mmol/L (ref 20–32)
Calcium: 9.3 mg/dL (ref 8.6–10.2)
Chloride: 107 mmol/L (ref 98–110)
Creat: 0.59 mg/dL (ref 0.50–1.10)
GFR, Est African American: 130 mL/min/{1.73_m2} (ref >=60)
GFR, Est Non African American: 112 mL/min/{1.73_m2} (ref >=60)
Globulin: 1.9 g/dL (calc) (ref 1.9–3.7)
Glucose, Bld: 83 mg/dL (ref 65–99)
Potassium: 3.9 mmol/L (ref 3.5–5.3)
Sodium: 140 mmol/L (ref 135–146)
Total Bilirubin: 1.7 mg/dL — ABNORMAL HIGH (ref 0.2–1.2)
Total Protein: 6.3 g/dL (ref 6.1–8.1)

## 2020-02-24 LAB — LIPID PANEL
Cholesterol: 142 mg/dL (ref <200)
HDL: 53 mg/dL (ref >=50)
LDL Cholesterol (Calc): 80 mg/dL (calc)
Non-HDL Cholesterol (Calc): 89 mg/dL (calc) (ref <130)
Total CHOL/HDL Ratio: 2.7 (calc) (ref <5.0)
Triglycerides: 32 mg/dL (ref <150)

## 2020-02-24 LAB — TSH: TSH: 1.07 mIU/L

## 2020-02-28 ENCOUNTER — Telehealth: Payer: Self-pay

## 2020-02-28 DIAGNOSIS — M25561 Pain in right knee: Secondary | ICD-10-CM | POA: Diagnosis not present

## 2020-02-28 DIAGNOSIS — M6281 Muscle weakness (generalized): Secondary | ICD-10-CM | POA: Diagnosis not present

## 2020-02-28 DIAGNOSIS — M25661 Stiffness of right knee, not elsewhere classified: Secondary | ICD-10-CM | POA: Diagnosis not present

## 2020-02-28 NOTE — Telephone Encounter (Signed)
Copied from [place] [phone]. Topic: General - Other >> Feb 28, 2020 11:10 AM Antonieta Iba C wrote: Reason for CRM: pt says that she was told to repeat labs in 6 months, Made pt aware that an apt isn't needed, pt would like to have orders placed.

## 2020-02-29 DIAGNOSIS — J3089 Other allergic rhinitis: Secondary | ICD-10-CM | POA: Diagnosis not present

## 2020-02-29 DIAGNOSIS — J3081 Allergic rhinitis due to animal (cat) (dog) hair and dander: Secondary | ICD-10-CM | POA: Diagnosis not present

## 2020-02-29 DIAGNOSIS — J301 Allergic rhinitis due to pollen: Secondary | ICD-10-CM | POA: Diagnosis not present

## 2020-03-09 DIAGNOSIS — F4323 Adjustment disorder with mixed anxiety and depressed mood: Secondary | ICD-10-CM | POA: Diagnosis not present

## 2020-03-09 DIAGNOSIS — J3081 Allergic rhinitis due to animal (cat) (dog) hair and dander: Secondary | ICD-10-CM | POA: Diagnosis not present

## 2020-03-09 DIAGNOSIS — J301 Allergic rhinitis due to pollen: Secondary | ICD-10-CM | POA: Diagnosis not present

## 2020-03-09 DIAGNOSIS — F411 Generalized anxiety disorder: Secondary | ICD-10-CM | POA: Diagnosis not present

## 2020-03-09 DIAGNOSIS — J3089 Other allergic rhinitis: Secondary | ICD-10-CM | POA: Diagnosis not present

## 2020-03-10 DIAGNOSIS — M25661 Stiffness of right knee, not elsewhere classified: Secondary | ICD-10-CM | POA: Diagnosis not present

## 2020-03-10 DIAGNOSIS — M6281 Muscle weakness (generalized): Secondary | ICD-10-CM | POA: Diagnosis not present

## 2020-03-10 DIAGNOSIS — M25561 Pain in right knee: Secondary | ICD-10-CM | POA: Diagnosis not present

## 2020-03-16 DIAGNOSIS — J301 Allergic rhinitis due to pollen: Secondary | ICD-10-CM | POA: Diagnosis not present

## 2020-03-16 DIAGNOSIS — J3081 Allergic rhinitis due to animal (cat) (dog) hair and dander: Secondary | ICD-10-CM | POA: Diagnosis not present

## 2020-03-16 DIAGNOSIS — J3089 Other allergic rhinitis: Secondary | ICD-10-CM | POA: Diagnosis not present

## 2020-03-17 DIAGNOSIS — F411 Generalized anxiety disorder: Secondary | ICD-10-CM | POA: Diagnosis not present

## 2020-03-17 DIAGNOSIS — M6281 Muscle weakness (generalized): Secondary | ICD-10-CM | POA: Diagnosis not present

## 2020-03-17 DIAGNOSIS — M25661 Stiffness of right knee, not elsewhere classified: Secondary | ICD-10-CM | POA: Diagnosis not present

## 2020-03-17 DIAGNOSIS — M25561 Pain in right knee: Secondary | ICD-10-CM | POA: Diagnosis not present

## 2020-03-21 DIAGNOSIS — J3089 Other allergic rhinitis: Secondary | ICD-10-CM | POA: Diagnosis not present

## 2020-03-21 DIAGNOSIS — J3081 Allergic rhinitis due to animal (cat) (dog) hair and dander: Secondary | ICD-10-CM | POA: Diagnosis not present

## 2020-03-21 DIAGNOSIS — J301 Allergic rhinitis due to pollen: Secondary | ICD-10-CM | POA: Diagnosis not present

## 2020-03-22 DIAGNOSIS — M25561 Pain in right knee: Secondary | ICD-10-CM | POA: Diagnosis not present

## 2020-03-22 DIAGNOSIS — M25661 Stiffness of right knee, not elsewhere classified: Secondary | ICD-10-CM | POA: Diagnosis not present

## 2020-03-22 DIAGNOSIS — M6281 Muscle weakness (generalized): Secondary | ICD-10-CM | POA: Diagnosis not present

## 2020-03-23 DIAGNOSIS — A6000 Herpesviral infection of urogenital system, unspecified: Secondary | ICD-10-CM

## 2020-03-23 HISTORY — DX: Herpesviral infection of urogenital system, unspecified: A60.00

## 2020-03-27 DIAGNOSIS — A6003 Herpesviral cervicitis: Secondary | ICD-10-CM | POA: Diagnosis not present

## 2020-03-27 DIAGNOSIS — N898 Other specified noninflammatory disorders of vagina: Secondary | ICD-10-CM | POA: Diagnosis not present

## 2020-03-29 NOTE — Progress Notes (Addendum)
Veronica Malkin, MD   Chief Complaint  Patient presents with  . Vaginal Rash    noticed Saturday, no itching, pain during intercourse    HPI:      Veronica Gibson is a 44 y.o. [id number] whose LMP was No LMP recorded. (Menstrual status: IUD)., presents today for f/u on vaginal lesion. Developed fevers, myalgias, headache and painful vaginal sores almost a wk ago. Went to urgent care and was diagnosed with presumptive herpes and given valtrex. HSV 2 IgG done (and was neg per pt) but no culture done at that time. Pt also screened for gon/chlam (neg) and treated for PID with rocephin and doxy. Pt states lesions were uncomfortable and denies any hx of HSV in past. Has partner for past yr who has hx of oral HSV.  Pt denies any increased vag d/c or odor.    Past Medical History:  Diagnosis Date  . Anxiety   . Head ache   . History of mammogram 12/13/2013   CAT 2  . History of Papanicolaou smear of cervix 04/07/2014   -/-  . Rupture Achilles tendon 08/14/2017   Right  . Seasonal allergies   . Vaccine for human papilloma virus (HPV) types 6, 11, 16, and 18 administered     Past Surgical History:  Procedure Laterality Date  . ACHILLES TENDON REPAIR  08/18/2017  . WISDOM TOOTH EXTRACTION      Family History  Problem Relation Age of Onset  . Cancer Paternal Grandmother 54       UTERINE  . Coronary artery disease Paternal Grandfather        CABG  . Cancer Paternal Aunt        uterine  . Cancer Other        uterine    Social History   Socioeconomic History  . Marital status: Divorced    Spouse name: Not on file  . Number of children: 2  . Years of education: 30  . Highest education level: Not on file  Occupational History  . Occupation: Designer, jewellery: [employer]  Tobacco Use  . Smoking status: Never Smoker  . Smokeless tobacco: Never Used  Vaping Use  . Vaping Use: Never used  Substance and Sexual Activity  . Alcohol use: Yes     Comment: OCC  . Drug use: No  . Sexual activity: Yes    Birth control/protection: I.U.D.    Comment: Mirena  Other Topics Concern  . Not on file  Social History Narrative  . Not on file   Social Determinants of Health   Financial Resource Strain:   . Difficulty of Paying Living Expenses:   Food Insecurity:   . Worried About Charity fundraiser in the Last Year:   . Arboriculturist in the Last Year:   Transportation Needs:   . Film/video editor (Medical):   Marland Kitchen Lack of Transportation (Non-Medical):   Physical Activity:   . Days of Exercise per Week:   . Minutes of Exercise per Session:   Stress:   . Feeling of Stress :   Social Connections:   . Frequency of Communication with Friends and Family:   . Frequency of Social Gatherings with Friends and Family:   . Attends Religious Services:   . Active Member of Clubs or Organizations:   . Attends Archivist Meetings:   Marland Kitchen Marital Status:   Intimate Partner Violence:   . Fear of  Current or Ex-Partner:   . Emotionally Abused:   Marland Kitchen Physically Abused:   . Sexually Abused:     Outpatient Medications Prior to Visit  Medication Sig Dispense Refill  . azelastine (ASTELIN) 0.1 % nasal spray SMARTSIG:1-2 Spray(s) Both Nares Twice Daily    . doxycycline (VIBRAMYCIN) 100 MG capsule Take 100 mg by mouth 2 (two) times daily.    . fexofenadine (QC FEXOFENADINE HYDROCHLORIDE) 180 MG tablet     . fluticasone (FLONASE) 50 MCG/ACT nasal spray PLACE 1-2 SPRAY IN EACH NOSTRIL ONCE A DAY  3  . loratadine (CLARITIN) 10 MG tablet Take 10 mg by mouth 2 (two) times daily.     . nitrofurantoin, macrocrystal-monohydrate, (MACROBID) 100 MG capsule Take 1 tab after sex as preventive 30 capsule 3  . valACYclovir (VALTREX) 1000 MG tablet Take 1,000 mg by mouth 2 (two) times daily.    Marland Kitchen levonorgestrel (MIRENA, 52 MG,) 20 MCG/24HR IUD 1 Intra Uterine Device (1 each total) by Intrauterine route once for 1 dose. 1 Intra Uterine Device 0   No  facility-administered medications prior to visit.      ROS:  Review of Systems  Constitutional: Positive for fever.  Gastrointestinal: Negative for blood in stool, constipation, diarrhea, nausea and vomiting.  Genitourinary: Positive for dyspareunia, genital sores and vaginal discharge. Negative for dysuria, flank pain, frequency, hematuria, urgency, vaginal bleeding and vaginal pain.  Musculoskeletal: Negative for back pain.  Skin: Negative for rash.    OBJECTIVE:   Vitals:  BP 100/60   Ht 5\' 6"  (1.676 m)   Wt 121 lb (54.9 kg)   BMI 19.53 kg/m   Physical Exam Vitals reviewed.  Constitutional:      Appearance: She is well-developed.  Pulmonary:     Effort: Pulmonary effort is normal.  Genitourinary:    General: Normal vulva.     Pubic Area: No rash.      Labia:        Right: Lesion present. No rash or tenderness.        Left: Lesion present. No rash or tenderness.      Vagina: Normal. No vaginal discharge, erythema or tenderness.     Cervix: Normal.     Uterus: Normal. Not enlarged and not tender.      Adnexa: Right adnexa normal and left adnexa normal.       Right: No mass or tenderness.         Left: No mass or tenderness.      Musculoskeletal:        General: Normal range of motion.     Cervical back: Normal range of motion.  Lymphadenopathy:     Lower Body: Right inguinal adenopathy present. Left inguinal adenopathy present.  Skin:    General: Skin is warm and dry.  Neurological:     General: No focal deficit present.     Mental Status: She is alert and oriented to person, place, and time.  Psychiatric:        Mood and Affect: Mood normal.        Behavior: Behavior normal.        Thought Content: Thought content normal.        Judgment: Judgment normal.    Assessment/Plan: Vaginal lesion - Plan: HSV 2 antibody, IgG; Pos sx, neg HSV 2 IgG at Urgent care. HSV by clinical exam today. Nothing to culture today and pt on valtrex already. Rechk HSV 2 IgG in  2 wks. If neg, most likely HSV  1 due to sx and hx. Complete valtrex. Can stop doxy.     Return in about 2 weeks (around 04/13/2020) for lab appt.  Pearlee Arvizu B. Rohin Krejci, PA-C 03/30/2020 4:58 PM

## 2020-03-30 ENCOUNTER — Other Ambulatory Visit: Payer: Self-pay

## 2020-03-30 ENCOUNTER — Encounter: Payer: Self-pay | Admitting: Obstetrics and Gynecology

## 2020-03-30 ENCOUNTER — Ambulatory Visit (INDEPENDENT_AMBULATORY_CARE_PROVIDER_SITE_OTHER): Payer: BC Managed Care – PPO | Admitting: Obstetrics and Gynecology

## 2020-03-30 ENCOUNTER — Ambulatory Visit: Payer: BC Managed Care – PPO | Admitting: Family Medicine

## 2020-03-30 VITALS — BP 100/60 | Ht 66.0 in | Wt 121.0 lb

## 2020-03-30 DIAGNOSIS — N898 Other specified noninflammatory disorders of vagina: Secondary | ICD-10-CM | POA: Diagnosis not present

## 2020-03-30 NOTE — Patient Instructions (Signed)
I value your feedback and entrusting us with your care. If you get a [facility] patient survey, I would appreciate you taking the time to let us know about your experience today. Thank you!  As of September 02, 2019, your lab results will be released to your MyChart immediately, before I even have a chance to see them. Please give me time to review them and contact you if there are any abnormalities. Thank you for your patience.  

## 2020-04-06 DIAGNOSIS — J3081 Allergic rhinitis due to animal (cat) (dog) hair and dander: Secondary | ICD-10-CM | POA: Diagnosis not present

## 2020-04-06 DIAGNOSIS — J3089 Other allergic rhinitis: Secondary | ICD-10-CM | POA: Diagnosis not present

## 2020-04-06 DIAGNOSIS — J301 Allergic rhinitis due to pollen: Secondary | ICD-10-CM | POA: Diagnosis not present

## 2020-04-11 DIAGNOSIS — J3081 Allergic rhinitis due to animal (cat) (dog) hair and dander: Secondary | ICD-10-CM | POA: Diagnosis not present

## 2020-04-11 DIAGNOSIS — J301 Allergic rhinitis due to pollen: Secondary | ICD-10-CM | POA: Diagnosis not present

## 2020-04-11 DIAGNOSIS — J3089 Other allergic rhinitis: Secondary | ICD-10-CM | POA: Diagnosis not present

## 2020-04-13 ENCOUNTER — Other Ambulatory Visit: Payer: Self-pay

## 2020-04-13 ENCOUNTER — Other Ambulatory Visit: Payer: BC Managed Care – PPO

## 2020-04-13 DIAGNOSIS — N898 Other specified noninflammatory disorders of vagina: Secondary | ICD-10-CM

## 2020-04-13 DIAGNOSIS — A6000 Herpesviral infection of urogenital system, unspecified: Secondary | ICD-10-CM | POA: Diagnosis not present

## 2020-04-14 ENCOUNTER — Encounter: Payer: Self-pay | Admitting: Obstetrics and Gynecology

## 2020-04-14 LAB — HSV 2 ANTIBODY, IGG: HSV 2 IgG, Type Spec: 8.21 index — ABNORMAL HIGH (ref 0.00–0.90)

## 2020-04-17 DIAGNOSIS — J3081 Allergic rhinitis due to animal (cat) (dog) hair and dander: Secondary | ICD-10-CM | POA: Diagnosis not present

## 2020-04-17 DIAGNOSIS — J301 Allergic rhinitis due to pollen: Secondary | ICD-10-CM | POA: Diagnosis not present

## 2020-04-17 MED ORDER — VALACYCLOVIR HCL 500 MG PO TABS: 500.0000 mg | ORAL_TABLET | Freq: Two times a day (BID) | ORAL | 1 refills | Status: DC

## 2020-04-18 DIAGNOSIS — J3089 Other allergic rhinitis: Secondary | ICD-10-CM | POA: Diagnosis not present

## 2020-04-27 DIAGNOSIS — J3081 Allergic rhinitis due to animal (cat) (dog) hair and dander: Secondary | ICD-10-CM | POA: Diagnosis not present

## 2020-04-27 DIAGNOSIS — F411 Generalized anxiety disorder: Secondary | ICD-10-CM | POA: Diagnosis not present

## 2020-04-27 DIAGNOSIS — J3089 Other allergic rhinitis: Secondary | ICD-10-CM | POA: Diagnosis not present

## 2020-04-27 DIAGNOSIS — J301 Allergic rhinitis due to pollen: Secondary | ICD-10-CM | POA: Diagnosis not present

## 2020-05-01 ENCOUNTER — Other Ambulatory Visit: Payer: Self-pay | Admitting: Obstetrics and Gynecology

## 2020-05-02 DIAGNOSIS — J3081 Allergic rhinitis due to animal (cat) (dog) hair and dander: Secondary | ICD-10-CM | POA: Diagnosis not present

## 2020-05-02 DIAGNOSIS — J301 Allergic rhinitis due to pollen: Secondary | ICD-10-CM | POA: Diagnosis not present

## 2020-05-02 DIAGNOSIS — J3089 Other allergic rhinitis: Secondary | ICD-10-CM | POA: Diagnosis not present

## 2020-05-05 DIAGNOSIS — J301 Allergic rhinitis due to pollen: Secondary | ICD-10-CM | POA: Diagnosis not present

## 2020-05-05 DIAGNOSIS — J3089 Other allergic rhinitis: Secondary | ICD-10-CM | POA: Diagnosis not present

## 2020-05-05 DIAGNOSIS — J3081 Allergic rhinitis due to animal (cat) (dog) hair and dander: Secondary | ICD-10-CM | POA: Diagnosis not present

## 2020-05-09 DIAGNOSIS — J3081 Allergic rhinitis due to animal (cat) (dog) hair and dander: Secondary | ICD-10-CM | POA: Diagnosis not present

## 2020-05-09 DIAGNOSIS — J3089 Other allergic rhinitis: Secondary | ICD-10-CM | POA: Diagnosis not present

## 2020-05-09 DIAGNOSIS — J301 Allergic rhinitis due to pollen: Secondary | ICD-10-CM | POA: Diagnosis not present

## 2020-05-11 DIAGNOSIS — J3089 Other allergic rhinitis: Secondary | ICD-10-CM | POA: Diagnosis not present

## 2020-05-11 DIAGNOSIS — J301 Allergic rhinitis due to pollen: Secondary | ICD-10-CM | POA: Diagnosis not present

## 2020-05-11 DIAGNOSIS — J3081 Allergic rhinitis due to animal (cat) (dog) hair and dander: Secondary | ICD-10-CM | POA: Diagnosis not present

## 2020-05-15 ENCOUNTER — Encounter: Payer: Self-pay | Admitting: Obstetrics and Gynecology

## 2020-05-16 DIAGNOSIS — J3089 Other allergic rhinitis: Secondary | ICD-10-CM | POA: Diagnosis not present

## 2020-05-16 DIAGNOSIS — J301 Allergic rhinitis due to pollen: Secondary | ICD-10-CM | POA: Diagnosis not present

## 2020-05-16 DIAGNOSIS — J3081 Allergic rhinitis due to animal (cat) (dog) hair and dander: Secondary | ICD-10-CM | POA: Diagnosis not present

## 2020-05-18 ENCOUNTER — Ambulatory Visit: Payer: BC Managed Care – PPO | Admitting: Obstetrics & Gynecology

## 2020-05-18 ENCOUNTER — Encounter: Payer: Self-pay | Admitting: Obstetrics & Gynecology

## 2020-05-18 ENCOUNTER — Other Ambulatory Visit: Payer: Self-pay

## 2020-05-18 ENCOUNTER — Other Ambulatory Visit (HOSPITAL_COMMUNITY)
Admission: RE | Admit: 2020-05-18 | Discharge: 2020-05-18 | Disposition: A | Discharge status: Home / Self Care | Payer: BC Managed Care – PPO | Source: Ambulatory Visit | Attending: Obstetrics & Gynecology | Admitting: Obstetrics & Gynecology

## 2020-05-18 VITALS — BP 120/80 | Ht 66.0 in | Wt 122.0 lb

## 2020-05-18 DIAGNOSIS — B3731 Acute candidiasis of vulva and vagina: Secondary | ICD-10-CM

## 2020-05-18 DIAGNOSIS — Z124 Encounter for screening for malignant neoplasm of cervix: Secondary | ICD-10-CM | POA: Insufficient documentation

## 2020-05-18 DIAGNOSIS — B373 Candidiasis of vulva and vagina: Secondary | ICD-10-CM

## 2020-05-18 MED ORDER — FLUCONAZOLE 150 MG PO TABS: 150.0000 mg | ORAL_TABLET | Freq: Once | ORAL | 3 refills | Status: AC

## 2020-05-18 NOTE — Patient Instructions (Signed)
Thank you for choosing [facility]. As part of our ongoing efforts to improve patient experience, we would appreciate your feedback. Please fill out the short survey that you will receive by mail or MyChart. Your opinion is important to [place]! -Dr Kenton Kingfisher  Fluconazole tablets What is this medicine? FLUCONAZOLE (floo KON na zole) is an antifungal medicine. It is used to treat certain kinds of fungal or yeast infections. This medicine may be used for other purposes; ask your health care provider or pharmacist if you have questions. COMMON BRAND NAME(S): Diflucan What should I tell my health care provider before I take this medicine? They need to know if you have any of these conditions:  history of irregular heart beat  kidney disease  an unusual or allergic reaction to fluconazole, other azole antifungals, medicines, foods, dyes, or preservatives  pregnant or trying to get pregnant  breast-feeding How should I use this medicine? Take this medicine by mouth. Follow the directions on the prescription label. Do not take your medicine more often than directed. Talk to your pediatrician regarding the use of this medicine in children. Special care may be needed. This medicine has been used in children as young as 34 months of age. Overdosage: If you think you have taken too much of this medicine contact a poison control center or emergency room at once. NOTE: This medicine is only for you. Do not share this medicine with others. What if I miss a dose? If you miss a dose, take it as soon as you can. If it is almost time for your next dose, take only that dose. Do not take double or extra doses. What may interact with this medicine? Do not take this medicine with any of the following medications:  astemizole  certain medicines for irregular heart beat like dronedarone, quinidine  cisapride  erythromycin  lomitapide  other medicines that prolong the QT interval (cause an abnormal heart  rhythm)  pimozide  terfenadine  thioridazine This medicine may also interact with the following medications:  antiviral medicines for HIV or AIDS  birth control pills  certain antibiotics like rifabutin, rifampin  certain medicines for blood pressure like amlodipine, isradipine, felodipine, hydrochlorothiazide, losartan, nifedipine  certain medicines for cancer like cyclophosphamide, ibrutinib, vinblastine, vincristine  certain medicines for cholesterol like atorvastatin, lovastatin, fluvastatin, simvastatin  certain medicines for depression, anxiety, or psychotic disturbances like amitriptyline, midazolam, nortriptyline, triazolam  certain medicines for diabetes like glipizide, glyburide, tolbutamide  certain medicines for pain like alfentanil, fentanyl, methadone  certain medicines for seizures like carbamazepine, phenytoin  certain medicines that treat or prevent blood clots like warfarin  dofetilide  halofantrine  medicines that lower your chance of fighting infection like cyclosporine, prednisone, tacrolimus  NSAIDS, medicines for pain and inflammation, like celecoxib, diclofenac, flurbiprofen, ibuprofen, meloxicam, naproxen  other medicines for fungal infections  sirolimus  theophylline  tofacitinib  tolvaptan  ziprasidone This list may not describe all possible interactions. Give your health care provider a list of all the medicines, herbs, non-prescription drugs, or dietary supplements you use. Also tell them if you smoke, drink alcohol, or use illegal drugs. Some items may interact with your medicine. What should I watch for while using this medicine? Visit your doctor or health care professional for regular checkups. If you are taking this medicine for a long time you may need blood work. Tell your doctor if your symptoms do not improve. Some fungal infections need many weeks or months of treatment to cure. Alcohol can increase  possible damage to your  liver. Avoid alcoholic drinks. If you have a vaginal infection, do not have sex until you have finished your treatment. You can wear a sanitary napkin. Do not use tampons. Wear freshly washed cotton, not synthetic, panties. What side effects may I notice from receiving this medicine? Side effects that you should report to your doctor or health care professional as soon as possible:  allergic reactions like skin rash or itching, hives, swelling of the lips, mouth, tongue, or throat  dark urine  feeling dizzy or faint  irregular heartbeat or chest pain  redness, blistering, peeling or loosening of the skin, including inside the mouth  trouble breathing  unusual bruising or bleeding  vomiting  yellowing of the eyes or skin Side effects that usually do not require medical attention (report to your doctor or health care professional if they continue or are bothersome):  changes in how food tastes  diarrhea  headache  stomach upset or nausea This list may not describe all possible side effects. Call your doctor for medical advice about side effects. You may report side effects to FDA at 1-800-FDA-1088. Where should I keep my medicine? Keep out of the reach of children. Store at room temperature below 30 degrees C (86 degrees F). Throw away any medicine after the expiration date. NOTE: This sheet is a summary. It may not cover all possible information. If you have questions about this medicine, talk to your doctor, pharmacist, or health care provider.  2020 Elsevier/Gold Standard (2019-06-03 11:41:56)

## 2020-05-18 NOTE — Progress Notes (Signed)
HPI:      [name] is a 44 y.o. [phone] who LMP was No LMP recorded. (Menstrual status: IUD)., presents today for a problem visit.  She complains of:  Vaginitis: Patient complains of an abnormal vaginal discharge for 2 days. Vaginal symptoms include internal itching and burning.Vulvar symptoms include none.STI Risk: Very low risk of STD exposure, (prior HSV diagnosed, she is monogamous w husband, also takes post coital Macrobid to prevent UTI).  Discharge described as: white and internal (none on pad or clothes).Other associated symptoms: none.  SHe takes Valtrex daily for suppression.  No new travel, infection, trauma.  Increased stress somewhat as school (works at [employer]) back in session.  PMHx: She  has a past medical history of Anxiety, Genital herpes (03/2020), Head ache, History of mammogram (12/13/2013), History of Papanicolaou smear of cervix (04/07/2014), Rupture Achilles tendon (08/14/2017), Seasonal allergies, and Vaccine for human papilloma virus (HPV) types 6, 11, 16, and 18 administered. Also,  has a past surgical history that includes Wisdom tooth extraction and Achilles tendon repair (08/18/2017)., family history includes Cancer in her paternal aunt and another family member; Cancer (age of onset: 9) in her paternal grandmother; Coronary artery disease in her paternal grandfather.,  reports that she has never smoked. She has never used smokeless tobacco. She reports current alcohol use. She reports that she does not use drugs.  She has a current medication list which includes the following prescription(s): azelastine, doxycycline, fexofenadine, fluticasone, loratadine, nitrofurantoin (macrocrystal-monohydrate), fluconazole, and levonorgestrel. Also, is allergic to dust mite mixed allergen ext [mite (d. farinae)], other, pollen extract, and shellfish allergy.  Review of Systems  All other systems reviewed and are negative.   Objective: BP 120/80   Ht 5\' 6"  (1.676 m)    Wt 122 lb (55.3 kg)   BMI 19.69 kg/m  Physical Exam Constitutional:      General: She is not in acute distress.    Appearance: She is well-developed.  Genitourinary:     Pelvic exam was performed with patient supine.     Vagina and uterus normal.     No vaginal erythema or bleeding.     No cervical motion tenderness, discharge, polyp or nabothian cyst.     Uterus is mobile.     Uterus is not enlarged.     No uterine mass detected.    Uterus is midaxial.     No right or left adnexal mass present.     Right adnexa not tender.     Left adnexa not tender.  HENT:     Head: Normocephalic and atraumatic.     Nose: Nose normal.  Abdominal:     General: There is no distension.     Palpations: Abdomen is soft.     Tenderness: There is no abdominal tenderness.  Musculoskeletal:        General: Normal range of motion.  Neurological:     Mental Status: She is alert and oriented to person, place, and time.     Cranial Nerves: No cranial nerve deficit.  Skin:    General: Skin is warm and dry.  Psychiatric:        Attention and Perception: Attention normal.        Mood and Affect: Mood and affect normal.        Speech: Speech normal.        Behavior: Behavior normal.        Thought Content: Thought content normal.  Judgment: Judgment normal.    Microscopic wet-mount exam shows hyphae, monilia.  ASSESSMENT/PLAN:    Problem List Items Addressed This Visit       Screening for cervical cancer   As it is due   Relevant Orders   Cytology - PAP   Candidal vaginitis       Relevant Medications   fluconazole (DIFLUCAN) 150 MG tablet Pros and cons discussed, info provided   Other Relevant Orders   Cervicovaginal ancillary only    Assess w culture too, rule out BV or other etiologies Cont Macrobid and Valtrex for suppression as she has been doing; discussed pros and cons  A total of 20 minutes were spent face-to-face with the patient as well as preparation, review,  communication, and documentation during this encounter.   Barnett Applebaum, MD, [facility], [facility] 05/18/2020  8:49 AM

## 2020-05-22 LAB — CERVICOVAGINAL ANCILLARY ONLY
Bacterial Vaginitis (gardnerella): NEGATIVE
Candida Glabrata: NEGATIVE
Candida Vaginitis: POSITIVE — AB
Chlamydia: NEGATIVE
Comment: NEGATIVE
Comment: NEGATIVE
Comment: NEGATIVE
Comment: NEGATIVE
Comment: NEGATIVE
Comment: NORMAL
Neisseria Gonorrhea: NEGATIVE
Trichomonas: NEGATIVE

## 2020-05-23 DIAGNOSIS — J301 Allergic rhinitis due to pollen: Secondary | ICD-10-CM | POA: Diagnosis not present

## 2020-05-23 DIAGNOSIS — J3089 Other allergic rhinitis: Secondary | ICD-10-CM | POA: Diagnosis not present

## 2020-05-23 DIAGNOSIS — J3081 Allergic rhinitis due to animal (cat) (dog) hair and dander: Secondary | ICD-10-CM | POA: Diagnosis not present

## 2020-05-23 LAB — CYTOLOGY - PAP
Comment: NEGATIVE
Diagnosis: UNDETERMINED — AB
High risk HPV: NEGATIVE

## 2020-06-01 DIAGNOSIS — J3081 Allergic rhinitis due to animal (cat) (dog) hair and dander: Secondary | ICD-10-CM | POA: Diagnosis not present

## 2020-06-01 DIAGNOSIS — J301 Allergic rhinitis due to pollen: Secondary | ICD-10-CM | POA: Diagnosis not present

## 2020-06-01 DIAGNOSIS — J3089 Other allergic rhinitis: Secondary | ICD-10-CM | POA: Diagnosis not present

## 2020-06-06 DIAGNOSIS — F411 Generalized anxiety disorder: Secondary | ICD-10-CM | POA: Diagnosis not present

## 2020-06-08 DIAGNOSIS — J301 Allergic rhinitis due to pollen: Secondary | ICD-10-CM | POA: Diagnosis not present

## 2020-06-08 DIAGNOSIS — J3089 Other allergic rhinitis: Secondary | ICD-10-CM | POA: Diagnosis not present

## 2020-06-08 DIAGNOSIS — J3081 Allergic rhinitis due to animal (cat) (dog) hair and dander: Secondary | ICD-10-CM | POA: Diagnosis not present

## 2020-06-10 ENCOUNTER — Other Ambulatory Visit: Payer: Self-pay | Admitting: Obstetrics and Gynecology

## 2020-06-13 DIAGNOSIS — J3089 Other allergic rhinitis: Secondary | ICD-10-CM | POA: Diagnosis not present

## 2020-06-13 DIAGNOSIS — J3081 Allergic rhinitis due to animal (cat) (dog) hair and dander: Secondary | ICD-10-CM | POA: Diagnosis not present

## 2020-06-13 DIAGNOSIS — J301 Allergic rhinitis due to pollen: Secondary | ICD-10-CM | POA: Diagnosis not present

## 2020-06-22 DIAGNOSIS — J301 Allergic rhinitis due to pollen: Secondary | ICD-10-CM | POA: Diagnosis not present

## 2020-06-22 DIAGNOSIS — J3089 Other allergic rhinitis: Secondary | ICD-10-CM | POA: Diagnosis not present

## 2020-06-22 DIAGNOSIS — J3081 Allergic rhinitis due to animal (cat) (dog) hair and dander: Secondary | ICD-10-CM | POA: Diagnosis not present

## 2020-06-27 DIAGNOSIS — J3081 Allergic rhinitis due to animal (cat) (dog) hair and dander: Secondary | ICD-10-CM | POA: Diagnosis not present

## 2020-06-27 DIAGNOSIS — J3089 Other allergic rhinitis: Secondary | ICD-10-CM | POA: Diagnosis not present

## 2020-06-27 DIAGNOSIS — J301 Allergic rhinitis due to pollen: Secondary | ICD-10-CM | POA: Diagnosis not present

## 2020-06-30 ENCOUNTER — Other Ambulatory Visit: Payer: Self-pay | Admitting: Obstetrics and Gynecology

## 2020-07-04 DIAGNOSIS — J301 Allergic rhinitis due to pollen: Secondary | ICD-10-CM | POA: Diagnosis not present

## 2020-07-04 DIAGNOSIS — J3081 Allergic rhinitis due to animal (cat) (dog) hair and dander: Secondary | ICD-10-CM | POA: Diagnosis not present

## 2020-07-04 DIAGNOSIS — J3089 Other allergic rhinitis: Secondary | ICD-10-CM | POA: Diagnosis not present

## 2020-07-11 DIAGNOSIS — J3081 Allergic rhinitis due to animal (cat) (dog) hair and dander: Secondary | ICD-10-CM | POA: Diagnosis not present

## 2020-07-11 DIAGNOSIS — J3089 Other allergic rhinitis: Secondary | ICD-10-CM | POA: Diagnosis not present

## 2020-07-11 DIAGNOSIS — J301 Allergic rhinitis due to pollen: Secondary | ICD-10-CM | POA: Diagnosis not present

## 2020-07-13 DIAGNOSIS — D2262 Melanocytic nevi of left upper limb, including shoulder: Secondary | ICD-10-CM | POA: Diagnosis not present

## 2020-07-13 DIAGNOSIS — D225 Melanocytic nevi of trunk: Secondary | ICD-10-CM | POA: Diagnosis not present

## 2020-07-13 DIAGNOSIS — D2271 Melanocytic nevi of right lower limb, including hip: Secondary | ICD-10-CM | POA: Diagnosis not present

## 2020-07-13 DIAGNOSIS — L570 Actinic keratosis: Secondary | ICD-10-CM | POA: Diagnosis not present

## 2020-07-13 DIAGNOSIS — X32XXXA Exposure to sunlight, initial encounter: Secondary | ICD-10-CM | POA: Diagnosis not present

## 2020-07-13 DIAGNOSIS — D2261 Melanocytic nevi of right upper limb, including shoulder: Secondary | ICD-10-CM | POA: Diagnosis not present

## 2020-07-18 DIAGNOSIS — J3081 Allergic rhinitis due to animal (cat) (dog) hair and dander: Secondary | ICD-10-CM | POA: Diagnosis not present

## 2020-07-18 DIAGNOSIS — J3089 Other allergic rhinitis: Secondary | ICD-10-CM | POA: Diagnosis not present

## 2020-07-18 DIAGNOSIS — F411 Generalized anxiety disorder: Secondary | ICD-10-CM | POA: Diagnosis not present

## 2020-07-18 DIAGNOSIS — J301 Allergic rhinitis due to pollen: Secondary | ICD-10-CM | POA: Diagnosis not present

## 2020-07-29 ENCOUNTER — Other Ambulatory Visit: Payer: Self-pay | Admitting: Obstetrics and Gynecology

## 2020-07-29 DIAGNOSIS — N3001 Acute cystitis with hematuria: Secondary | ICD-10-CM

## 2020-08-01 DIAGNOSIS — J3081 Allergic rhinitis due to animal (cat) (dog) hair and dander: Secondary | ICD-10-CM | POA: Diagnosis not present

## 2020-08-01 DIAGNOSIS — J3089 Other allergic rhinitis: Secondary | ICD-10-CM | POA: Diagnosis not present

## 2020-08-01 DIAGNOSIS — J301 Allergic rhinitis due to pollen: Secondary | ICD-10-CM | POA: Diagnosis not present

## 2020-08-07 ENCOUNTER — Other Ambulatory Visit: Payer: Self-pay | Admitting: Obstetrics and Gynecology

## 2020-08-08 DIAGNOSIS — J301 Allergic rhinitis due to pollen: Secondary | ICD-10-CM | POA: Diagnosis not present

## 2020-08-08 DIAGNOSIS — J3089 Other allergic rhinitis: Secondary | ICD-10-CM | POA: Diagnosis not present

## 2020-08-08 DIAGNOSIS — J3081 Allergic rhinitis due to animal (cat) (dog) hair and dander: Secondary | ICD-10-CM | POA: Diagnosis not present

## 2020-08-15 DIAGNOSIS — J3081 Allergic rhinitis due to animal (cat) (dog) hair and dander: Secondary | ICD-10-CM | POA: Diagnosis not present

## 2020-08-15 DIAGNOSIS — J3089 Other allergic rhinitis: Secondary | ICD-10-CM | POA: Diagnosis not present

## 2020-08-15 DIAGNOSIS — J301 Allergic rhinitis due to pollen: Secondary | ICD-10-CM | POA: Diagnosis not present

## 2020-08-20 ENCOUNTER — Other Ambulatory Visit: Payer: Self-pay | Admitting: Obstetrics and Gynecology

## 2020-08-21 NOTE — Telephone Encounter (Signed)
Please advise 

## 2020-08-22 DIAGNOSIS — Z1152 Encounter for screening for COVID-19: Secondary | ICD-10-CM | POA: Diagnosis not present

## 2020-08-22 DIAGNOSIS — J301 Allergic rhinitis due to pollen: Secondary | ICD-10-CM | POA: Diagnosis not present

## 2020-08-22 DIAGNOSIS — J3089 Other allergic rhinitis: Secondary | ICD-10-CM | POA: Diagnosis not present

## 2020-08-22 DIAGNOSIS — Z03818 Encounter for observation for suspected exposure to other biological agents ruled out: Secondary | ICD-10-CM | POA: Diagnosis not present

## 2020-08-22 DIAGNOSIS — F411 Generalized anxiety disorder: Secondary | ICD-10-CM | POA: Diagnosis not present

## 2020-08-22 DIAGNOSIS — J3081 Allergic rhinitis due to animal (cat) (dog) hair and dander: Secondary | ICD-10-CM | POA: Diagnosis not present

## 2020-08-28 DIAGNOSIS — J301 Allergic rhinitis due to pollen: Secondary | ICD-10-CM | POA: Diagnosis not present

## 2020-08-28 DIAGNOSIS — J3081 Allergic rhinitis due to animal (cat) (dog) hair and dander: Secondary | ICD-10-CM | POA: Diagnosis not present

## 2020-08-28 DIAGNOSIS — J3089 Other allergic rhinitis: Secondary | ICD-10-CM | POA: Diagnosis not present

## 2020-08-29 ENCOUNTER — Encounter: Payer: Self-pay | Admitting: Obstetrics and Gynecology

## 2020-08-29 ENCOUNTER — Ambulatory Visit (INDEPENDENT_AMBULATORY_CARE_PROVIDER_SITE_OTHER): Payer: BC Managed Care – PPO | Admitting: Obstetrics and Gynecology

## 2020-08-29 ENCOUNTER — Other Ambulatory Visit: Payer: Self-pay

## 2020-08-29 VITALS — BP 110/70 | Ht 66.0 in | Wt 121.0 lb

## 2020-08-29 DIAGNOSIS — R87610 Atypical squamous cells of undetermined significance on cytologic smear of cervix (ASC-US): Secondary | ICD-10-CM | POA: Diagnosis not present

## 2020-08-29 DIAGNOSIS — Z01419 Encounter for gynecological examination (general) (routine) without abnormal findings: Secondary | ICD-10-CM

## 2020-08-29 DIAGNOSIS — Z1231 Encounter for screening mammogram for malignant neoplasm of breast: Secondary | ICD-10-CM

## 2020-08-29 DIAGNOSIS — J3081 Allergic rhinitis due to animal (cat) (dog) hair and dander: Secondary | ICD-10-CM | POA: Diagnosis not present

## 2020-08-29 DIAGNOSIS — A6004 Herpesviral vulvovaginitis: Secondary | ICD-10-CM

## 2020-08-29 DIAGNOSIS — J301 Allergic rhinitis due to pollen: Secondary | ICD-10-CM | POA: Diagnosis not present

## 2020-08-29 DIAGNOSIS — N390 Urinary tract infection, site not specified: Secondary | ICD-10-CM

## 2020-08-29 DIAGNOSIS — J3089 Other allergic rhinitis: Secondary | ICD-10-CM | POA: Diagnosis not present

## 2020-08-29 MED ORDER — NITROFURANTOIN MONOHYD MACRO 100 MG PO CAPS: ORAL_CAPSULE | ORAL | 2 refills | Status: DC

## 2020-08-29 MED ORDER — VALACYCLOVIR HCL 500 MG PO TABS: 500.0000 mg | ORAL_TABLET | Freq: Every day | ORAL | 3 refills | Status: DC

## 2020-08-29 MED ORDER — AZITHROMYCIN 500 MG PO TABS: 500.0000 mg | ORAL_TABLET | Freq: Every day | ORAL | 0 refills | Status: DC

## 2020-08-29 NOTE — Progress Notes (Signed)
Chief Complaint  Patient presents with  . Gynecologic Exam     HPI:      Veronica Gibson is a 44 y.o. [phone] who LMP was No LMP recorded. (Menstrual status: IUD)., presents today for her annual examination.  Her menses are absent with IUD, placed 3/19. Has occas light spotting, no dysmen.  Sex activity: single partner, contraception - IUD. Mirena placed 11/25/17.  Last Pap: 05/18/20 Results were: ASCUS /neg HPV DNA; repeat due after 3 yrs but will do next yr at 12/22 annual due to pt reassurance.  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 Taking macrobid postcoitally with sx control. Doing well. Had yeast vag 8/21 with sx relief after tx.  Last mammo: 11/19/19 Results were normal, repeat in 12 months There is no FH of breast cancer. There is no FH of ovarian cancer. The patient does not do self-breast exams.  Tobacco use: The patient denies current or previous tobacco use. Alcohol use: social drinker  No drug use. Exercise: very active  She does get adequate calcium and Vitamin D in her diet. Labs with PCP  Past Medical History:  Diagnosis Date  . Anxiety   . Genital herpes 03/2020   confirmed by HSV 2 IgG  . Head ache   . History of mammogram 12/13/2013   CAT 2  . History of Papanicolaou smear of cervix 04/07/2014   -/-  . Rupture Achilles tendon 08/14/2017   Right  . Seasonal allergies   . Vaccine for human papilloma virus (HPV) types 6, 11, 16, and 18 administered     Past Surgical History:  Procedure Laterality Date  . ACHILLES TENDON REPAIR  08/18/2017  . WISDOM TOOTH EXTRACTION      Family History  Problem Relation Age of Onset  . Cancer Paternal Grandmother 66       UTERINE  . Coronary artery disease Paternal Grandfather        CABG  . Cancer Paternal Aunt        uterine  . Cancer Other        uterine    Social History   Socioeconomic History  .  Marital status: Divorced    Spouse name: Not on file  . Number of children: 2  . Years of education: 14  . Highest education level: Not on file  Occupational History  . Occupation: Designer, jewellery: [employer]  Tobacco Use  . Smoking status: Never Smoker  . Smokeless tobacco: Never Used  Vaping Use  . Vaping Use: Never used  Substance and Sexual Activity  . Alcohol use: Yes    Comment: OCC  . Drug use: No  . Sexual activity: Yes    Birth control/protection: I.U.D.    Comment: Mirena  Other Topics Concern  . Not on file  Social History Narrative  . Not on file   Social Determinants of Health   Financial Resource Strain:   . Difficulty of Paying Living Expenses: Not on file  Food Insecurity:   . Worried About Charity fundraiser in the Last Year: Not on file  . Ran Out of Food in the Last Year: Not on file  Transportation Needs:   . Lack of Transportation (Medical): Not on file  . Lack of Transportation (Non-Medical): Not on file  Physical Activity:   . Days of Exercise per Week:  Not on file  . Minutes of Exercise per Session: Not on file  Stress:   . Feeling of Stress : Not on file  Social Connections:   . Frequency of Communication with Friends and Family: Not on file  . Frequency of Social Gatherings with Friends and Family: Not on file  . Attends Religious Services: Not on file  . Active Member of Clubs or Organizations: Not on file  . Attends Archivist Meetings: Not on file  . Marital Status: Not on file  Intimate Partner Violence:   . Fear of Current or Ex-Partner: Not on file  . Emotionally Abused: Not on file  . Physically Abused: Not on file  . Sexually Abused: Not on file     Current Outpatient Medications:  .  azelastine (ASTELIN) 0.1 % nasal spray, SMARTSIG:1-2 Spray(s) Both Nares Twice Daily, Disp: , Rfl:  .  fexofenadine (QC FEXOFENADINE HYDROCHLORIDE) 180 MG tablet, , Disp: , Rfl:  .  fluticasone (FLONASE) 50 MCG/ACT nasal  spray, PLACE 1-2 SPRAY IN EACH NOSTRIL ONCE A DAY, Disp: , Rfl: 3 .  loratadine (CLARITIN) 10 MG tablet, Take 10 mg by mouth 2 (two) times daily. , Disp: , Rfl:  .  nitrofurantoin, macrocrystal-monohydrate, (MACROBID) 100 MG capsule, TAKE 1 CAPSULE BY MOUTH EVERY DAY AFTER SEX AS PREVENTATIVE, Disp: 30 capsule, Rfl: 2 .  valACYclovir (VALTREX) 500 MG tablet, Take 1 tablet (500 mg total) by mouth daily., Disp: 90 tablet, Rfl: 3 .  azithromycin (ZITHROMAX) 500 MG tablet, Take 1 tablet (500 mg total) by mouth daily. Prn traveler's diarrhea, Disp: 3 tablet, Rfl: 0 .  levonorgestrel (MIRENA, 52 MG,) 20 MCG/24HR IUD, 1 Intra Uterine Device (1 each total) by Intrauterine route once for 1 dose., Disp: 1 Intra Uterine Device, Rfl: 0  ROS:  Review of Systems  Constitutional: Negative for fatigue, fever and unexpected weight change.  Respiratory: Negative for cough, shortness of breath and wheezing.   Cardiovascular: Negative for chest pain, palpitations and leg swelling.  Gastrointestinal: Negative for blood in stool, constipation, diarrhea, nausea and vomiting.  Endocrine: Negative for cold intolerance, heat intolerance and polyuria.  Genitourinary: Negative for dyspareunia, dysuria, flank pain, frequency, genital sores, hematuria, menstrual problem, pelvic pain, urgency, vaginal bleeding, vaginal discharge and vaginal pain.  Musculoskeletal: Negative for back pain, joint swelling and myalgias.  Skin: Negative for rash.  Neurological: Negative for dizziness, syncope, light-headedness, numbness and headaches.  Hematological: Negative for adenopathy.  Psychiatric/Behavioral: Negative for agitation, confusion, dysphoric mood, sleep disturbance and suicidal ideas. The patient is not nervous/anxious.      Objective: BP 110/70   Ht 5\' 6"  (1.676 m)   Wt 121 lb (54.9 kg)   BMI 19.53 kg/m    Physical Exam Constitutional:      Appearance: She is well-developed.  Genitourinary:     Vulva, vagina,  cervix, uterus, right adnexa and left adnexa normal.     No vulval lesion or tenderness noted.     No vaginal discharge, erythema or tenderness.     No cervical polyp.     IUD strings visualized.     Uterus is not enlarged or tender.     No right or left adnexal mass present.     Right adnexa not tender.     Left adnexa not tender.  Neck:     Thyroid: No thyromegaly.  Cardiovascular:     Rate and Rhythm: Normal rate and regular rhythm.     Heart sounds: Normal heart  sounds. No murmur heard.   Pulmonary:     Effort: Pulmonary effort is normal.     Breath sounds: Normal breath sounds.  Chest:     Breasts:        Right: No mass, nipple discharge, skin change or tenderness.        Left: No mass, nipple discharge, skin change or tenderness.  Abdominal:     Palpations: Abdomen is soft.     Tenderness: There is no abdominal tenderness. There is no guarding.  Musculoskeletal:        General: Normal range of motion.     Cervical back: Normal range of motion.  Neurological:     General: No focal deficit present.     Mental Status: She is alert and oriented to person, place, and time.     Cranial Nerves: No cranial nerve deficit.  Skin:    General: Skin is warm and dry.  Psychiatric:        Mood and Affect: Mood normal.        Behavior: Behavior normal.        Thought Content: Thought content normal.        Judgment: Judgment normal.  Vitals reviewed.     Assessment/Plan: Encounter for annual routine gynecological examination  ASCUS of cervix with negative high risk HPV--will repeat pap 12/22  Encounter for screening mammogram for malignant neoplasm of breast - Plan: MM 3D SCREEN BREAST BILATERAL; pt to sched mammo  Herpes simplex vulvovaginitis - Plan: valACYclovir (VALTREX) 500 MG tablet; Rx RF, taking daily.   Postcoital UTI - Plan: nitrofurantoin, macrocrystal-monohydrate, (MACROBID) 100 MG capsule; Rx RF. F/u prn RF.   Rx azithro eRxd in case of travelers diarrhea when  in [place] for work 1/22. Hx of achilles tendon issues and doesn't want cipro.  Meds ordered this encounter  Medications  . azithromycin (ZITHROMAX) 500 MG tablet    Sig: Take 1 tablet (500 mg total) by mouth daily. Prn traveler's diarrhea    Dispense:  3 tablet    Refill:  0    Order Specific Question:   Supervising Provider    Answer:   Gae Dry [id number]  . valACYclovir (VALTREX) 500 MG tablet    Sig: Take 1 tablet (500 mg total) by mouth daily.    Dispense:  90 tablet    Refill:  3    Order Specific Question:   Supervising Provider    Answer:   Gae Dry [id number]  . nitrofurantoin, macrocrystal-monohydrate, (MACROBID) 100 MG capsule    Sig: TAKE 1 CAPSULE BY MOUTH EVERY DAY AFTER SEX AS PREVENTATIVE    Dispense:  30 capsule    Refill:  2    Order Specific Question:   Supervising Provider    Answer:   Gae Dry [[id number]]               GYN counsel mammography screening, adequate intake of calcium and vitamin D    F/U  Return in about 1 year (around 08/29/2021).  Veronica Housh B. Khole Arterburn, PA-C 08/29/2020 2:50 PM

## 2020-08-29 NOTE — Patient Instructions (Signed)
I value your feedback and entrusting us with your care. If you get a [facility] patient survey, I would appreciate you taking the time to let us know about your experience today. Thank you!  As of September 02, 2019, your lab results will be released to your MyChart immediately, before I even have a chance to see them. Please give me time to review them and contact you if there are any abnormalities. Thank you for your patience.   [facility]: [phone]  [facility]: [phone]  

## 2020-09-05 DIAGNOSIS — J3081 Allergic rhinitis due to animal (cat) (dog) hair and dander: Secondary | ICD-10-CM | POA: Diagnosis not present

## 2020-09-05 DIAGNOSIS — J301 Allergic rhinitis due to pollen: Secondary | ICD-10-CM | POA: Diagnosis not present

## 2020-09-05 DIAGNOSIS — J3089 Other allergic rhinitis: Secondary | ICD-10-CM | POA: Diagnosis not present

## 2020-09-06 DIAGNOSIS — R17 Unspecified jaundice: Secondary | ICD-10-CM | POA: Insufficient documentation

## 2020-09-06 DIAGNOSIS — D72819 Decreased white blood cell count, unspecified: Secondary | ICD-10-CM | POA: Insufficient documentation

## 2020-09-06 DIAGNOSIS — M25561 Pain in right knee: Secondary | ICD-10-CM | POA: Insufficient documentation

## 2020-09-06 NOTE — Progress Notes (Signed)
Patient ID: Veronica Gibson, female    DOB: 07/04/1976, 44 y.o.   MRN: [id number]  PCP: [name], MD  Chief Complaint  Patient presents with  . Follow-up    6 month follow up abnormal labs    Subjective:   Veronica Gibson is a 44 y.o. female, presents to clinic with CC of the following:  Chief Complaint  Patient presents with  . Follow-up    6 month follow up abnormal labs    HPI:  Patient is a 44 y.o. female who established with the practice on 02/23/2020 Follows up today Communication with lab results after that visit was as follows:  The complete metabolic panel was entirely normal with the only exception being a slightly high total bilirubin. All other liver function tests were normal, and with clinical correlation, this is not a major concern presently. You possibly have a condition called Gilberts syndrome, which does not require any further work-up, and we will continue to monitor presently with repeat checks over time. The thyroid screen (TSH) was normal The lipid panel was good with the cholesterol 142, and the LDL (lousy type) of cholesterol good at 80 (desired less than 100) The complete blood count showed a slightly low white count at 3.0, with the type of white cell that was slightly low being the neutrophils. There was no evidence of anemia. It would be good to recheck the labs again in about 6 months time to follow-up, and please try to schedule a follow-up in that time to do so, and can always follow-up sooner as needed.  All in all, she has been feeling pretty good  She just had her annual with OB/GYN recently 08/29/2020 with the following noted:  Veronica Gibson is a 44 y.o. [id number] who LMP was No LMP recorded. (Menstrual status: IUD)., presents today for her annual examination.  Her menses are absent with IUD, placed 3/19. Has occas light spotting, no dysmen.  Sex activity: single partner, contraception - IUD. Mirena placed  11/25/17.  Last Pap: 05/18/20 Results were: ASCUS /neg HPV DNA; repeat due after 3 yrs but will do next yr at 12/22 annual due to pt reassurance.  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 Taking macrobid postcoitally with sx control. Doing well. Had yeast vag 8/21 with sx relief after tx.  Last mammo: 11/19/19 Results were normal, repeat in 12 months There is no FH of breast cancer. There is no FH of ovarian cancer. The patient does not do self-breast exams.  Tobacco use: The patient denies current or previous tobacco use. Alcohol use: social drinker  No drug use. Exercise: very active  She does get adequate calcium and Vitamin D in her diet.  Assessment/plan was as follows: Assessment/Plan: Encounter for annual routine gynecological examination  ASCUS of cervix with negative high risk HPV--will repeat pap 12/22  Encounter for screening mammogram for malignant neoplasm of breast - Plan: MM 3D SCREEN BREAST BILATERAL; pt to sched mammo  Herpes simplex vulvovaginitis - Plan: valACYclovir (VALTREX) 500 MG tablet; Rx RF, taking daily.   Postcoital UTI - Plan: nitrofurantoin, macrocrystal-monohydrate, (MACROBID) 100 MG capsule; Rx RF. F/u prn RF.   Rx azithro eRxd in case of travelers diarrhea when in [place] for work 1/22. Hx of achilles tendon issues and doesn't want cipro.   Elevated total bilirubin/question Gilbert's syndrome Denies  any recent abdominal pains, change in bowel movements, no dark or black stools, no bleeding per rectum, no yellow eyes or skin  Mild leukopenia -noted on last lab check  She currently sees an allergist, and notes severe allergies which are mostly environmental.  She gets allergy shots once a week.  She also takes nonsedating antihistamines and nasal sprays to help manage her symptoms.  She noted today she would likely continue to get allergy shots for much of  her adult life.  She also has a history of anxiety, was on medications in the past although not in the more recent past and notes she would prefer to keep it that way.  She does touch base with a nurse practitioner in a psychiatry office once a year still, and also sees counselor.   She denies any recent depression concerns.  right knee discomfort  She was seen by a provider in [facility] for evaluation and felt to be patellofemoral syndrome. She has a history of Achilles tendon rupture and surgery, and still has some weakness in the right lower extremity which she thinks contributed some to the knee pain. Doing pretty good in recent past  Patient Active Problem List   Diagnosis Date Noted  . Arthralgia of right lower leg 09/06/2020  . Increased bilirubin level 09/06/2020  . Leukopenia 09/06/2020  . Herpes simplex vulvovaginitis 08/29/2020  . ASCUS of cervix with negative high risk HPV 08/29/2020  . Recurrent UTI 02/23/2020  . H/O anxiety disorder 02/23/2020  . Allergies 02/23/2020  . Achilles rupture, right 08/10/2017  . Metatarsalgia of both feet 09/29/2013  . Patellar tendinitis 09/29/2013  . Abnormality of gait 03/24/2013  . 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 TENDINITIS, TIBIALIS 04/03/2007  . KNEE PAIN, RIGHT 03/13/2007      Current Outpatient Medications:  .  azelastine (ASTELIN) 0.1 % nasal spray, SMARTSIG:1-2 Spray(s) Both Nares Twice Daily, Disp: , Rfl:  .  fexofenadine (ALLEGRA) 180 MG tablet, , Disp: , Rfl:  .  fluticasone (FLONASE) 50 MCG/ACT nasal spray, PLACE 1-2 SPRAY IN EACH NOSTRIL ONCE A DAY, Disp: , Rfl: 3 .  loratadine (CLARITIN) 10 MG tablet, Take 10 mg by mouth 2 (two) times daily. , Disp: , Rfl:  .  nitrofurantoin, macrocrystal-monohydrate, (MACROBID) 100 MG capsule, TAKE 1 CAPSULE BY MOUTH EVERY DAY AFTER SEX AS PREVENTATIVE, Disp: 30 capsule, Rfl: 2 .  valACYclovir (VALTREX) 500 MG tablet, Take 1 tablet (500 mg total) by mouth  daily., Disp: 90 tablet, Rfl: 3 .  levonorgestrel (MIRENA, 52 MG,) 20 MCG/24HR IUD, 1 Intra Uterine Device (1 each total) by Intrauterine route once for 1 dose., Disp: 1 Intra Uterine Device, Rfl: 0   Allergies  Allergen Reactions  . Dust Mite Mixed Allergen Ext [Mite (D. Farinae)] Other (See Comments)    And mold  . Other     Other reaction(s): Other (See Comments) Trees, dust  . Pollen Extract     Other reaction(s): Unknown  . Shellfish Allergy      Past Surgical History:  Procedure Laterality Date  . ACHILLES TENDON REPAIR  08/18/2017  . WISDOM TOOTH EXTRACTION       Family History  Problem Relation Age of Onset  . Cancer Paternal Grandmother 39       UTERINE  . Coronary artery disease Paternal Grandfather        CABG  . Cancer Paternal Aunt        uterine  . Cancer Other  uterine     Social History   Tobacco Use  . Smoking status: Never Smoker  . Smokeless tobacco: Never Used  Substance Use Topics  . Alcohol use: Yes    Comment: OCC    With staff assistance, above reviewed with the patient today.  ROS: As per HPI, otherwise no specific complaints on a limited and focused system review   No results found for this or any previous visit (from the past 72 hour(s)).   PHQ2/9: Depression screen [facility] 2/9 09/07/2020 02/23/2020  Decreased Interest 0 0  Down, Depressed, Hopeless 0 0  PHQ - 2 Score 0 0  Altered sleeping - 1  Tired, decreased energy - 0  Change in appetite - 0  Feeling bad or failure about yourself  - 0  Trouble concentrating - 0  Moving slowly or fidgety/restless - 0  Suicidal thoughts - 0  PHQ-9 Score - 1  Difficult doing work/chores - Not difficult at all   PHQ-2/9 Result is neg  Fall Risk: Fall Risk  09/07/2020 02/23/2020  Falls in the past year? 0 0  Number falls in past yr: 0 0  Injury with Fall? 0 0  Follow up Falls evaluation completed -      Objective:   Vitals:   09/07/20 0821  BP: 120/70  Pulse: 90  Resp: 16   Temp: 98.6 F (37 C)  TempSrc: Oral  SpO2: 98%  Weight: 120 lb 4.8 oz (54.6 kg)  Height: 5\' 6"  (1.676 m)    Body mass index is 19.42 kg/m.  Physical Exam   NAD, masked, pleasant HEENT - /AT, sclera anicteric, positive contacts, PERRL, EOMI, conj - non-inj'ed, pharynx clear Neck - supple, no adenopathy, carotids 2+ and = without bruits bilat Car - RRR without m/g/r Pulm- RR and effort normal at rest, CTA without wheeze or rales Abd - soft, NT diffusely, ND,  no masses, no HSM Back - no CVA tenderness Ext - no LE edema,  Neuro/psychiatric - affect was not flat, appropriate with conversation             Alert, speech normal             Grossly non-focal   Results for orders placed or performed in visit on 05/18/20  Cytology - PAP  Result Value Ref Range   High risk HPV Negative    Adequacy      Satisfactory for evaluation; transformation zone component PRESENT.   Diagnosis (A)     - Atypical squamous cells of undetermined significance (ASC-US)   Microorganisms      Fungal organisms present consistent with Candida spp.   Comment Normal Reference Range HPV - Negative   Cervicovaginal ancillary only  Result Value Ref Range   Neisseria Gonorrhea Negative    Chlamydia Negative    Trichomonas Negative    Bacterial Vaginitis (gardnerella) Negative    Candida Vaginitis Positive (A)    Candida Glabrata Negative    Comment Normal Reference Range Candida Species - Negative    Comment Normal Reference Range Candida Galbrata - Negative    Comment Normal Reference Range Trichomonas - Negative    Comment Normal Reference Ranger Chlamydia - Negative    Comment      Normal Reference Range Neisseria Gonorrhea - Negative   Comment      Normal Reference Range Bacterial Vaginosis - Negative   Prior labs reviewed    Assessment & Plan:    1.  Mild increased total bilirubin/?  Gilbert's syndrome Educated patient on this possibility today,  All other liver function tests were  normal with a slightly high bilirubin noted on last check, and no symptoms of concern in the recent past. recheck labs today  2.  Mild leukopenia Recheck CBC today  3. H/O anxiety disorder Continue with counseling, and has a periodic follow-up yearly with psychiatry and will continue. Noted has been doing well in the recent past without any medications.  Did note does have stresses at times.  4. Recurrent UTI/ASCUS/Women's Health  She will continue the nitrofurantoin after intercourse as has been helpful in preventing further UTIs. Continue to follow-up with OB/GYN as planned  5.  Right knee pain-patellofemoral syndrome by history Was seen by: Sports medicine previously To follow-up if symptoms again increasing. Has been doing fairly well in the recent past  6. Allergy Continues to see the allergist, and she has weekly injections to help. Also continue with the medication regimen as it has been helpful  Follow-up in 6 months, sooner as needed and await lab results presently Also will continue to see her allergist and also gynecology for her annual woman's health assessments She is aware that her follow-up will be with a different provider as I will be leaving this practice prior to her planned follow-up.     [name], MD 09/07/20 8:34 AM

## 2020-09-07 ENCOUNTER — Encounter: Payer: Self-pay | Admitting: Internal Medicine

## 2020-09-07 ENCOUNTER — Ambulatory Visit: Payer: BC Managed Care – PPO | Admitting: Internal Medicine

## 2020-09-07 ENCOUNTER — Other Ambulatory Visit: Payer: Self-pay

## 2020-09-07 VITALS — BP 120/70 | HR 90 | Temp 98.6°F | Resp 16 | Ht 66.0 in | Wt 120.3 lb

## 2020-09-07 DIAGNOSIS — N390 Urinary tract infection, site not specified: Secondary | ICD-10-CM

## 2020-09-07 DIAGNOSIS — R87610 Atypical squamous cells of undetermined significance on cytologic smear of cervix (ASC-US): Secondary | ICD-10-CM

## 2020-09-07 DIAGNOSIS — R17 Unspecified jaundice: Secondary | ICD-10-CM

## 2020-09-07 DIAGNOSIS — Z8659 Personal history of other mental and behavioral disorders: Secondary | ICD-10-CM

## 2020-09-07 DIAGNOSIS — M25561 Pain in right knee: Secondary | ICD-10-CM

## 2020-09-07 DIAGNOSIS — D72819 Decreased white blood cell count, unspecified: Secondary | ICD-10-CM | POA: Diagnosis not present

## 2020-09-07 DIAGNOSIS — T7840XD Allergy, unspecified, subsequent encounter: Secondary | ICD-10-CM

## 2020-09-08 LAB — CBC WITH DIFFERENTIAL/PLATELET
Absolute Monocytes: 288 cells/uL (ref 200–950)
Basophils Absolute: 19 cells/uL (ref 0–200)
Basophils Relative: 0.6 %
Eosinophils Absolute: 109 cells/uL (ref 15–500)
Eosinophils Relative: 3.5 %
HCT: 36.3 % (ref 35.0–45.0)
Hemoglobin: 12.1 g/dL (ref 11.7–15.5)
Lymphs Abs: 1442 cells/uL (ref 850–3900)
MCH: 31.4 pg (ref 27.0–33.0)
MCHC: 33.3 g/dL (ref 32.0–36.0)
MCV: 94.3 fL (ref 80.0–100.0)
MPV: 10.5 fL (ref 7.5–12.5)
Monocytes Relative: 9.3 %
Neutro Abs: 1243 cells/uL — ABNORMAL LOW (ref 1500–7800)
Neutrophils Relative %: 40.1 %
Platelets: 185 10*3/uL (ref 140–400)
RBC: 3.85 10*6/uL (ref 3.80–5.10)
RDW: 11.9 % (ref 11.0–15.0)
Total Lymphocyte: 46.5 %
WBC: 3.1 10*3/uL — ABNORMAL LOW (ref 3.8–10.8)

## 2020-09-08 LAB — COMPLETE METABOLIC PANEL WITH GFR
AG Ratio: 2.6 (calc) — ABNORMAL HIGH (ref 1.0–2.5)
ALT: 17 U/L (ref 6–29)
AST: 18 U/L (ref 10–30)
Albumin: 4.2 g/dL (ref 3.6–5.1)
Alkaline phosphatase (APISO): 30 U/L — ABNORMAL LOW (ref 31–125)
BUN: 17 mg/dL (ref 7–25)
CO2: 25 mmol/L (ref 20–32)
Calcium: 9.1 mg/dL (ref 8.6–10.2)
Chloride: 106 mmol/L (ref 98–110)
Creat: 0.65 mg/dL (ref 0.50–1.10)
GFR, Est African American: 126 mL/min/{1.73_m2} (ref >=60)
GFR, Est Non African American: 109 mL/min/{1.73_m2} (ref >=60)
Globulin: 1.6 g/dL (calc) — ABNORMAL LOW (ref 1.9–3.7)
Glucose, Bld: 78 mg/dL (ref 65–99)
Potassium: 3.7 mmol/L (ref 3.5–5.3)
Sodium: 138 mmol/L (ref 135–146)
Total Bilirubin: 1.4 mg/dL — ABNORMAL HIGH (ref 0.2–1.2)
Total Protein: 5.8 g/dL — ABNORMAL LOW (ref 6.1–8.1)

## 2020-09-19 DIAGNOSIS — J301 Allergic rhinitis due to pollen: Secondary | ICD-10-CM | POA: Diagnosis not present

## 2020-09-19 DIAGNOSIS — J3089 Other allergic rhinitis: Secondary | ICD-10-CM | POA: Diagnosis not present

## 2020-09-19 DIAGNOSIS — J3081 Allergic rhinitis due to animal (cat) (dog) hair and dander: Secondary | ICD-10-CM | POA: Diagnosis not present

## 2020-10-17 DIAGNOSIS — J301 Allergic rhinitis due to pollen: Secondary | ICD-10-CM | POA: Diagnosis not present

## 2020-10-17 DIAGNOSIS — J3089 Other allergic rhinitis: Secondary | ICD-10-CM | POA: Diagnosis not present

## 2020-10-17 DIAGNOSIS — J3081 Allergic rhinitis due to animal (cat) (dog) hair and dander: Secondary | ICD-10-CM | POA: Diagnosis not present

## 2020-10-18 DIAGNOSIS — F411 Generalized anxiety disorder: Secondary | ICD-10-CM | POA: Diagnosis not present

## 2020-10-23 ENCOUNTER — Telehealth: Payer: Self-pay

## 2020-10-23 NOTE — Telephone Encounter (Signed)
Pt calling to see if [name] will give her a refill of nitrofurantoin 100mg .  [phone]  Left message to call back with her sxs are.

## 2020-10-23 NOTE — Telephone Encounter (Signed)
Pt states she has no sxs; she takes it preventatively.

## 2020-10-23 NOTE — Telephone Encounter (Signed)
#  3 months sent in 12/21. Has she used all the RF yet? If so, I'll send in more.

## 2020-10-24 NOTE — Telephone Encounter (Signed)
Spoke to pt, she said she forgot about this Rx. Will call pharmacy.

## 2020-10-26 DIAGNOSIS — J3081 Allergic rhinitis due to animal (cat) (dog) hair and dander: Secondary | ICD-10-CM | POA: Diagnosis not present

## 2020-10-26 DIAGNOSIS — J3089 Other allergic rhinitis: Secondary | ICD-10-CM | POA: Diagnosis not present

## 2020-10-26 DIAGNOSIS — J301 Allergic rhinitis due to pollen: Secondary | ICD-10-CM | POA: Diagnosis not present

## 2020-11-02 DIAGNOSIS — J3081 Allergic rhinitis due to animal (cat) (dog) hair and dander: Secondary | ICD-10-CM | POA: Diagnosis not present

## 2020-11-02 DIAGNOSIS — J3089 Other allergic rhinitis: Secondary | ICD-10-CM | POA: Diagnosis not present

## 2020-11-02 DIAGNOSIS — J301 Allergic rhinitis due to pollen: Secondary | ICD-10-CM | POA: Diagnosis not present

## 2020-11-07 DIAGNOSIS — J3081 Allergic rhinitis due to animal (cat) (dog) hair and dander: Secondary | ICD-10-CM | POA: Diagnosis not present

## 2020-11-07 DIAGNOSIS — J301 Allergic rhinitis due to pollen: Secondary | ICD-10-CM | POA: Diagnosis not present

## 2020-11-07 DIAGNOSIS — J3089 Other allergic rhinitis: Secondary | ICD-10-CM | POA: Diagnosis not present

## 2020-11-09 DIAGNOSIS — J3089 Other allergic rhinitis: Secondary | ICD-10-CM | POA: Diagnosis not present

## 2020-11-09 DIAGNOSIS — J3081 Allergic rhinitis due to animal (cat) (dog) hair and dander: Secondary | ICD-10-CM | POA: Diagnosis not present

## 2020-11-09 DIAGNOSIS — J301 Allergic rhinitis due to pollen: Secondary | ICD-10-CM | POA: Diagnosis not present

## 2020-11-09 IMAGING — MG DIGITAL SCREENING BILAT W/ TOMO W/ CAD
8 series · 9 of 24 positions shown · non-contrast
Comparison: Previous exam(s).

CLINICAL DATA: Screening.

EXAM:
DIGITAL SCREENING BILATERAL MAMMOGRAM WITH TOMO AND CAD

[R CC synth-2D]
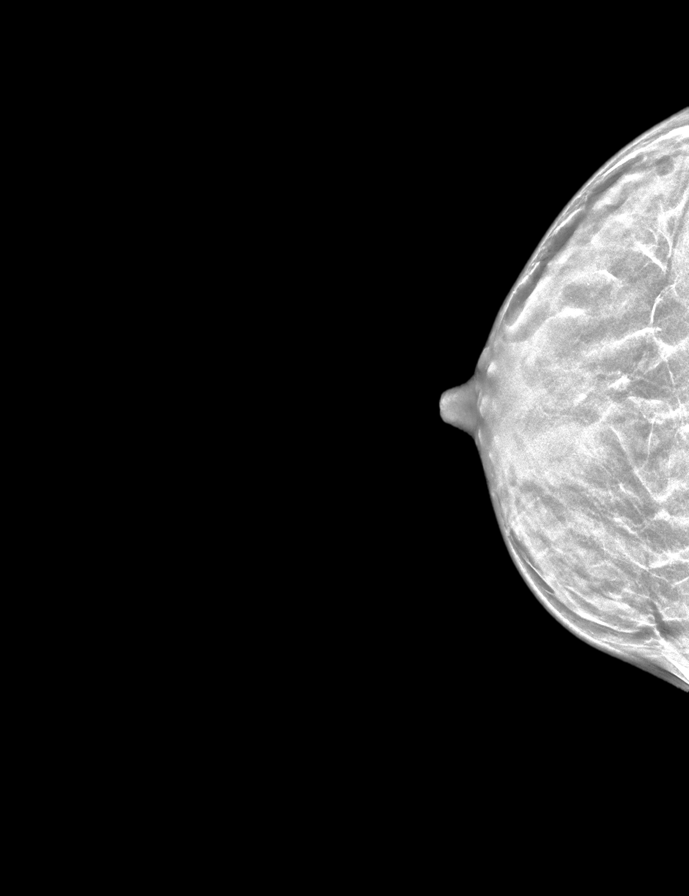

[L CC synth-2D]
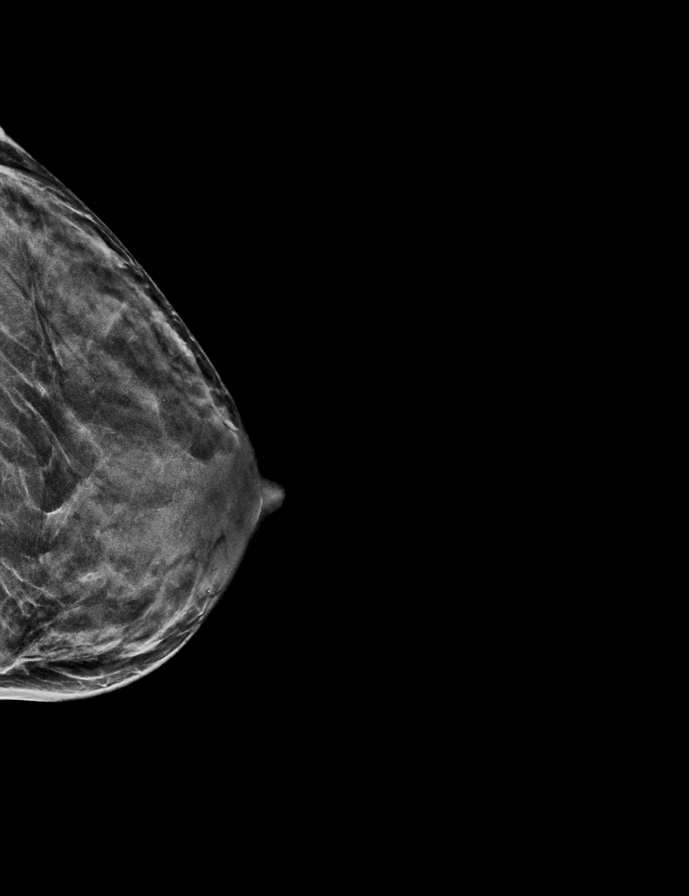

[R MLO synth-2D]
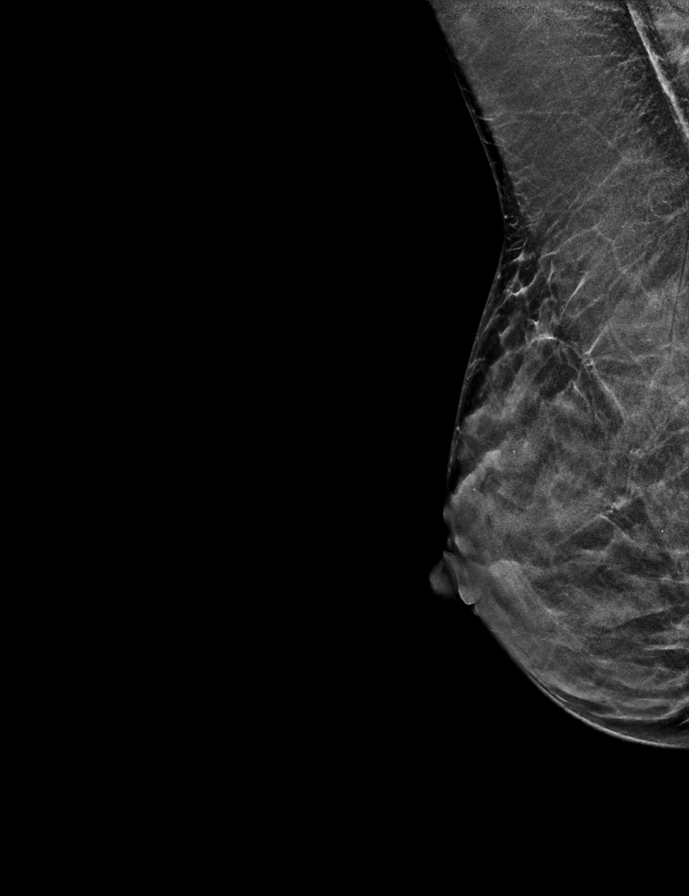

[L MLO synth-2D]
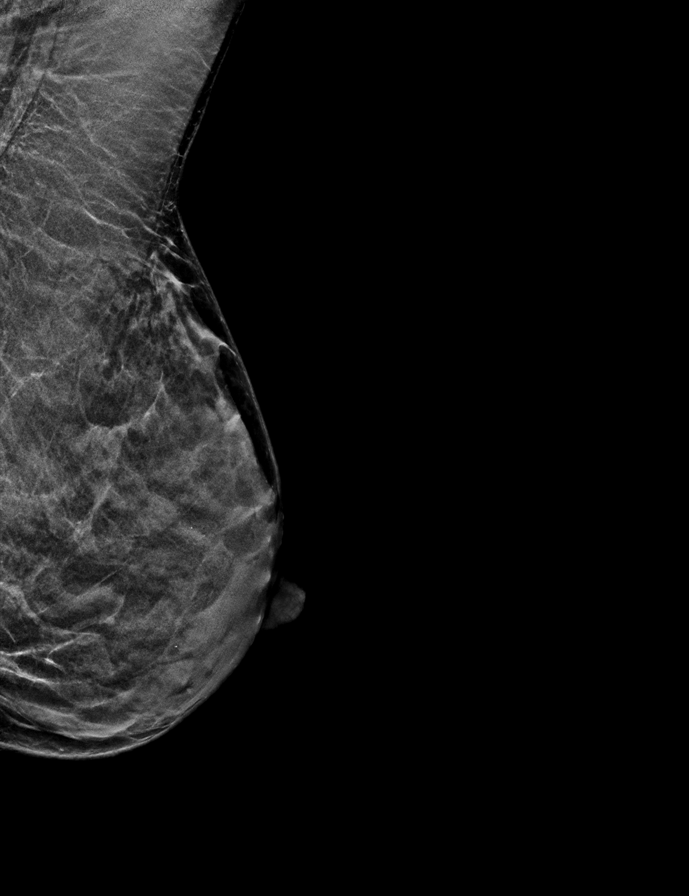

[L CC tomo · 2 of 43 frames shown]
[frame 14/43]
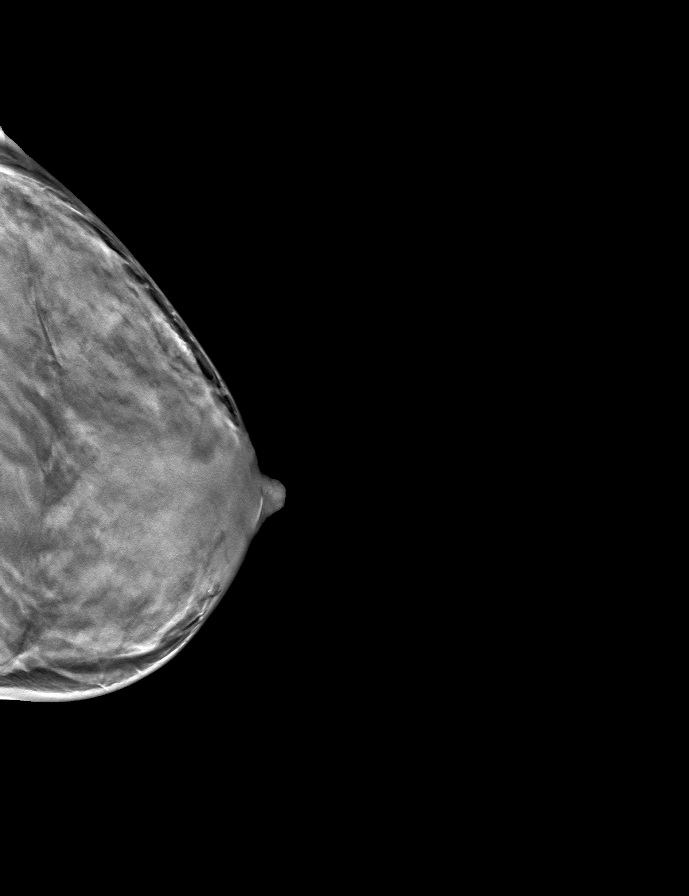
[frame 22/43]
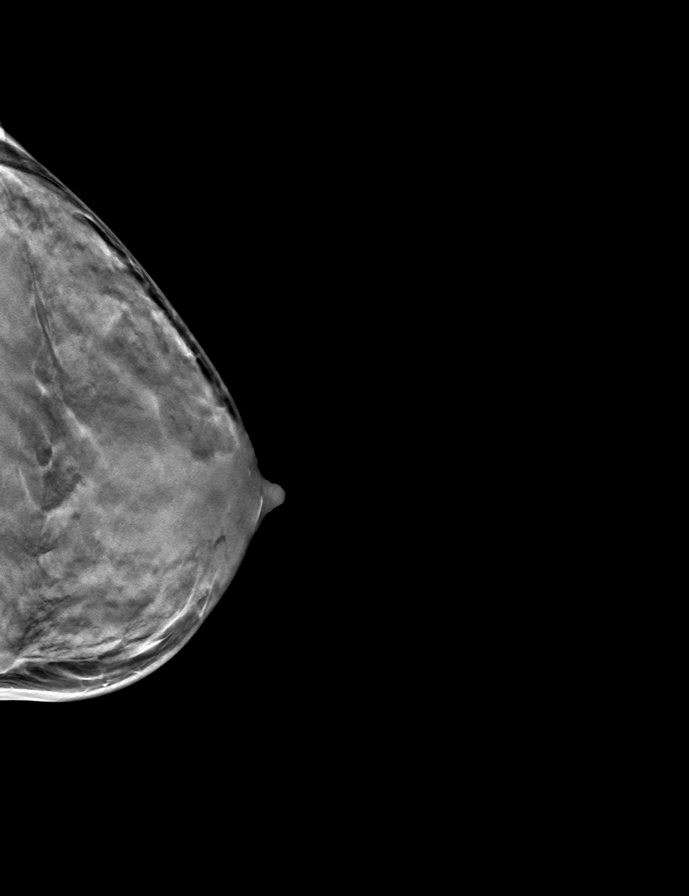

[R MLO tomo · tomo slice 19/37.0]
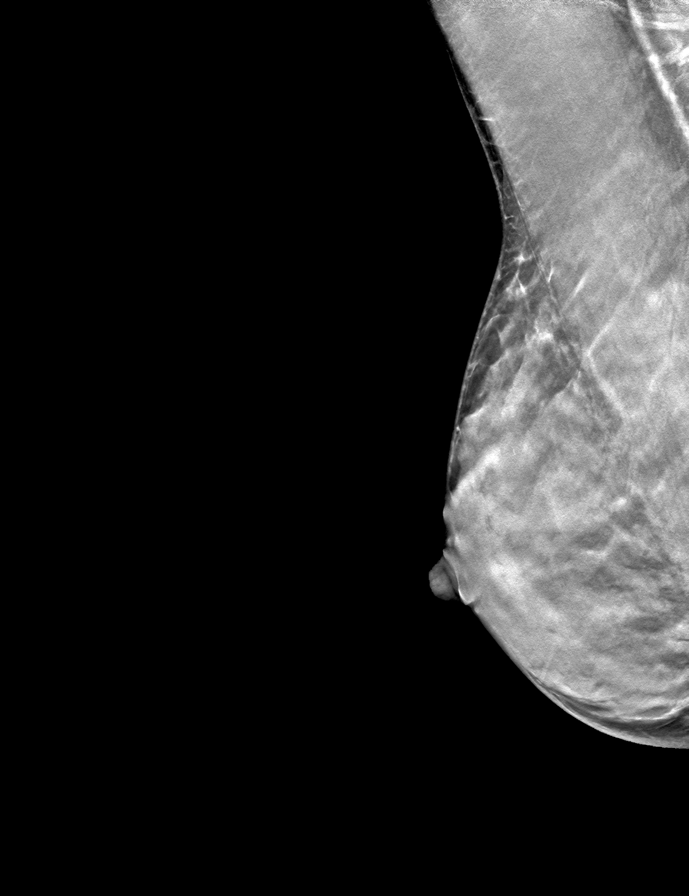

[L MLO tomo · tomo slice 17/33.0]
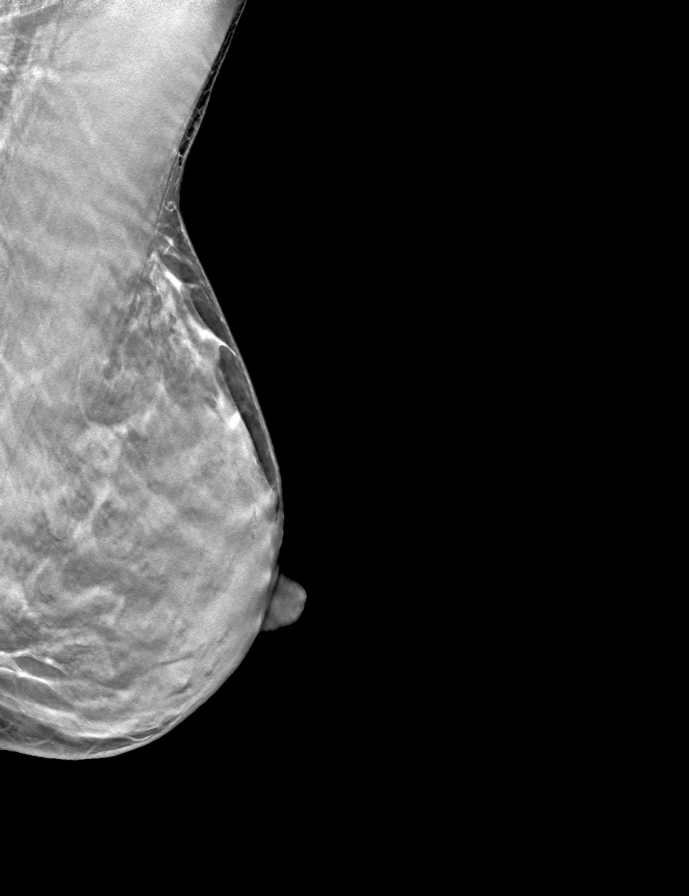

[R CC tomo · tomo slice 17/33.0]
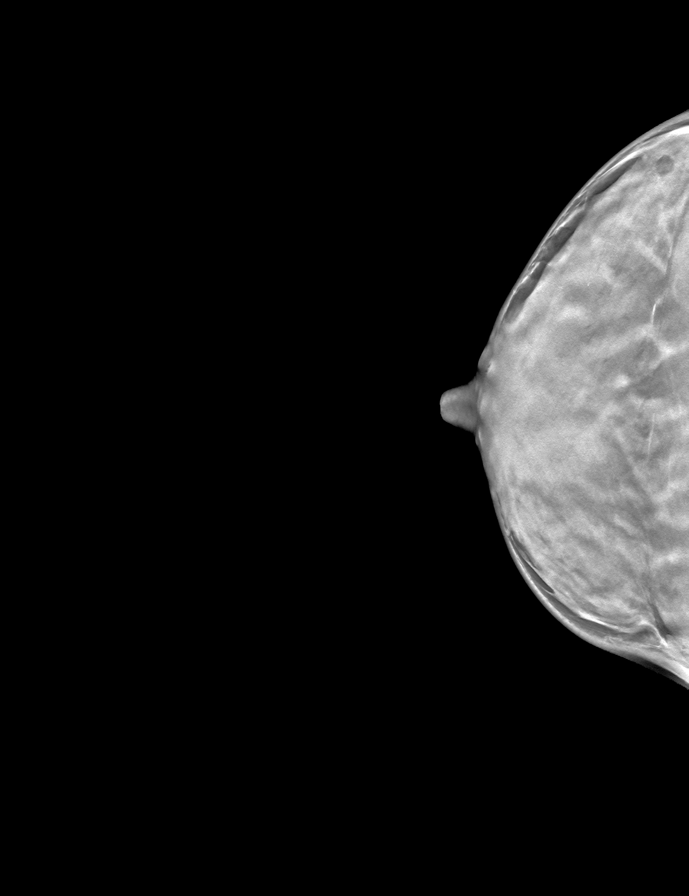

[9 of 24 positions shown; findings below may reference images not displayed]

ACR Breast Density Category d: The breast tissue is extremely dense,
which lowers the sensitivity of mammography
FINDINGS: There are no findings suspicious for malignancy. Images were
processed with CAD.
IMPRESSION: No mammographic evidence of malignancy. A result letter of this
screening mammogram will be mailed directly to the patient.

RECOMMENDATION:
Screening mammogram in one year. (Code:[id number]-[id number])

BI-RADS CATEGORY  1: Negative.

## 2020-11-14 DIAGNOSIS — J301 Allergic rhinitis due to pollen: Secondary | ICD-10-CM | POA: Diagnosis not present

## 2020-11-14 DIAGNOSIS — J3081 Allergic rhinitis due to animal (cat) (dog) hair and dander: Secondary | ICD-10-CM | POA: Diagnosis not present

## 2020-11-14 DIAGNOSIS — J3089 Other allergic rhinitis: Secondary | ICD-10-CM | POA: Diagnosis not present

## 2020-11-15 DIAGNOSIS — L700 Acne vulgaris: Secondary | ICD-10-CM | POA: Diagnosis not present

## 2020-11-16 DIAGNOSIS — J301 Allergic rhinitis due to pollen: Secondary | ICD-10-CM | POA: Diagnosis not present

## 2020-11-16 DIAGNOSIS — J3081 Allergic rhinitis due to animal (cat) (dog) hair and dander: Secondary | ICD-10-CM | POA: Diagnosis not present

## 2020-11-16 DIAGNOSIS — J3089 Other allergic rhinitis: Secondary | ICD-10-CM | POA: Diagnosis not present

## 2020-11-20 ENCOUNTER — Other Ambulatory Visit: Payer: Self-pay

## 2020-11-20 ENCOUNTER — Ambulatory Visit
Admission: RE | Admit: 2020-11-20 | Discharge: 2020-11-20 | Disposition: A | Discharge status: Home / Self Care | Payer: BC Managed Care – PPO | Source: Ambulatory Visit | Attending: Obstetrics and Gynecology | Admitting: Obstetrics and Gynecology

## 2020-11-20 DIAGNOSIS — Z1231 Encounter for screening mammogram for malignant neoplasm of breast: Secondary | ICD-10-CM | POA: Diagnosis not present

## 2020-11-21 DIAGNOSIS — J301 Allergic rhinitis due to pollen: Secondary | ICD-10-CM | POA: Diagnosis not present

## 2020-11-21 DIAGNOSIS — J3089 Other allergic rhinitis: Secondary | ICD-10-CM | POA: Diagnosis not present

## 2020-11-21 DIAGNOSIS — J3081 Allergic rhinitis due to animal (cat) (dog) hair and dander: Secondary | ICD-10-CM | POA: Diagnosis not present

## 2020-11-28 DIAGNOSIS — J3081 Allergic rhinitis due to animal (cat) (dog) hair and dander: Secondary | ICD-10-CM | POA: Diagnosis not present

## 2020-11-28 DIAGNOSIS — J301 Allergic rhinitis due to pollen: Secondary | ICD-10-CM | POA: Diagnosis not present

## 2020-11-28 DIAGNOSIS — J3089 Other allergic rhinitis: Secondary | ICD-10-CM | POA: Diagnosis not present

## 2020-12-19 DIAGNOSIS — J3081 Allergic rhinitis due to animal (cat) (dog) hair and dander: Secondary | ICD-10-CM | POA: Diagnosis not present

## 2020-12-19 DIAGNOSIS — J301 Allergic rhinitis due to pollen: Secondary | ICD-10-CM | POA: Diagnosis not present

## 2020-12-19 DIAGNOSIS — J3089 Other allergic rhinitis: Secondary | ICD-10-CM | POA: Diagnosis not present

## 2020-12-20 DIAGNOSIS — F411 Generalized anxiety disorder: Secondary | ICD-10-CM | POA: Diagnosis not present

## 2020-12-26 DIAGNOSIS — J3081 Allergic rhinitis due to animal (cat) (dog) hair and dander: Secondary | ICD-10-CM | POA: Diagnosis not present

## 2020-12-26 DIAGNOSIS — J301 Allergic rhinitis due to pollen: Secondary | ICD-10-CM | POA: Diagnosis not present

## 2020-12-26 DIAGNOSIS — J3089 Other allergic rhinitis: Secondary | ICD-10-CM | POA: Diagnosis not present

## 2021-01-04 DIAGNOSIS — J3081 Allergic rhinitis due to animal (cat) (dog) hair and dander: Secondary | ICD-10-CM | POA: Diagnosis not present

## 2021-01-04 DIAGNOSIS — J3089 Other allergic rhinitis: Secondary | ICD-10-CM | POA: Diagnosis not present

## 2021-01-04 DIAGNOSIS — J301 Allergic rhinitis due to pollen: Secondary | ICD-10-CM | POA: Diagnosis not present

## 2021-01-11 DIAGNOSIS — J3081 Allergic rhinitis due to animal (cat) (dog) hair and dander: Secondary | ICD-10-CM | POA: Diagnosis not present

## 2021-01-11 DIAGNOSIS — J3089 Other allergic rhinitis: Secondary | ICD-10-CM | POA: Diagnosis not present

## 2021-01-11 DIAGNOSIS — J301 Allergic rhinitis due to pollen: Secondary | ICD-10-CM | POA: Diagnosis not present

## 2021-01-18 DIAGNOSIS — J3089 Other allergic rhinitis: Secondary | ICD-10-CM | POA: Diagnosis not present

## 2021-01-18 DIAGNOSIS — J301 Allergic rhinitis due to pollen: Secondary | ICD-10-CM | POA: Diagnosis not present

## 2021-01-18 DIAGNOSIS — J3081 Allergic rhinitis due to animal (cat) (dog) hair and dander: Secondary | ICD-10-CM | POA: Diagnosis not present

## 2021-01-23 DIAGNOSIS — J301 Allergic rhinitis due to pollen: Secondary | ICD-10-CM | POA: Diagnosis not present

## 2021-01-23 DIAGNOSIS — J3089 Other allergic rhinitis: Secondary | ICD-10-CM | POA: Diagnosis not present

## 2021-01-23 DIAGNOSIS — J3081 Allergic rhinitis due to animal (cat) (dog) hair and dander: Secondary | ICD-10-CM | POA: Diagnosis not present

## 2021-02-02 DIAGNOSIS — J3081 Allergic rhinitis due to animal (cat) (dog) hair and dander: Secondary | ICD-10-CM | POA: Diagnosis not present

## 2021-02-02 DIAGNOSIS — J301 Allergic rhinitis due to pollen: Secondary | ICD-10-CM | POA: Diagnosis not present

## 2021-02-02 DIAGNOSIS — F411 Generalized anxiety disorder: Secondary | ICD-10-CM | POA: Diagnosis not present

## 2021-02-05 DIAGNOSIS — J3089 Other allergic rhinitis: Secondary | ICD-10-CM | POA: Diagnosis not present

## 2021-02-07 DIAGNOSIS — Z20822 Contact with and (suspected) exposure to covid-19: Secondary | ICD-10-CM | POA: Diagnosis not present

## 2021-02-07 DIAGNOSIS — Z03818 Encounter for observation for suspected exposure to other biological agents ruled out: Secondary | ICD-10-CM | POA: Diagnosis not present

## 2021-02-08 ENCOUNTER — Ambulatory Visit: Payer: Self-pay | Admitting: *Deleted

## 2021-02-08 NOTE — Telephone Encounter (Signed)
Pt stated she just tested positive for covid yesterday, wanted to know what medications she can take.      Called patient to review covid symptoms and medications. C/o fever 102 with taking advil, c/o headache sore throat, body aches since 02/06/21. Diagnosed with covid yesterday . Took 2 at home test and then tested at site with PCR and positive. Reviewed medications recommended for OTC to treat covid symptoms. Denies chest pain, difficulty breathing . Reports O2 sat at 95% HR 115bpm using pulse oximeter. Patient has been vaccinated x 2 Moderna and booster x 1 Pfizer. Reviewed quarantine guidelines . Patient reports she is going out of the country [place] and would like to have virtual visit scheduled for any medication to be prescribed to treat symptoms of covid. Please advise. Last seen by Dr. Roxan Hockey on 02/22/20. Patient already scheduled as in office visit with Julian Hy, NP 02/13/21.  Reviewed with patient appt will be needed for virtual visit instead. Please advise for cancellation or change to virtual visit. Care advise given. Patient verbalized understanding of care advise and to call back or go to [facility] or ED if symptoms worsen.  Reason for Disposition . [1] [id number] diagnosed by positive lab test (e.g., PCR, rapid self-test kit) AND [2] mild symptoms (e.g., cough, fever, others) AND [1] no complications or SOB  Answer Assessment - Initial Assessment Questions 1. COVID-19 DIAGNOSIS: "Who made your COVID-19 diagnosis?" "Was it confirmed by a positive lab test or self-test?" If not diagnosed by a doctor (or NP/PA), ask "Are there lots of cases (community spread) where you live?" Note: See public health department website, if unsure.     At home test and was tested at site for PCR all test were positive  2. COVID-19 EXPOSURE: "Was there any known exposure to COVID before the symptoms began?" CDC Definition of close contact: within 6 feet (2 meters) for a total of 15 minutes or more over a 24-hour period.       unknown 3. ONSET: "When did the COVID-19 symptoms start?"      02/06/21 4. WORST SYMPTOM: "What is your worst symptom?" (e.g., cough, fever, shortness of breath, muscle aches)     Fever, cough body aches  5. COUGH: "Do you have a cough?" If Yes, ask: "How bad is the cough?"       Yes. Mild  6. FEVER: "Do you have a fever?" If Yes, ask: "What is your temperature, how was it measured, and when did it start?"     Yes . 102 7. RESPIRATORY STATUS: "Describe your breathing?" (e.g., shortness of breath, wheezing, unable to speak)      no 8. BETTER-SAME-WORSE: "Are you getting better, staying the same or getting worse compared to yesterday?"  If getting worse, ask, "In what way?"     worse 9. HIGH RISK DISEASE: "Do you have any chronic medical problems?" (e.g., asthma, heart or lung disease, weak immune system, obesity, etc.)     No  10. VACCINE: "Have you had the COVID-19 vaccine?" If Yes, ask: "Which one, how many shots, when did you get it?"       Yes 2 vaccinations Moderna 11. BOOSTER: "Have you received your COVID-19 booster?" If Yes, ask: "Which one and when did you get it?"       1 booster shot Pfizer  12. PREGNANCY: "Is there any chance you are pregnant?" "When was your last menstrual period?"       na 13. OTHER SYMPTOMS: "Do you have  any other symptoms?"  (e.g., chills, fatigue, headache, loss of smell or taste, muscle pain, sore throat)       Headache, sore throat, body aches, fever, hoarseness 14. O2 SATURATION MONITOR:  "Do you use an oxygen saturation monitor (pulse oximeter) at home?" If Yes, ask "What is your reading (oxygen level) today?" "What is your usual oxygen saturation reading?" (e.g., 95%)       O 2 sat 95% HR 115bpm  Protocols used: CORONAVIRUS (COVID-19) DIAGNOSED OR SUSPECTED-A-AH

## 2021-02-08 NOTE — Telephone Encounter (Signed)
FYI

## 2021-02-08 NOTE — Telephone Encounter (Signed)
Pt has a virtual appt scheduled

## 2021-02-09 ENCOUNTER — Telehealth (INDEPENDENT_AMBULATORY_CARE_PROVIDER_SITE_OTHER): Payer: BC Managed Care – PPO | Admitting: Family Medicine

## 2021-02-09 ENCOUNTER — Encounter: Payer: Self-pay | Admitting: Family Medicine

## 2021-02-09 DIAGNOSIS — U071 COVID-19: Secondary | ICD-10-CM | POA: Diagnosis not present

## 2021-02-09 MED ORDER — PAXLOVID 20 X 150 MG & 10 X 100MG PO TBPK: 3.0000 | ORAL_TABLET | Freq: Two times a day (BID) | ORAL | 0 refills | Status: DC

## 2021-02-09 NOTE — Progress Notes (Signed)
Name: Veronica Gibson   MRN: [id number]    DOB: December 01, 1975   Date:02/09/2021       Progress Note  Subjective  Chief Complaint  COVID Positive  I connected with  Veronica Gibson  on 02/09/21 at  9:20 AM EDT by telephone and verified that I am speaking with the correct person using two identifiers.  I discussed the limitations of evaluation and management by telemedicine and the availability of in person appointments. The patient expressed understanding and agreed to proceed with the virtual visit  Staff also discussed with the patient that there may be a patient responsible charge related to this service. Patient Location: at home  Provider Location: [facility]  Additional Individuals present: alone   HPI  COVID-19: she tested Wednesday am because she felt tired, had mild headache and voice was different. She went to a wedding on Sunday May 15 th and three other people tested positive. It was an outdoor wedding. She states yesterday she felt bad but today she is feeling a little better. She had night sweats. She has lack of appetite. She has body aches but not as severe today, hoarseness also improved since compared to yesterday.   BP 121/78, pulse ox 94-98% , temperature has gone as high as 102   Her children are with her but asymptomatic   She asked about therapy, explained she is still within the therapeutic window, but does not have major risk factors. Advised to continue hydration, fluids, monitoring pulse ox, and if not improving by tomorrow she can start medication, otherwise only symptom control   She had covid-19 vaccines   Patient Active Problem List   Diagnosis Date Noted  . Arthralgia of right lower leg 09/06/2020  . Increased bilirubin level 09/06/2020  . Leukopenia 09/06/2020  . Herpes simplex vulvovaginitis 08/29/2020  . ASCUS of cervix with negative high risk HPV 08/29/2020  . Recurrent UTI 02/23/2020  . H/O anxiety disorder 02/23/2020  . Allergies 02/23/2020  .  Achilles rupture, right 08/10/2017  . Metatarsalgia of both feet 09/29/2013  . Patellar tendinitis 09/29/2013  . Abnormality of gait 03/24/2013  . 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 TENDINITIS, TIBIALIS 04/03/2007  . KNEE PAIN, RIGHT 03/13/2007    Past Surgical History:  Procedure Laterality Date  . ACHILLES TENDON REPAIR  08/18/2017  . WISDOM TOOTH EXTRACTION      Family History  Problem Relation Age of Onset  . Cancer Paternal Grandmother 67       UTERINE  . Coronary artery disease Paternal Grandfather        CABG  . Cancer Paternal Aunt        uterine  . Cancer Other        uterine     Current Outpatient Medications:  .  azelastine (ASTELIN) 0.1 % nasal spray, SMARTSIG:1-2 Spray(s) Both Nares Twice Daily, Disp: , Rfl:  .  fexofenadine (ALLEGRA) 180 MG tablet, , Disp: , Rfl:  .  fluticasone (FLONASE) 50 MCG/ACT nasal spray, PLACE 1-2 SPRAY IN EACH NOSTRIL ONCE A DAY, Disp: , Rfl: 3 .  loratadine (CLARITIN) 10 MG tablet, Take 10 mg by mouth 2 (two) times daily. , Disp: , Rfl:  .  nitrofurantoin, macrocrystal-monohydrate, (MACROBID) 100 MG capsule, TAKE 1 CAPSULE BY MOUTH EVERY DAY AFTER SEX AS PREVENTATIVE, Disp: 30 capsule, Rfl: 2 .  spironolactone (ALDACTONE) 50 MG tablet, Take 50 mg by mouth 2 (two) times daily., Disp: , Rfl:  .  valACYclovir (VALTREX) 500 MG  tablet, Take 1 tablet (500 mg total) by mouth daily., Disp: 90 tablet, Rfl: 3 .  levonorgestrel (MIRENA, 52 MG,) 20 MCG/24HR IUD, 1 Intra Uterine Device (1 each total) by Intrauterine route once for 1 dose., Disp: 1 Intra Uterine Device, Rfl: 0  Allergies  Allergen Reactions  . Dust Mite Mixed Allergen Ext [Mite (D. Farinae)] Other (See Comments)    And mold  . Other     Other reaction(s): Other (See Comments) Trees, dust  . Pollen Extract     Other reaction(s): Unknown  . Shellfish Allergy     I personally reviewed active problem list, medication list, allergies, family history, social history,  health maintenance with the patient/caregiver today.   ROS  Ten systems reviewed and is negative except as mentioned in HPI   Objective  Virtual encounter, vitals not obtained.  There is no height or weight on file to calculate BMI.  Physical Exam  Awake, alert and oriented   PHQ2/9: Depression screen [facility] 2/9 02/09/2021 09/07/2020 02/23/2020  Decreased Interest 0 0 0  Down, Depressed, Hopeless 0 0 0  PHQ - 2 Score 0 0 0  Altered sleeping - - 1  Tired, decreased energy - - 0  Change in appetite - - 0  Feeling bad or failure about yourself  - - 0  Trouble concentrating - - 0  Moving slowly or fidgety/restless - - 0  Suicidal thoughts - - 0  PHQ-9 Score - - 1  Difficult doing work/chores - - Not difficult at all   PHQ-2/9 Result is negative.    Fall Risk: Fall Risk  02/09/2021 09/07/2020 02/23/2020  Falls in the past year? 0 0 0  Number falls in past yr: 0 0 0  Injury with Fall? 0 0 0  Follow up - Falls evaluation completed -     Assessment & Plan   1. COVID-19  - Nirmatrelvir & Ritonavir (PAXLOVID) 20 x 150 MG & 10 x 100MG  TBPK; Take 3 tablets by mouth in the morning and at bedtime.  Dispense: 30 each; Refill: 0  I discussed the assessment and treatment plan with the patient. The patient was provided an opportunity to ask questions and all were answered. The patient agreed with the plan and demonstrated an understanding of the instructions.  The patient was advised to call back or seek an in-person evaluation if the symptoms worsen or if the condition fails to improve as anticipated.  I provided 15 minutes of non-face-to-face time during this encounter.

## 2021-02-13 ENCOUNTER — Other Ambulatory Visit: Payer: Self-pay

## 2021-02-13 ENCOUNTER — Telehealth (INDEPENDENT_AMBULATORY_CARE_PROVIDER_SITE_OTHER): Payer: BC Managed Care – PPO | Admitting: Unknown Physician Specialty

## 2021-02-13 ENCOUNTER — Encounter: Payer: Self-pay | Admitting: Unknown Physician Specialty

## 2021-02-13 VITALS — BP 101/79 | HR 78 | Temp 96.9°F

## 2021-02-13 DIAGNOSIS — U071 COVID-19: Secondary | ICD-10-CM

## 2021-02-13 NOTE — Progress Notes (Signed)
BP 101/79   Pulse 78   Temp (!) 96.9 F (36.1 C) (Oral)   SpO2 95%    Subjective:    Patient ID: Veronica Gibson, female    DOB: 1976-06-07, 45 y.o.   MRN: [id number]  HPI: Veronica Gibson is a 45 y.o. female  Chief Complaint  Patient presents with  . Covid Positive    On 5/18.   . Consult    Patient is to go out of town to [place] on 5/27   Diagnosed with Covid last week.  Symptoms onset 7 days ago.  Never picked up Paxlovid.  Her symptoms of fever, runny nose, cough have resolved and now just has fatigue.  She is going to [place] and needs a letter in case she tests positive.    Relevant past medical, surgical, family and social history reviewed and updated as indicated. Interim medical history since our last visit reviewed. Allergies and medications reviewed and updated.  Review of Systems  Per HPI unless specifically indicated above     Objective:    BP 101/79   Pulse 78   Temp (!) 96.9 F (36.1 C) (Oral)   SpO2 95%   Wt Readings from Last 3 Encounters:  09/07/20 120 lb 4.8 oz (54.6 kg)  08/29/20 121 lb (54.9 kg)  05/18/20 122 lb (55.3 kg)    Physical Exam Constitutional:      General: She is not in acute distress.    Appearance: Normal appearance. She is well-developed.  HENT:     Head: Normocephalic and atraumatic.     Nose: No rhinorrhea.     Right Sinus: No maxillary sinus tenderness or frontal sinus tenderness.     Left Sinus: No maxillary sinus tenderness or frontal sinus tenderness.     Mouth/Throat:     Mouth: Mucous membranes are moist.  Eyes:     General: Lids are normal. No scleral icterus.       Right eye: No discharge.        Left eye: No discharge.     Conjunctiva/sclera: Conjunctivae normal.  Pulmonary:     Effort: Pulmonary effort is normal. No respiratory distress.  Abdominal:     Palpations: There is no hepatomegaly or splenomegaly.  Musculoskeletal:        General: Normal range of motion.  Skin:    Coloration: Skin is not  pale.     Findings: No erythema or rash.  Neurological:     Mental Status: She is alert and oriented to person, place, and time.  Psychiatric:        Behavior: Behavior normal.        Thought Content: Thought content normal.        Judgment: Judgment normal.     Results for orders placed or performed in visit on 09/07/20  CBC with Differential/Platelet  Result Value Ref Range   WBC 3.1 (L) 3.8 - 10.8 Thousand/uL   RBC 3.85 3.80 - 5.10 Million/uL   Hemoglobin 12.1 11.7 - 15.5 g/dL   HCT 36.3 35.0 - 45.0 %   MCV 94.3 80.0 - 100.0 fL   MCH 31.4 27.0 - 33.0 pg   MCHC 33.3 32.0 - 36.0 g/dL   RDW 11.9 11.0 - 15.0 %   Platelets 185 140 - 400 Thousand/uL   MPV 10.5 7.5 - 12.5 fL   Neutro Abs 1,243 (L) 1,500 - 7,800 cells/uL   Lymphs Abs 1,442 850 - 3,900 cells/uL   Absolute Monocytes 288  200 - 950 cells/uL   Eosinophils Absolute 109 15 - 500 cells/uL   Basophils Absolute 19 0 - 200 cells/uL   Neutrophils Relative % 40.1 %   Total Lymphocyte 46.5 %   Monocytes Relative 9.3 %   Eosinophils Relative 3.5 %   Basophils Relative 0.6 %  COMPLETE METABOLIC PANEL WITH GFR  Result Value Ref Range   Glucose, Bld 78 65 - 99 mg/dL   BUN 17 7 - 25 mg/dL   Creat 0.65 0.50 - 1.10 mg/dL   GFR, Est Non African American 109 > OR = 60 mL/min/1.35m2   GFR, Est African American 126 > OR = 60 mL/min/1.12m2   BUN/Creatinine Ratio NOT APPLICABLE 6 - 22 (calc)   Sodium 138 135 - 146 mmol/L   Potassium 3.7 3.5 - 5.3 mmol/L   Chloride 106 98 - 110 mmol/L   CO2 25 20 - 32 mmol/L   Calcium 9.1 8.6 - 10.2 mg/dL   Total Protein 5.8 (L) 6.1 - 8.1 g/dL   Albumin 4.2 3.6 - 5.1 g/dL   Globulin 1.6 (L) 1.9 - 3.7 g/dL (calc)   AG Ratio 2.6 (H) 1.0 - 2.5 (calc)   Total Bilirubin 1.4 (H) 0.2 - 1.2 mg/dL   Alkaline phosphatase (APISO) 30 (L) 31 - 125 U/L   AST 18 10 - 30 U/L   ALT 17 6 - 29 U/L      Assessment & Plan:   Problem List Items Addressed This Visit   None   Visit Diagnoses    COVID-19    -   Primary   Pt is 7 days post Covid sx onset. Her symptoms have all resolved except for mild tatigue.  Letter written that she has completed her mandatory quarantine.        Follow up plan: Return if symptoms worsen or fail to improve.

## 2021-02-22 ENCOUNTER — Ambulatory Visit: Payer: BC Managed Care – PPO | Admitting: Internal Medicine

## 2021-02-26 ENCOUNTER — Other Ambulatory Visit: Payer: Self-pay

## 2021-02-26 ENCOUNTER — Encounter: Payer: Self-pay | Admitting: Unknown Physician Specialty

## 2021-02-26 ENCOUNTER — Ambulatory Visit (INDEPENDENT_AMBULATORY_CARE_PROVIDER_SITE_OTHER): Payer: BC Managed Care – PPO | Admitting: Unknown Physician Specialty

## 2021-02-26 VITALS — BP 108/62 | HR 72 | Temp 98.4°F | Resp 16 | Ht 62.0 in | Wt 120.7 lb

## 2021-02-26 DIAGNOSIS — D72819 Decreased white blood cell count, unspecified: Secondary | ICD-10-CM | POA: Diagnosis not present

## 2021-02-26 DIAGNOSIS — Z5181 Encounter for therapeutic drug level monitoring: Secondary | ICD-10-CM

## 2021-02-26 DIAGNOSIS — Z0000 Encounter for general adult medical examination without abnormal findings: Secondary | ICD-10-CM | POA: Diagnosis not present

## 2021-02-26 DIAGNOSIS — R17 Unspecified jaundice: Secondary | ICD-10-CM

## 2021-02-26 NOTE — Progress Notes (Signed)
BP 108/62   Pulse 72   Temp 98.4 F (36.9 C)   Resp 16   Ht 5\' 2"  (1.575 m)   Wt 120 lb 11.2 oz (54.7 kg)   SpO2 98%   BMI 22.08 kg/m    Subjective:    Patient ID: Veronica Gibson, female    DOB: [birth year], 45 y.o.   MRN: [id number]  HPI: Veronica Gibson is a 45 y.o. female  Chief Complaint  Patient presents with  . Annual Exam    Pap and mammo through GYN   Depression screen [facility] 2/9 02/26/2021 02/13/2021 02/09/2021 09/07/2020 02/23/2020  Decreased Interest 0 0 0 0 0  Down, Depressed, Hopeless 0 0 0 0 0  PHQ - 2 Score 0 0 0 0 0  Altered sleeping 0 - - - 1  Tired, decreased energy 0 - - - 0  Change in appetite 0 - - - 0  Feeling bad or failure about yourself  0 - - - 0  Trouble concentrating 0 - - - 0  Moving slowly or fidgety/restless 0 - - - 0  Suicidal thoughts 0 - - - 0  PHQ-9 Score 0 - - - 1  Difficult doing work/chores Not difficult at all - - - Not difficult at all   Social History   Socioeconomic History  . Marital status: Divorced    Spouse name: Not on file  . Number of children: 2  . Years of education: 16  . Highest education level: Not on file  Occupational History  . Occupation: Designer, jewellery: [employer]  Tobacco Use  . Smoking status: Never Smoker  . Smokeless tobacco: Never Used  Vaping Use  . Vaping Use: Never used  Substance and Sexual Activity  . Alcohol use: Yes    Comment: OCC  . Drug use: No  . Sexual activity: Yes    Birth control/protection: I.U.D.    Comment: Mirena  Other Topics Concern  . Not on file  Social History Narrative  . Not on file   Social Determinants of Health   Financial Resource Strain: Not on file  Food Insecurity: Not on file  Transportation Needs: Not on file  Physical Activity: Not on file  Stress: Not on file  Social Connections: Not on file  Intimate Partner Violence: Not on file   Family History  Problem Relation Age of Onset  . Cancer Paternal Grandmother 51       UTERINE  .  Coronary artery disease Paternal Grandfather        CABG  . Cancer Paternal Aunt        uterine  . Cancer Other        uterine   Past Medical History:  Diagnosis Date  . Anxiety   . Genital herpes 03/2020   confirmed by HSV 2 IgG  . Head ache   . History of mammogram 12/13/2013   CAT 2  . History of Papanicolaou smear of cervix 04/07/2014   -/-  . Rupture Achilles tendon 08/14/2017   Right  . Seasonal allergies   . Vaccine for human papilloma virus (HPV) types 6, 11, 16, and 18 administered    Past Surgical History:  Procedure Laterality Date  . ACHILLES TENDON REPAIR  08/18/2017  . WISDOM TOOTH EXTRACTION      Relevant past medical, surgical, family and social history reviewed and updated as indicated. Interim medical history since our last visit reviewed. Allergies and medications reviewed  and updated.  Review of Systems  Constitutional: Negative.   HENT: Negative.   Eyes: Negative.   Respiratory: Negative.   Cardiovascular: Negative.   Gastrointestinal: Negative.   Endocrine: Negative.   Genitourinary: Negative.   Musculoskeletal: Negative.   Skin: Negative.   Allergic/Immunologic: Negative.   Neurological: Negative.   Hematological: Negative.   Psychiatric/Behavioral: Negative.     Per HPI unless specifically indicated above     Objective:    BP 108/62   Pulse 72   Temp 98.4 F (36.9 C)   Resp 16   Ht 5\' 2"  (1.575 m)   Wt 120 lb 11.2 oz (54.7 kg)   SpO2 98%   BMI 22.08 kg/m   Wt Readings from Last 3 Encounters:  02/26/21 120 lb 11.2 oz (54.7 kg)  09/07/20 120 lb 4.8 oz (54.6 kg)  08/29/20 121 lb (54.9 kg)    Physical Exam Constitutional:      General: She is not in acute distress.    Appearance: Normal appearance. She is well-developed.  HENT:     Head: Normocephalic and atraumatic.  Eyes:     General: Lids are normal. No scleral icterus.       Right eye: No discharge.        Left eye: No discharge.     Conjunctiva/sclera: Conjunctivae  normal.  Neck:     Vascular: No carotid bruit or JVD.  Cardiovascular:     Rate and Rhythm: Normal rate and regular rhythm.     Heart sounds: Normal heart sounds.  Pulmonary:     Effort: Pulmonary effort is normal.     Breath sounds: Normal breath sounds.  Abdominal:     Palpations: There is no hepatomegaly or splenomegaly.  Musculoskeletal:        General: Normal range of motion.     Cervical back: Normal range of motion and neck supple.  Skin:    General: Skin is warm and dry.     Coloration: Skin is not pale.     Findings: No rash.  Neurological:     Mental Status: She is alert and oriented to person, place, and time.  Psychiatric:        Behavior: Behavior normal.        Thought Content: Thought content normal.        Judgment: Judgment normal.       Assessment & Plan:   Problem List Items Addressed This Visit      Unprioritized   Increased bilirubin level    Recheck today      Leukopenia    Recheck CBC today       Other Visit Diagnoses    Routine general medical examination at a health care facility    -  Primary   Relevant Orders   CBC with Differential/Platelet   Comprehensive metabolic panel   TSH   Lipid panel   Medication monitoring encounter       Taking Spironalactone.  Check blood chemistries      HM items are up to date Due top see gyn for breast and pelvic exam  Follow up plan: Return if symptoms worsen or fail to improve.

## 2021-02-26 NOTE — Assessment & Plan Note (Signed)
Recheck today. 

## 2021-02-26 NOTE — Assessment & Plan Note (Signed)
Recheck CBC today. 

## 2021-02-27 ENCOUNTER — Other Ambulatory Visit: Payer: Self-pay | Admitting: Unknown Physician Specialty

## 2021-02-27 ENCOUNTER — Encounter: Payer: BC Managed Care – PPO | Admitting: Family Medicine

## 2021-02-27 ENCOUNTER — Encounter: Payer: Self-pay | Admitting: Unknown Physician Specialty

## 2021-02-27 DIAGNOSIS — R17 Unspecified jaundice: Secondary | ICD-10-CM

## 2021-02-27 LAB — CBC WITH DIFFERENTIAL/PLATELET
Absolute Monocytes: 505 cells/uL (ref 200–950)
Basophils Absolute: 20 cells/uL (ref 0–200)
Basophils Relative: 0.4 %
Eosinophils Absolute: 50 cells/uL (ref 15–500)
Eosinophils Relative: 1 %
HCT: 37.6 % (ref 35.0–45.0)
Hemoglobin: 12.8 g/dL (ref 11.7–15.5)
Lymphs Abs: 1675 cells/uL (ref 850–3900)
MCH: 32.6 pg (ref 27.0–33.0)
MCHC: 34 g/dL (ref 32.0–36.0)
MCV: 95.7 fL (ref 80.0–100.0)
MPV: 9.9 fL (ref 7.5–12.5)
Monocytes Relative: 10.1 %
Neutro Abs: 2750 cells/uL (ref 1500–7800)
Neutrophils Relative %: 55 %
Platelets: 231 10*3/uL (ref 140–400)
RBC: 3.93 10*6/uL (ref 3.80–5.10)
RDW: 12.1 % (ref 11.0–15.0)
Total Lymphocyte: 33.5 %
WBC: 5 10*3/uL (ref 3.8–10.8)

## 2021-02-27 LAB — LIPID PANEL
Cholesterol: 178 mg/dL (ref <200)
HDL: 63 mg/dL (ref >=50)
LDL Cholesterol (Calc): 104 mg/dL (calc) — ABNORMAL HIGH
Non-HDL Cholesterol (Calc): 115 mg/dL (calc) (ref <130)
Total CHOL/HDL Ratio: 2.8 (calc) (ref <5.0)
Triglycerides: 37 mg/dL (ref <150)

## 2021-02-27 LAB — COMPREHENSIVE METABOLIC PANEL
AG Ratio: 2.2 (calc) (ref 1.0–2.5)
ALT: 17 U/L (ref 6–29)
AST: 20 U/L (ref 10–30)
Albumin: 4.7 g/dL (ref 3.6–5.1)
Alkaline phosphatase (APISO): 36 U/L (ref 31–125)
BUN: 11 mg/dL (ref 7–25)
CO2: 26 mmol/L (ref 20–32)
Calcium: 10.1 mg/dL (ref 8.6–10.2)
Chloride: 103 mmol/L (ref 98–110)
Creat: 0.77 mg/dL (ref 0.50–1.10)
Globulin: 2.1 g/dL (calc) (ref 1.9–3.7)
Glucose, Bld: 75 mg/dL (ref 65–99)
Potassium: 5.2 mmol/L (ref 3.5–5.3)
Sodium: 138 mmol/L (ref 135–146)
Total Bilirubin: 2.8 mg/dL — ABNORMAL HIGH (ref 0.2–1.2)
Total Protein: 6.8 g/dL (ref 6.1–8.1)

## 2021-02-28 ENCOUNTER — Other Ambulatory Visit: Payer: Self-pay

## 2021-02-28 DIAGNOSIS — R17 Unspecified jaundice: Secondary | ICD-10-CM

## 2021-03-01 DIAGNOSIS — J3089 Other allergic rhinitis: Secondary | ICD-10-CM | POA: Diagnosis not present

## 2021-03-01 DIAGNOSIS — J301 Allergic rhinitis due to pollen: Secondary | ICD-10-CM | POA: Diagnosis not present

## 2021-03-01 DIAGNOSIS — J3081 Allergic rhinitis due to animal (cat) (dog) hair and dander: Secondary | ICD-10-CM | POA: Diagnosis not present

## 2021-03-01 LAB — BILIRUBIN, FRACTIONATED(TOT/DIR/INDIR)
Bilirubin, Direct: 0.3 mg/dL — ABNORMAL HIGH (ref 0.0–0.2)
Indirect Bilirubin: 1.4 mg/dL (calc) — ABNORMAL HIGH (ref 0.2–1.2)
Total Bilirubin: 1.7 mg/dL — ABNORMAL HIGH (ref 0.2–1.2)

## 2021-03-06 DIAGNOSIS — J3089 Other allergic rhinitis: Secondary | ICD-10-CM | POA: Diagnosis not present

## 2021-03-06 DIAGNOSIS — J301 Allergic rhinitis due to pollen: Secondary | ICD-10-CM | POA: Diagnosis not present

## 2021-03-07 DIAGNOSIS — F411 Generalized anxiety disorder: Secondary | ICD-10-CM | POA: Diagnosis not present

## 2021-03-07 DIAGNOSIS — F331 Major depressive disorder, recurrent, moderate: Secondary | ICD-10-CM | POA: Diagnosis not present

## 2021-03-08 DIAGNOSIS — J301 Allergic rhinitis due to pollen: Secondary | ICD-10-CM | POA: Diagnosis not present

## 2021-03-08 DIAGNOSIS — J3089 Other allergic rhinitis: Secondary | ICD-10-CM | POA: Diagnosis not present

## 2021-03-08 DIAGNOSIS — J3081 Allergic rhinitis due to animal (cat) (dog) hair and dander: Secondary | ICD-10-CM | POA: Diagnosis not present

## 2021-03-09 DIAGNOSIS — F411 Generalized anxiety disorder: Secondary | ICD-10-CM | POA: Diagnosis not present

## 2021-03-15 DIAGNOSIS — J3089 Other allergic rhinitis: Secondary | ICD-10-CM | POA: Diagnosis not present

## 2021-03-15 DIAGNOSIS — J301 Allergic rhinitis due to pollen: Secondary | ICD-10-CM | POA: Diagnosis not present

## 2021-03-15 DIAGNOSIS — F411 Generalized anxiety disorder: Secondary | ICD-10-CM | POA: Diagnosis not present

## 2021-03-20 DIAGNOSIS — J3081 Allergic rhinitis due to animal (cat) (dog) hair and dander: Secondary | ICD-10-CM | POA: Diagnosis not present

## 2021-03-20 DIAGNOSIS — J3089 Other allergic rhinitis: Secondary | ICD-10-CM | POA: Diagnosis not present

## 2021-03-20 DIAGNOSIS — J301 Allergic rhinitis due to pollen: Secondary | ICD-10-CM | POA: Diagnosis not present

## 2021-03-30 DIAGNOSIS — J3089 Other allergic rhinitis: Secondary | ICD-10-CM | POA: Diagnosis not present

## 2021-03-30 DIAGNOSIS — J3081 Allergic rhinitis due to animal (cat) (dog) hair and dander: Secondary | ICD-10-CM | POA: Diagnosis not present

## 2021-03-30 DIAGNOSIS — J301 Allergic rhinitis due to pollen: Secondary | ICD-10-CM | POA: Diagnosis not present

## 2021-04-03 DIAGNOSIS — J3089 Other allergic rhinitis: Secondary | ICD-10-CM | POA: Diagnosis not present

## 2021-04-03 DIAGNOSIS — J301 Allergic rhinitis due to pollen: Secondary | ICD-10-CM | POA: Diagnosis not present

## 2021-04-03 DIAGNOSIS — J3081 Allergic rhinitis due to animal (cat) (dog) hair and dander: Secondary | ICD-10-CM | POA: Diagnosis not present

## 2021-04-05 DIAGNOSIS — J3089 Other allergic rhinitis: Secondary | ICD-10-CM | POA: Diagnosis not present

## 2021-04-05 DIAGNOSIS — J3081 Allergic rhinitis due to animal (cat) (dog) hair and dander: Secondary | ICD-10-CM | POA: Diagnosis not present

## 2021-04-05 DIAGNOSIS — J301 Allergic rhinitis due to pollen: Secondary | ICD-10-CM | POA: Diagnosis not present

## 2021-04-10 DIAGNOSIS — J301 Allergic rhinitis due to pollen: Secondary | ICD-10-CM | POA: Diagnosis not present

## 2021-04-10 DIAGNOSIS — J3089 Other allergic rhinitis: Secondary | ICD-10-CM | POA: Diagnosis not present

## 2021-04-12 DIAGNOSIS — F411 Generalized anxiety disorder: Secondary | ICD-10-CM | POA: Diagnosis not present

## 2021-04-13 DIAGNOSIS — J301 Allergic rhinitis due to pollen: Secondary | ICD-10-CM | POA: Diagnosis not present

## 2021-04-13 DIAGNOSIS — J3089 Other allergic rhinitis: Secondary | ICD-10-CM | POA: Diagnosis not present

## 2021-04-17 DIAGNOSIS — J3089 Other allergic rhinitis: Secondary | ICD-10-CM | POA: Diagnosis not present

## 2021-04-17 DIAGNOSIS — J301 Allergic rhinitis due to pollen: Secondary | ICD-10-CM | POA: Diagnosis not present

## 2021-04-24 DIAGNOSIS — J3081 Allergic rhinitis due to animal (cat) (dog) hair and dander: Secondary | ICD-10-CM | POA: Diagnosis not present

## 2021-04-24 DIAGNOSIS — J3089 Other allergic rhinitis: Secondary | ICD-10-CM | POA: Diagnosis not present

## 2021-04-24 DIAGNOSIS — J301 Allergic rhinitis due to pollen: Secondary | ICD-10-CM | POA: Diagnosis not present

## 2021-05-01 DIAGNOSIS — J3089 Other allergic rhinitis: Secondary | ICD-10-CM | POA: Diagnosis not present

## 2021-05-01 DIAGNOSIS — J301 Allergic rhinitis due to pollen: Secondary | ICD-10-CM | POA: Diagnosis not present

## 2021-05-08 DIAGNOSIS — J301 Allergic rhinitis due to pollen: Secondary | ICD-10-CM | POA: Diagnosis not present

## 2021-05-08 DIAGNOSIS — J3089 Other allergic rhinitis: Secondary | ICD-10-CM | POA: Diagnosis not present

## 2021-05-09 DIAGNOSIS — F411 Generalized anxiety disorder: Secondary | ICD-10-CM | POA: Diagnosis not present

## 2021-05-15 DIAGNOSIS — J301 Allergic rhinitis due to pollen: Secondary | ICD-10-CM | POA: Diagnosis not present

## 2021-05-15 DIAGNOSIS — J3081 Allergic rhinitis due to animal (cat) (dog) hair and dander: Secondary | ICD-10-CM | POA: Diagnosis not present

## 2021-05-15 DIAGNOSIS — J3089 Other allergic rhinitis: Secondary | ICD-10-CM | POA: Diagnosis not present

## 2021-05-24 DIAGNOSIS — J301 Allergic rhinitis due to pollen: Secondary | ICD-10-CM | POA: Diagnosis not present

## 2021-05-24 DIAGNOSIS — J3081 Allergic rhinitis due to animal (cat) (dog) hair and dander: Secondary | ICD-10-CM | POA: Diagnosis not present

## 2021-05-24 DIAGNOSIS — J3089 Other allergic rhinitis: Secondary | ICD-10-CM | POA: Diagnosis not present

## 2021-05-26 ENCOUNTER — Encounter: Payer: Self-pay | Admitting: Obstetrics and Gynecology

## 2021-05-29 ENCOUNTER — Other Ambulatory Visit: Payer: Self-pay | Admitting: Obstetrics and Gynecology

## 2021-05-29 DIAGNOSIS — J3089 Other allergic rhinitis: Secondary | ICD-10-CM | POA: Diagnosis not present

## 2021-05-29 DIAGNOSIS — N390 Urinary tract infection, site not specified: Secondary | ICD-10-CM

## 2021-05-29 DIAGNOSIS — J301 Allergic rhinitis due to pollen: Secondary | ICD-10-CM | POA: Diagnosis not present

## 2021-05-29 DIAGNOSIS — J3081 Allergic rhinitis due to animal (cat) (dog) hair and dander: Secondary | ICD-10-CM | POA: Diagnosis not present

## 2021-05-29 MED ORDER — NITROFURANTOIN MONOHYD MACRO 100 MG PO CAPS: ORAL_CAPSULE | ORAL | 2 refills | Status: DC

## 2021-05-29 NOTE — Progress Notes (Signed)
Rx RF macrobid to take PC.

## 2021-06-14 DIAGNOSIS — J301 Allergic rhinitis due to pollen: Secondary | ICD-10-CM | POA: Diagnosis not present

## 2021-06-14 DIAGNOSIS — J3089 Other allergic rhinitis: Secondary | ICD-10-CM | POA: Diagnosis not present

## 2021-06-14 DIAGNOSIS — J3081 Allergic rhinitis due to animal (cat) (dog) hair and dander: Secondary | ICD-10-CM | POA: Diagnosis not present

## 2021-06-14 NOTE — Progress Notes (Signed)
Left vm

## 2021-06-14 NOTE — Progress Notes (Signed)
Left vm for pt

## 2021-06-19 DIAGNOSIS — J3089 Other allergic rhinitis: Secondary | ICD-10-CM | POA: Diagnosis not present

## 2021-06-19 DIAGNOSIS — J3081 Allergic rhinitis due to animal (cat) (dog) hair and dander: Secondary | ICD-10-CM | POA: Diagnosis not present

## 2021-06-19 DIAGNOSIS — J301 Allergic rhinitis due to pollen: Secondary | ICD-10-CM | POA: Diagnosis not present

## 2021-06-20 ENCOUNTER — Ambulatory Visit: Payer: BC Managed Care – PPO

## 2021-06-20 ENCOUNTER — Other Ambulatory Visit: Payer: Self-pay

## 2021-06-20 DIAGNOSIS — F411 Generalized anxiety disorder: Secondary | ICD-10-CM | POA: Diagnosis not present

## 2021-06-20 DIAGNOSIS — Z23 Encounter for immunization: Secondary | ICD-10-CM

## 2021-06-28 DIAGNOSIS — J301 Allergic rhinitis due to pollen: Secondary | ICD-10-CM | POA: Diagnosis not present

## 2021-06-28 DIAGNOSIS — J3081 Allergic rhinitis due to animal (cat) (dog) hair and dander: Secondary | ICD-10-CM | POA: Diagnosis not present

## 2021-06-28 DIAGNOSIS — J3089 Other allergic rhinitis: Secondary | ICD-10-CM | POA: Diagnosis not present

## 2021-07-04 DIAGNOSIS — J301 Allergic rhinitis due to pollen: Secondary | ICD-10-CM | POA: Diagnosis not present

## 2021-07-04 DIAGNOSIS — J3081 Allergic rhinitis due to animal (cat) (dog) hair and dander: Secondary | ICD-10-CM | POA: Diagnosis not present

## 2021-07-05 DIAGNOSIS — J3081 Allergic rhinitis due to animal (cat) (dog) hair and dander: Secondary | ICD-10-CM | POA: Diagnosis not present

## 2021-07-05 DIAGNOSIS — J301 Allergic rhinitis due to pollen: Secondary | ICD-10-CM | POA: Diagnosis not present

## 2021-07-05 DIAGNOSIS — J3089 Other allergic rhinitis: Secondary | ICD-10-CM | POA: Diagnosis not present

## 2021-07-17 DIAGNOSIS — J3081 Allergic rhinitis due to animal (cat) (dog) hair and dander: Secondary | ICD-10-CM | POA: Diagnosis not present

## 2021-07-17 DIAGNOSIS — J3089 Other allergic rhinitis: Secondary | ICD-10-CM | POA: Diagnosis not present

## 2021-07-17 DIAGNOSIS — J301 Allergic rhinitis due to pollen: Secondary | ICD-10-CM | POA: Diagnosis not present

## 2021-07-27 DIAGNOSIS — F411 Generalized anxiety disorder: Secondary | ICD-10-CM | POA: Diagnosis not present

## 2021-08-02 ENCOUNTER — Other Ambulatory Visit: Payer: Self-pay | Admitting: Obstetrics and Gynecology

## 2021-08-02 DIAGNOSIS — J3081 Allergic rhinitis due to animal (cat) (dog) hair and dander: Secondary | ICD-10-CM | POA: Diagnosis not present

## 2021-08-02 DIAGNOSIS — A6004 Herpesviral vulvovaginitis: Secondary | ICD-10-CM

## 2021-08-02 DIAGNOSIS — J301 Allergic rhinitis due to pollen: Secondary | ICD-10-CM | POA: Diagnosis not present

## 2021-08-02 DIAGNOSIS — J3089 Other allergic rhinitis: Secondary | ICD-10-CM | POA: Diagnosis not present

## 2021-08-07 DIAGNOSIS — J3081 Allergic rhinitis due to animal (cat) (dog) hair and dander: Secondary | ICD-10-CM | POA: Diagnosis not present

## 2021-08-07 DIAGNOSIS — J3089 Other allergic rhinitis: Secondary | ICD-10-CM | POA: Diagnosis not present

## 2021-08-07 DIAGNOSIS — J301 Allergic rhinitis due to pollen: Secondary | ICD-10-CM | POA: Diagnosis not present

## 2021-08-08 ENCOUNTER — Ambulatory Visit: Payer: BC Managed Care – PPO | Admitting: Obstetrics and Gynecology

## 2021-08-08 ENCOUNTER — Encounter: Payer: Self-pay | Admitting: Obstetrics and Gynecology

## 2021-08-08 ENCOUNTER — Other Ambulatory Visit: Payer: Self-pay

## 2021-08-08 VITALS — BP 98/56 | Ht 66.0 in | Wt 124.0 lb

## 2021-08-08 DIAGNOSIS — N898 Other specified noninflammatory disorders of vagina: Secondary | ICD-10-CM | POA: Diagnosis not present

## 2021-08-08 DIAGNOSIS — R350 Frequency of micturition: Secondary | ICD-10-CM

## 2021-08-08 LAB — POCT URINALYSIS DIPSTICK
Bilirubin, UA: NEGATIVE
Glucose, UA: NEGATIVE
Ketones, UA: NEGATIVE
Leukocytes, UA: NEGATIVE
Nitrite, UA: NEGATIVE
Protein, UA: NEGATIVE
Spec Grav, UA: 1.02 (ref 1.010–1.025)
pH, UA: 6 (ref 5.0–8.0)

## 2021-08-08 LAB — POCT WET PREP WITH KOH
Clue Cells Wet Prep HPF POC: NEGATIVE
KOH Prep POC: NEGATIVE
Trichomonas, UA: NEGATIVE
Yeast Wet Prep HPF POC: NEGATIVE

## 2021-08-08 MED ORDER — FLUCONAZOLE 150 MG PO TABS: 150.0000 mg | ORAL_TABLET | Freq: Once | ORAL | 0 refills | Status: AC

## 2021-08-08 NOTE — Progress Notes (Signed)
[name], MD   Chief Complaint  Patient presents with   Urinary Tract Infection    Frequent urination x 4 days, just don't feel right down there. RM 13    HPI:      Veronica Gibson is a 45 y.o. [id number] whose LMP was No LMP recorded. (Menstrual status: IUD)., presents today for urinary frequency with good flow for 4 days, no dysuria, LBP, urgency pelvic pain, fevers. Has had some blood with wiping and on pantyliner. Drinks caffeine daily. Hx of UTIs triggered by sex so takes macrobid PC but didn't take it after sex a few days ago. Also felt like had a yeast infection a few wks ago and treated with monistat-3 with some relief, but still has some vaginal itching, no d/c/odor. Also had some pain with sex recently and that is unusual for pt.  Pt with Mirena IUD, has annual 08/30/21.   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   TENDINITIS, TIBIALIS 04/03/2007   KNEE PAIN, RIGHT 03/13/2007    Past Surgical History:  Procedure Laterality Date   ACHILLES TENDON REPAIR  08/18/2017   WISDOM TOOTH EXTRACTION      Family History  Problem Relation Age of Onset   Cancer Paternal Grandmother 70       UTERINE   Coronary artery disease Paternal Grandfather        CABG   Cancer Paternal Aunt        uterine   Cancer Other        uterine    Social History   Socioeconomic History   Marital status: Divorced    Spouse name: Not on file   Number of children: 2   Years of education:  18   Highest education level: Not on file  Occupational History   Occupation: Designer, jewellery: [employer]  Tobacco Use   Smoking status: Never   Smokeless tobacco: Never  Vaping Use   Vaping Use: Never used  Substance and Sexual Activity   Alcohol use: Yes    Comment: OCC   Drug use: No   Sexual activity: Yes    Birth control/protection: I.U.D.    Comment: Mirena  Other Topics Concern   Not on file  Social History Narrative   Not on file   Social Determinants of Health   Financial Resource Strain: Not on file  Food Insecurity: Not on file  Transportation Needs: Not on file  Physical Activity: Not on file  Stress: Not on file  Social Connections: Not on file  Intimate Partner Violence: Not on file    Outpatient Medications Prior Veronica Visit  Medication Sig Dispense Refill   azelastine (ASTELIN) 0.1 % nasal spray SMARTSIG:1-2 Spray(s) Both Nares Twice Daily     fexofenadine (ALLEGRA) 180 MG tablet      loratadine (CLARITIN) 10 MG tablet Take 10 mg by mouth 2 (two) times daily.      nitrofurantoin, macrocrystal-monohydrate, (MACROBID) 100 MG capsule TAKE 1 CAPSULE  BY MOUTH EVERY DAY AFTER SEX AS PREVENTATIVE 30 capsule 2   spironolactone (ALDACTONE) 50 MG tablet Take 50 mg by mouth 2 (two) times daily.     valACYclovir (VALTREX) 500 MG tablet Take 1 tablet (500 mg total) by mouth daily. 90 tablet 3   fluticasone (FLONASE) 50 MCG/ACT nasal spray PLACE 1-2 SPRAY IN EACH NOSTRIL ONCE A DAY  3   levonorgestrel (MIRENA, 52 MG,) 20 MCG/24HR IUD 1 Intra Uterine Device (1 each total) by Intrauterine route once for 1 dose. 1 Intra Uterine Device 0   No facility-administered medications prior Veronica visit.      ROS:  Review of Systems  Constitutional:  Negative for fever.  Gastrointestinal:  Negative for blood in stool, constipation, diarrhea, nausea and vomiting.  Genitourinary:  Positive for dyspareunia and frequency. Negative for dysuria, flank pain, hematuria,  urgency, vaginal bleeding, vaginal discharge and vaginal pain.  Musculoskeletal:  Negative for back pain.  Skin:  Negative for rash.  BREAST: No symptoms   OBJECTIVE:   Vitals:  BP (!) 98/56   Ht 5\' 6"  (1.676 m)   Wt 124 lb (56.2 kg)   BMI 20.01 kg/m   Physical Exam Vitals reviewed.  Constitutional:      Appearance: She is well-developed.  Pulmonary:     Effort: Pulmonary effort is normal.  Genitourinary:    General: Normal vulva.     Pubic Area: No rash.      Labia:        Right: No rash, tenderness or lesion.        Left: No rash, tenderness or lesion.      Vagina: Bleeding present. No vaginal discharge, erythema or tenderness.     Cervix: Normal.     Uterus: Normal. Not enlarged and not tender.      Adnexa: Right adnexa normal and left adnexa normal.       Right: No mass or tenderness.         Left: No mass or tenderness.       Comments: IUD STRINGS IN CX OS Musculoskeletal:        General: Normal range of motion.     Cervical back: Normal range of motion.  Skin:    General: Skin is warm and dry.  Neurological:     General: No focal deficit present.     Mental Status: She is alert and oriented Veronica person, place, and time.  Psychiatric:        Mood and Affect: Mood normal.        Behavior: Behavior normal.        Thought Content: Thought content normal.        Judgment: Judgment normal.    Results: Results for orders placed or performed in visit on 08/08/21 (from the past 24 hour(s))  POCT Wet Prep with KOH     Status: Normal   Collection Time: 08/08/21  3:25 PM  Result Value Ref Range   Trichomonas, UA Negative    Clue Cells Wet Prep HPF POC neg    Epithelial Wet Prep HPF POC     Yeast Wet Prep HPF POC neg    Bacteria Wet Prep HPF POC     RBC Wet Prep HPF POC     WBC Wet Prep HPF POC     KOH Prep POC Negative Negative  POCT Urinalysis Dipstick     Status: Abnormal   Collection Time: 08/08/21  5:15 PM  Result Value Ref Range  Color, UA yellow     Clarity, UA clear    Glucose, UA Negative Negative   Bilirubin, UA neg    Ketones, UA neg    Spec Grav, UA 1.020 1.010 - 1.025   Blood, UA small    pH, UA 6.0 5.0 - 8.0   Protein, UA Negative Negative   Urobilinogen, UA     Nitrite, UA neg    Leukocytes, UA Negative Negative   Appearance     Odor     PT WITH MENSTRUAL LIGHT SPOTTING  Assessment/Plan: Vaginal irritation - Plan: fluconazole (DIFLUCAN) 150 MG tablet, POCT Wet Prep with KOH, neg wet prep, neg exam. Treat empirically with diflucan. F/u prn.   Urinary frequency - Plan: POCT Urinalysis Dipstick; neg UA. Check C&S. D/C caffeine. Will f/u if C&S pos. If pt's sx progress, she has macrobid for PC tx and can do BID for 5 days (pt going out of town in a few days). F/u prn   Meds ordered this encounter  Medications   fluconazole (DIFLUCAN) 150 MG tablet    Sig: Take 1 tablet (150 mg total) by mouth once for 1 dose.    Dispense:  1 tablet    Refill:  0    Order Specific Question:   Supervising Provider    Answer:   Gae Dry [[id number]]       Return if symptoms worsen or fail Veronica improve.  Kellyanne Ellwanger B. Lanai Conlee, PA-C 08/08/2021 5:18 PM

## 2021-08-09 ENCOUNTER — Ambulatory Visit: Payer: BC Managed Care – PPO | Admitting: Unknown Physician Specialty

## 2021-08-14 DIAGNOSIS — J3089 Other allergic rhinitis: Secondary | ICD-10-CM | POA: Diagnosis not present

## 2021-08-14 DIAGNOSIS — J301 Allergic rhinitis due to pollen: Secondary | ICD-10-CM | POA: Diagnosis not present

## 2021-08-14 DIAGNOSIS — J3081 Allergic rhinitis due to animal (cat) (dog) hair and dander: Secondary | ICD-10-CM | POA: Diagnosis not present

## 2021-08-23 DIAGNOSIS — J3081 Allergic rhinitis due to animal (cat) (dog) hair and dander: Secondary | ICD-10-CM | POA: Diagnosis not present

## 2021-08-23 DIAGNOSIS — J301 Allergic rhinitis due to pollen: Secondary | ICD-10-CM | POA: Diagnosis not present

## 2021-08-23 DIAGNOSIS — J3089 Other allergic rhinitis: Secondary | ICD-10-CM | POA: Diagnosis not present

## 2021-08-27 DIAGNOSIS — L700 Acne vulgaris: Secondary | ICD-10-CM | POA: Diagnosis not present

## 2021-08-27 DIAGNOSIS — D225 Melanocytic nevi of trunk: Secondary | ICD-10-CM | POA: Diagnosis not present

## 2021-08-27 DIAGNOSIS — D2262 Melanocytic nevi of left upper limb, including shoulder: Secondary | ICD-10-CM | POA: Diagnosis not present

## 2021-08-27 DIAGNOSIS — D2261 Melanocytic nevi of right upper limb, including shoulder: Secondary | ICD-10-CM | POA: Diagnosis not present

## 2021-08-28 DIAGNOSIS — J3089 Other allergic rhinitis: Secondary | ICD-10-CM | POA: Diagnosis not present

## 2021-08-28 DIAGNOSIS — J3081 Allergic rhinitis due to animal (cat) (dog) hair and dander: Secondary | ICD-10-CM | POA: Diagnosis not present

## 2021-08-28 DIAGNOSIS — J301 Allergic rhinitis due to pollen: Secondary | ICD-10-CM | POA: Diagnosis not present

## 2021-08-29 NOTE — Progress Notes (Addendum)
Chief Complaint  Patient presents with   Gynecologic Exam    No concerns     HPI:      Veronica Gibson is a 45 y.o. [id number] who LMP was No LMP recorded. (Menstrual status: IUD)., presents today for her annual examination.  Her menses are absent with IUD, placed 3/19. Has occas light spotting, no dysmen.  Sex activity: single partner, contraception - IUD. Mirena placed 11/25/17.  Last Pap: 05/18/20 Results were: ASCUS /neg HPV DNA; repeat due after 3 yrs per ASCCP guidelines but will do this yr for pt reassurance.  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 Taking macrobid postcoitally with sx control. Doing well. Had yeast vag 11/22 with sx relief after diflucan tx.  Last mammo: 11/20/20  Results were normal, repeat in 12 months There is no FH of breast cancer. There is no FH of ovarian cancer. The patient does not do self-breast exams. Pt with FH uterine cancer in her pat aunt and PGM, genetic testing not done. Pt will clarify dx and ages.   Tobacco use: The patient denies current or previous tobacco use. Alcohol use: social drinker  No drug use. Exercise: very active  She does get adequate calcium and Vitamin D in her diet. Labs with PCP  Past Medical History:  Diagnosis Date   Anxiety    Genital herpes 03/2020   confirmed by HSV 2 IgG   Head ache    History of mammogram 12/13/2013   CAT 2   History of Papanicolaou smear of cervix 04/07/2014   -/-   Rupture Achilles tendon 08/14/2017   Right   Seasonal allergies    Vaccine for human papilloma virus (HPV) types 6, 11, 16, and 18 administered     Past Surgical History:  Procedure Laterality Date   ACHILLES TENDON REPAIR  08/18/2017   WISDOM TOOTH EXTRACTION      Family History  Problem Relation Age of Onset   Cancer Paternal Grandmother 64       UTERINE   Coronary artery disease Paternal Grandfather        CABG    Cancer Paternal Aunt        uterine   Cancer Other        uterine    Social History   Socioeconomic History   Marital status: Divorced    Spouse name: Not on file   Number of children: 2   Years of education: 18   Highest education level: Not on file  Occupational History   Occupation: Designer, jewellery: [employer]  Tobacco Use   Smoking status: Never   Smokeless tobacco: Never  Vaping Use   Vaping Use: Never used  Substance and Sexual Activity   Alcohol use: Yes    Comment: OCC   Drug use: No   Sexual activity: Yes    Birth control/protection: I.U.D.    Comment: Mirena  Other Topics Concern   Not on file  Social History Narrative   Not on file   Social Determinants of Health   Financial Resource Strain: Not on file  Food Insecurity: Not on file  Transportation Needs: Not on file  Physical Activity: Not on file  Stress: Not on file  Social Connections: Not on file  Intimate Partner Violence: Not on file     Current Outpatient Medications:    azelastine (  ASTELIN) 0.1 % nasal spray, SMARTSIG:1-2 Spray(s) Both Nares Twice Daily, Disp: , Rfl:    fexofenadine (ALLEGRA) 180 MG tablet, , Disp: , Rfl:    fluticasone (FLONASE) 50 MCG/ACT nasal spray, PLACE 1-2 SPRAY IN EACH NOSTRIL ONCE A DAY, Disp: , Rfl: 3   loratadine (CLARITIN) 10 MG tablet, Take 10 mg by mouth 2 (two) times daily. , Disp: , Rfl:    spironolactone (ALDACTONE) 50 MG tablet, Take 50 mg by mouth 2 (two) times daily., Disp: , Rfl:    levonorgestrel (MIRENA, 52 MG,) 20 MCG/24HR IUD, 1 Intra Uterine Device (1 each total) by Intrauterine route once for 1 dose., Disp: 1 Intra Uterine Device, Rfl: 0   nitrofurantoin, macrocrystal-monohydrate, (MACROBID) 100 MG capsule, TAKE 1 CAPSULE BY MOUTH EVERY DAY AFTER SEX AS PREVENTATIVE, Disp: 30 capsule, Rfl: 2   valACYclovir (VALTREX) 500 MG tablet, Take 1 tablet (500 mg total) by mouth daily., Disp: 90 tablet, Rfl: 3  ROS:  Review of Systems   Constitutional:  Negative for fatigue, fever and unexpected weight change.  Respiratory:  Negative for cough, shortness of breath and wheezing.   Cardiovascular:  Negative for chest pain, palpitations and leg swelling.  Gastrointestinal:  Negative for blood in stool, constipation, diarrhea, nausea and vomiting.  Endocrine: Negative for cold intolerance, heat intolerance and polyuria.  Genitourinary:  Negative for dyspareunia, dysuria, flank pain, frequency, genital sores, hematuria, menstrual problem, pelvic pain, urgency, vaginal bleeding, vaginal discharge and vaginal pain.  Musculoskeletal:  Negative for back pain, joint swelling and myalgias.  Skin:  Negative for rash.  Neurological:  Negative for dizziness, syncope, light-headedness, numbness and headaches.  Hematological:  Negative for adenopathy.  Psychiatric/Behavioral:  Negative for agitation, confusion, dysphoric mood, sleep disturbance and suicidal ideas. The patient is not nervous/anxious.     Objective: BP 90/60   Ht 5\' 6"  (1.676 m)   Wt 121 lb (54.9 kg)   BMI 19.53 kg/m    Physical Exam Constitutional:      Appearance: She is well-developed.  Genitourinary:     Vulva normal.     Right Labia: No rash, tenderness or lesions.    Left Labia: No tenderness, lesions or rash.    No vaginal discharge, erythema or tenderness.      Right Adnexa: not tender and no mass present.    Left Adnexa: not tender and no mass present.    No cervical friability or polyp.     IUD strings visualized.     Uterus is not enlarged or tender.  Breasts:    Right: No mass, nipple discharge, skin change or tenderness.     Left: No mass, nipple discharge, skin change or tenderness.  Neck:     Thyroid: No thyromegaly.  Cardiovascular:     Rate and Rhythm: Normal rate and regular rhythm.     Heart sounds: Normal heart sounds. No murmur heard. Pulmonary:     Effort: Pulmonary effort is normal.     Breath sounds: Normal breath sounds.   Abdominal:     Palpations: Abdomen is soft.     Tenderness: There is no abdominal tenderness. There is no guarding or rebound.  Musculoskeletal:        General: Normal range of motion.     Cervical back: Normal range of motion.  Lymphadenopathy:     Cervical: No cervical adenopathy.  Neurological:     General: No focal deficit present.     Mental Status: She is alert and oriented to person,  place, and time.     Cranial Nerves: No cranial nerve deficit.  Skin:    General: Skin is warm and dry.  Psychiatric:        Mood and Affect: Mood normal.        Behavior: Behavior normal.        Thought Content: Thought content normal.        Judgment: Judgment normal.  Vitals reviewed.    Assessment/Plan: Encounter for annual routine gynecological examination  Cervical cancer screening - Plan: Cytology - PAP  Screening for HPV (human papillomavirus) - Plan: Cytology - PAP  ASCUS of cervix with negative high risk HPV - Plan: Cytology - PAP; repeat pap today.   Encounter for routine checking of intrauterine contraceptive device (IUD)--IUD strings in cx os; has 8 yr indication now.  Encounter for screening mammogram for malignant neoplasm of breast - Plan: MM 3D SCREEN BREAST BILATERAL; pt to sched mammo  Herpes simplex vulvovaginitis - Plan: valACYclovir (VALTREX) 500 MG tablet; Rx RF. Takes daily, discussed prn.   Postcoital UTI - Plan: nitrofurantoin, macrocrystal-monohydrate, (MACROBID) 100 MG capsule; Rx RF.   Family history of uterine cancer--pt to clarify dx and ages. May qualify for cancer genetic testing.    Meds ordered this encounter  Medications   valACYclovir (VALTREX) 500 MG tablet    Sig: Take 1 tablet (500 mg total) by mouth daily.    Dispense:  90 tablet    Refill:  3    Order Specific Question:   Supervising Provider    Answer:   Gae Dry [[id number]]   nitrofurantoin, macrocrystal-monohydrate, (MACROBID) 100 MG capsule    Sig: TAKE 1 CAPSULE BY MOUTH EVERY  DAY AFTER SEX AS PREVENTATIVE    Dispense:  30 capsule    Refill:  2    Order Specific Question:   Supervising Provider    Answer:   Gae Dry [[id number]]               GYN counsel mammography screening, adequate intake of calcium and vitamin D    F/U  Return in about 1 year (around 08/30/2022).  Ayanah Snader B. Camille Thau, PA-C 08/30/2021 8:57 AM  09/03/21 Pt was able to clarify FH and doesn't qualify for cancer genetic testing.  Family History  Problem Relation Age of Onset   Uterine cancer Paternal Grandmother 80   Coronary artery disease Paternal Grandfather        CABG   Cervical cancer Maternal Aunt        died at 91   Cervical cancer Paternal Aunt 48   Pancreatic cancer Maternal Great-grandmother 50   Uterine cancer Other 68   Colon cancer Other

## 2021-08-30 ENCOUNTER — Other Ambulatory Visit (HOSPITAL_COMMUNITY)
Admission: RE | Admit: 2021-08-30 | Discharge: 2021-08-30 | Disposition: A | Discharge status: Home / Self Care | Payer: BC Managed Care – PPO | Source: Ambulatory Visit | Attending: Obstetrics and Gynecology | Admitting: Obstetrics and Gynecology

## 2021-08-30 ENCOUNTER — Encounter: Payer: Self-pay | Admitting: Obstetrics and Gynecology

## 2021-08-30 ENCOUNTER — Other Ambulatory Visit: Payer: Self-pay

## 2021-08-30 ENCOUNTER — Ambulatory Visit (INDEPENDENT_AMBULATORY_CARE_PROVIDER_SITE_OTHER): Payer: BC Managed Care – PPO | Admitting: Obstetrics and Gynecology

## 2021-08-30 VITALS — BP 90/60 | Ht 66.0 in | Wt 121.0 lb

## 2021-08-30 DIAGNOSIS — Z1231 Encounter for screening mammogram for malignant neoplasm of breast: Secondary | ICD-10-CM

## 2021-08-30 DIAGNOSIS — Z1151 Encounter for screening for human papillomavirus (HPV): Secondary | ICD-10-CM | POA: Diagnosis not present

## 2021-08-30 DIAGNOSIS — Z01419 Encounter for gynecological examination (general) (routine) without abnormal findings: Secondary | ICD-10-CM

## 2021-08-30 DIAGNOSIS — Z124 Encounter for screening for malignant neoplasm of cervix: Secondary | ICD-10-CM

## 2021-08-30 DIAGNOSIS — Z8049 Family history of malignant neoplasm of other genital organs: Secondary | ICD-10-CM | POA: Insufficient documentation

## 2021-08-30 DIAGNOSIS — R87610 Atypical squamous cells of undetermined significance on cytologic smear of cervix (ASC-US): Secondary | ICD-10-CM | POA: Diagnosis not present

## 2021-08-30 DIAGNOSIS — Z30431 Encounter for routine checking of intrauterine contraceptive device: Secondary | ICD-10-CM

## 2021-08-30 DIAGNOSIS — N390 Urinary tract infection, site not specified: Secondary | ICD-10-CM

## 2021-08-30 DIAGNOSIS — A6004 Herpesviral vulvovaginitis: Secondary | ICD-10-CM

## 2021-08-30 MED ORDER — VALACYCLOVIR HCL 500 MG PO TABS
500.0000 mg | ORAL_TABLET | Freq: Every day | ORAL | 3 refills | Status: DC
Start: 2021-08-30 — End: 2022-09-03

## 2021-08-30 MED ORDER — NITROFURANTOIN MONOHYD MACRO 100 MG PO CAPS: ORAL_CAPSULE | ORAL | 2 refills | Status: DC

## 2021-08-30 NOTE — Patient Instructions (Signed)
I value your feedback and you entrusting us with your care. If you get a [facility] patient survey, I would appreciate you taking the time to let us know about your experience today. Thank you!  [facility]: [phone]      

## 2021-08-31 ENCOUNTER — Encounter: Payer: Self-pay | Admitting: Obstetrics and Gynecology

## 2021-09-04 DIAGNOSIS — J301 Allergic rhinitis due to pollen: Secondary | ICD-10-CM | POA: Diagnosis not present

## 2021-09-04 DIAGNOSIS — J3081 Allergic rhinitis due to animal (cat) (dog) hair and dander: Secondary | ICD-10-CM | POA: Diagnosis not present

## 2021-09-04 DIAGNOSIS — J3089 Other allergic rhinitis: Secondary | ICD-10-CM | POA: Diagnosis not present

## 2021-09-06 DIAGNOSIS — J3089 Other allergic rhinitis: Secondary | ICD-10-CM | POA: Diagnosis not present

## 2021-09-06 DIAGNOSIS — F411 Generalized anxiety disorder: Secondary | ICD-10-CM | POA: Diagnosis not present

## 2021-09-06 DIAGNOSIS — J301 Allergic rhinitis due to pollen: Secondary | ICD-10-CM | POA: Diagnosis not present

## 2021-09-06 DIAGNOSIS — J3081 Allergic rhinitis due to animal (cat) (dog) hair and dander: Secondary | ICD-10-CM | POA: Diagnosis not present

## 2021-09-06 LAB — CYTOLOGY - PAP
Comment: NEGATIVE
Diagnosis: UNDETERMINED — AB
High risk HPV: NEGATIVE

## 2021-09-20 DIAGNOSIS — J301 Allergic rhinitis due to pollen: Secondary | ICD-10-CM | POA: Diagnosis not present

## 2021-09-20 DIAGNOSIS — J3089 Other allergic rhinitis: Secondary | ICD-10-CM | POA: Diagnosis not present

## 2021-09-20 DIAGNOSIS — J3081 Allergic rhinitis due to animal (cat) (dog) hair and dander: Secondary | ICD-10-CM | POA: Diagnosis not present

## 2021-10-16 DIAGNOSIS — J3089 Other allergic rhinitis: Secondary | ICD-10-CM | POA: Diagnosis not present

## 2021-10-16 DIAGNOSIS — J301 Allergic rhinitis due to pollen: Secondary | ICD-10-CM | POA: Diagnosis not present

## 2021-10-16 DIAGNOSIS — J3081 Allergic rhinitis due to animal (cat) (dog) hair and dander: Secondary | ICD-10-CM | POA: Diagnosis not present

## 2021-10-19 DIAGNOSIS — J301 Allergic rhinitis due to pollen: Secondary | ICD-10-CM | POA: Diagnosis not present

## 2021-10-19 DIAGNOSIS — J3081 Allergic rhinitis due to animal (cat) (dog) hair and dander: Secondary | ICD-10-CM | POA: Diagnosis not present

## 2021-10-19 DIAGNOSIS — J3089 Other allergic rhinitis: Secondary | ICD-10-CM | POA: Diagnosis not present

## 2021-10-23 DIAGNOSIS — J301 Allergic rhinitis due to pollen: Secondary | ICD-10-CM | POA: Diagnosis not present

## 2021-10-23 DIAGNOSIS — J3081 Allergic rhinitis due to animal (cat) (dog) hair and dander: Secondary | ICD-10-CM | POA: Diagnosis not present

## 2021-10-23 DIAGNOSIS — J3089 Other allergic rhinitis: Secondary | ICD-10-CM | POA: Diagnosis not present

## 2021-10-26 DIAGNOSIS — J3081 Allergic rhinitis due to animal (cat) (dog) hair and dander: Secondary | ICD-10-CM | POA: Diagnosis not present

## 2021-10-26 DIAGNOSIS — J3089 Other allergic rhinitis: Secondary | ICD-10-CM | POA: Diagnosis not present

## 2021-10-26 DIAGNOSIS — J301 Allergic rhinitis due to pollen: Secondary | ICD-10-CM | POA: Diagnosis not present

## 2021-10-30 DIAGNOSIS — J3089 Other allergic rhinitis: Secondary | ICD-10-CM | POA: Diagnosis not present

## 2021-10-30 DIAGNOSIS — J301 Allergic rhinitis due to pollen: Secondary | ICD-10-CM | POA: Diagnosis not present

## 2021-10-30 DIAGNOSIS — J3081 Allergic rhinitis due to animal (cat) (dog) hair and dander: Secondary | ICD-10-CM | POA: Diagnosis not present

## 2021-11-06 DIAGNOSIS — J301 Allergic rhinitis due to pollen: Secondary | ICD-10-CM | POA: Diagnosis not present

## 2021-11-06 DIAGNOSIS — J3081 Allergic rhinitis due to animal (cat) (dog) hair and dander: Secondary | ICD-10-CM | POA: Diagnosis not present

## 2021-11-06 DIAGNOSIS — J3089 Other allergic rhinitis: Secondary | ICD-10-CM | POA: Diagnosis not present

## 2021-11-16 DIAGNOSIS — J3081 Allergic rhinitis due to animal (cat) (dog) hair and dander: Secondary | ICD-10-CM | POA: Diagnosis not present

## 2021-11-16 DIAGNOSIS — J301 Allergic rhinitis due to pollen: Secondary | ICD-10-CM | POA: Diagnosis not present

## 2021-11-16 DIAGNOSIS — J3089 Other allergic rhinitis: Secondary | ICD-10-CM | POA: Diagnosis not present

## 2021-11-21 DIAGNOSIS — F411 Generalized anxiety disorder: Secondary | ICD-10-CM | POA: Diagnosis not present

## 2021-11-23 DIAGNOSIS — J3089 Other allergic rhinitis: Secondary | ICD-10-CM | POA: Diagnosis not present

## 2021-11-23 DIAGNOSIS — J3081 Allergic rhinitis due to animal (cat) (dog) hair and dander: Secondary | ICD-10-CM | POA: Diagnosis not present

## 2021-11-23 DIAGNOSIS — J301 Allergic rhinitis due to pollen: Secondary | ICD-10-CM | POA: Diagnosis not present

## 2021-11-26 ENCOUNTER — Other Ambulatory Visit: Payer: Self-pay

## 2021-11-26 ENCOUNTER — Ambulatory Visit
Admission: RE | Admit: 2021-11-26 | Discharge: 2021-11-26 | Disposition: A | Discharge status: Home / Self Care | Payer: BC Managed Care – PPO | Source: Ambulatory Visit | Attending: Obstetrics and Gynecology | Admitting: Obstetrics and Gynecology

## 2021-11-26 DIAGNOSIS — Z1231 Encounter for screening mammogram for malignant neoplasm of breast: Secondary | ICD-10-CM | POA: Diagnosis not present

## 2021-11-30 DIAGNOSIS — J301 Allergic rhinitis due to pollen: Secondary | ICD-10-CM | POA: Diagnosis not present

## 2021-11-30 DIAGNOSIS — J3089 Other allergic rhinitis: Secondary | ICD-10-CM | POA: Diagnosis not present

## 2021-11-30 DIAGNOSIS — J3081 Allergic rhinitis due to animal (cat) (dog) hair and dander: Secondary | ICD-10-CM | POA: Diagnosis not present

## 2021-12-04 DIAGNOSIS — J3089 Other allergic rhinitis: Secondary | ICD-10-CM | POA: Diagnosis not present

## 2021-12-04 DIAGNOSIS — J3081 Allergic rhinitis due to animal (cat) (dog) hair and dander: Secondary | ICD-10-CM | POA: Diagnosis not present

## 2021-12-04 DIAGNOSIS — J301 Allergic rhinitis due to pollen: Secondary | ICD-10-CM | POA: Diagnosis not present

## 2021-12-14 DIAGNOSIS — J3089 Other allergic rhinitis: Secondary | ICD-10-CM | POA: Diagnosis not present

## 2021-12-14 DIAGNOSIS — J301 Allergic rhinitis due to pollen: Secondary | ICD-10-CM | POA: Diagnosis not present

## 2021-12-14 DIAGNOSIS — J3081 Allergic rhinitis due to animal (cat) (dog) hair and dander: Secondary | ICD-10-CM | POA: Diagnosis not present

## 2021-12-18 DIAGNOSIS — J3089 Other allergic rhinitis: Secondary | ICD-10-CM | POA: Diagnosis not present

## 2021-12-18 DIAGNOSIS — J301 Allergic rhinitis due to pollen: Secondary | ICD-10-CM | POA: Diagnosis not present

## 2021-12-18 DIAGNOSIS — J3081 Allergic rhinitis due to animal (cat) (dog) hair and dander: Secondary | ICD-10-CM | POA: Diagnosis not present

## 2021-12-25 DIAGNOSIS — J301 Allergic rhinitis due to pollen: Secondary | ICD-10-CM | POA: Diagnosis not present

## 2021-12-25 DIAGNOSIS — J3089 Other allergic rhinitis: Secondary | ICD-10-CM | POA: Diagnosis not present

## 2021-12-25 DIAGNOSIS — J3081 Allergic rhinitis due to animal (cat) (dog) hair and dander: Secondary | ICD-10-CM | POA: Diagnosis not present

## 2022-01-04 DIAGNOSIS — J3081 Allergic rhinitis due to animal (cat) (dog) hair and dander: Secondary | ICD-10-CM | POA: Diagnosis not present

## 2022-01-04 DIAGNOSIS — J3089 Other allergic rhinitis: Secondary | ICD-10-CM | POA: Diagnosis not present

## 2022-01-04 DIAGNOSIS — J301 Allergic rhinitis due to pollen: Secondary | ICD-10-CM | POA: Diagnosis not present

## 2022-01-11 DIAGNOSIS — J3081 Allergic rhinitis due to animal (cat) (dog) hair and dander: Secondary | ICD-10-CM | POA: Diagnosis not present

## 2022-01-11 DIAGNOSIS — J3089 Other allergic rhinitis: Secondary | ICD-10-CM | POA: Diagnosis not present

## 2022-01-11 DIAGNOSIS — J301 Allergic rhinitis due to pollen: Secondary | ICD-10-CM | POA: Diagnosis not present

## 2022-01-15 DIAGNOSIS — J301 Allergic rhinitis due to pollen: Secondary | ICD-10-CM | POA: Diagnosis not present

## 2022-01-15 DIAGNOSIS — J3089 Other allergic rhinitis: Secondary | ICD-10-CM | POA: Diagnosis not present

## 2022-01-15 DIAGNOSIS — J3081 Allergic rhinitis due to animal (cat) (dog) hair and dander: Secondary | ICD-10-CM | POA: Diagnosis not present

## 2022-01-25 DIAGNOSIS — J3081 Allergic rhinitis due to animal (cat) (dog) hair and dander: Secondary | ICD-10-CM | POA: Diagnosis not present

## 2022-01-25 DIAGNOSIS — J3089 Other allergic rhinitis: Secondary | ICD-10-CM | POA: Diagnosis not present

## 2022-01-25 DIAGNOSIS — J301 Allergic rhinitis due to pollen: Secondary | ICD-10-CM | POA: Diagnosis not present

## 2022-01-31 DIAGNOSIS — J3081 Allergic rhinitis due to animal (cat) (dog) hair and dander: Secondary | ICD-10-CM | POA: Diagnosis not present

## 2022-01-31 DIAGNOSIS — J301 Allergic rhinitis due to pollen: Secondary | ICD-10-CM | POA: Diagnosis not present

## 2022-01-31 DIAGNOSIS — F411 Generalized anxiety disorder: Secondary | ICD-10-CM | POA: Diagnosis not present

## 2022-01-31 DIAGNOSIS — J3089 Other allergic rhinitis: Secondary | ICD-10-CM | POA: Diagnosis not present

## 2022-02-05 DIAGNOSIS — J301 Allergic rhinitis due to pollen: Secondary | ICD-10-CM | POA: Diagnosis not present

## 2022-02-05 DIAGNOSIS — J3089 Other allergic rhinitis: Secondary | ICD-10-CM | POA: Diagnosis not present

## 2022-02-05 DIAGNOSIS — J3081 Allergic rhinitis due to animal (cat) (dog) hair and dander: Secondary | ICD-10-CM | POA: Diagnosis not present

## 2022-02-12 DIAGNOSIS — J3081 Allergic rhinitis due to animal (cat) (dog) hair and dander: Secondary | ICD-10-CM | POA: Diagnosis not present

## 2022-02-12 DIAGNOSIS — J3089 Other allergic rhinitis: Secondary | ICD-10-CM | POA: Diagnosis not present

## 2022-02-12 DIAGNOSIS — J301 Allergic rhinitis due to pollen: Secondary | ICD-10-CM | POA: Diagnosis not present

## 2022-02-19 DIAGNOSIS — J301 Allergic rhinitis due to pollen: Secondary | ICD-10-CM | POA: Diagnosis not present

## 2022-02-19 DIAGNOSIS — J3081 Allergic rhinitis due to animal (cat) (dog) hair and dander: Secondary | ICD-10-CM | POA: Diagnosis not present

## 2022-02-19 DIAGNOSIS — J3089 Other allergic rhinitis: Secondary | ICD-10-CM | POA: Diagnosis not present

## 2022-02-26 DIAGNOSIS — J3081 Allergic rhinitis due to animal (cat) (dog) hair and dander: Secondary | ICD-10-CM | POA: Diagnosis not present

## 2022-02-26 DIAGNOSIS — J3089 Other allergic rhinitis: Secondary | ICD-10-CM | POA: Diagnosis not present

## 2022-02-26 DIAGNOSIS — J301 Allergic rhinitis due to pollen: Secondary | ICD-10-CM | POA: Diagnosis not present

## 2022-02-27 DIAGNOSIS — J3089 Other allergic rhinitis: Secondary | ICD-10-CM | POA: Diagnosis not present

## 2022-03-07 DIAGNOSIS — J3089 Other allergic rhinitis: Secondary | ICD-10-CM | POA: Diagnosis not present

## 2022-03-07 DIAGNOSIS — J3081 Allergic rhinitis due to animal (cat) (dog) hair and dander: Secondary | ICD-10-CM | POA: Diagnosis not present

## 2022-03-07 DIAGNOSIS — J301 Allergic rhinitis due to pollen: Secondary | ICD-10-CM | POA: Diagnosis not present

## 2022-03-12 DIAGNOSIS — J3089 Other allergic rhinitis: Secondary | ICD-10-CM | POA: Diagnosis not present

## 2022-03-12 DIAGNOSIS — J3081 Allergic rhinitis due to animal (cat) (dog) hair and dander: Secondary | ICD-10-CM | POA: Diagnosis not present

## 2022-03-12 DIAGNOSIS — J301 Allergic rhinitis due to pollen: Secondary | ICD-10-CM | POA: Diagnosis not present

## 2022-03-14 ENCOUNTER — Encounter: Payer: Self-pay | Admitting: Obstetrics and Gynecology

## 2022-03-15 DIAGNOSIS — J3089 Other allergic rhinitis: Secondary | ICD-10-CM | POA: Diagnosis not present

## 2022-03-15 DIAGNOSIS — J301 Allergic rhinitis due to pollen: Secondary | ICD-10-CM | POA: Diagnosis not present

## 2022-03-15 DIAGNOSIS — J3081 Allergic rhinitis due to animal (cat) (dog) hair and dander: Secondary | ICD-10-CM | POA: Diagnosis not present

## 2022-03-19 DIAGNOSIS — J3081 Allergic rhinitis due to animal (cat) (dog) hair and dander: Secondary | ICD-10-CM | POA: Diagnosis not present

## 2022-03-19 DIAGNOSIS — J3089 Other allergic rhinitis: Secondary | ICD-10-CM | POA: Diagnosis not present

## 2022-03-19 DIAGNOSIS — J301 Allergic rhinitis due to pollen: Secondary | ICD-10-CM | POA: Diagnosis not present

## 2022-03-20 ENCOUNTER — Ambulatory Visit: Payer: BC Managed Care – PPO | Admitting: Obstetrics

## 2022-03-20 ENCOUNTER — Other Ambulatory Visit (HOSPITAL_COMMUNITY)
Admission: RE | Admit: 2022-03-20 | Discharge: 2022-03-20 | Disposition: A | Discharge status: Home / Self Care | Payer: BC Managed Care – PPO | Source: Ambulatory Visit | Attending: Advanced Practice Midwife | Admitting: Advanced Practice Midwife

## 2022-03-20 ENCOUNTER — Encounter: Payer: Self-pay | Admitting: Obstetrics

## 2022-03-20 VITALS — BP 100/60 | Ht 66.0 in | Wt 124.0 lb

## 2022-03-20 DIAGNOSIS — F411 Generalized anxiety disorder: Secondary | ICD-10-CM | POA: Diagnosis not present

## 2022-03-20 DIAGNOSIS — Z1629 Resistance to other single specified antibiotic: Secondary | ICD-10-CM | POA: Diagnosis not present

## 2022-03-20 DIAGNOSIS — R102 Pelvic and perineal pain: Secondary | ICD-10-CM

## 2022-03-20 DIAGNOSIS — B964 Proteus (mirabilis) (morganii) as the cause of diseases classified elsewhere: Secondary | ICD-10-CM | POA: Diagnosis not present

## 2022-03-20 DIAGNOSIS — R300 Dysuria: Secondary | ICD-10-CM | POA: Diagnosis not present

## 2022-03-20 LAB — POCT URINALYSIS DIPSTICK
Bilirubin, UA: NEGATIVE
Blood, UA: NEGATIVE
Glucose, UA: NEGATIVE
Ketones, UA: NEGATIVE
Nitrite, UA: NEGATIVE
Protein, UA: NEGATIVE
Spec Grav, UA: 1.01 (ref 1.010–1.025)
Urobilinogen, UA: 0.2 E.U./dL
pH, UA: 5 (ref 5.0–8.0)

## 2022-03-20 NOTE — Progress Notes (Addendum)
[name] is a 46 y.o. [phone] who LMP was No LMP recorded. (Menstrual status: IUD)., presents today for a problem visit.  She complains of burning with urination, pain in the lower abdomen, and suprapubic pressure . She has had symptoms for 6 days. As she has a hx of frequent UTIs, she started on her RX of Macroibd, but after 4 days, did not feel like there was any improvement. She has a hx of HSV, but had only one primary outbreak yeasrs ago, and none since. She denies any lesions or perineal irritation with her symptoms.Her ? Urinary  Symptoms are moderate.  Patient denies back pain, stomach ache, and vaginal discharge. Patient does have a history of recurrent UTI,  does not have a history of pyelonephritis, does not have a history of nephrolithiasis.  She has had previous treatment for her current symptoms.  She added Valtrex to her daily Macrobid just to see if perhaps her symptoms were related to an HSV outbreak, but , again, has seen little change in this vague, pelvic discomfort.  O: BP 100/60   Ht '5\' 6"'$  (1.676 m)   Wt 124 lb (56.2 kg)   BMI 20.01 kg/m   Results for orders placed or performed in visit on 03/20/22 (from the past 24 hour(s))  POCT urinalysis dipstick     Status: Abnormal   Collection Time: 03/20/22 10:40 AM  Result Value Ref Range   Color, UA     Clarity, UA     Glucose, UA Negative Negative   Bilirubin, UA Negative    Ketones, UA Negative    Spec Grav, UA 1.010 1.010 - 1.025   Blood, UA Negative    pH, UA 5.0 5.0 - 8.0   Protein, UA Negative Negative   Urobilinogen, UA 0.2 0.2 or 1.0 E.U./dL   Nitrite, UA Negative    Leukocytes, UA Moderate (2+) (A) Negative   Appearance     Odor     Review of Systems  Constitutional:  Positive for malaise/fatigue.  HENT: Negative.    Eyes: Negative.   Respiratory: Negative.    Cardiovascular: Negative.   Gastrointestinal: Negative.   Genitourinary:  Positive for dysuria.  Musculoskeletal: Negative.   Skin:  Negative.   Neurological: Negative.   Endo/Heme/Allergies: Negative.    Physical Exam Constitutional:      Appearance: Normal appearance. She is normal weight.  HENT:     Head: Normocephalic and atraumatic.  Cardiovascular:     Rate and Rhythm: Normal rate and regular rhythm.  Pulmonary:     Effort: Pulmonary effort is normal.     Breath sounds: Normal breath sounds.  Abdominal:     General: Abdomen is flat.     Palpations: Abdomen is soft.  Genitourinary:    General: Normal vulva.     Rectum: Normal.     Comments: No external lesions or irritation noted. No vaginal discharge . Spec exam reveals pink vaginal mucosa,. IUD strings are visible at the cervical os.. No malodor. Aptima swab retrieved for testing. Musculoskeletal:        General: Normal range of motion.  Skin:    General: Skin is warm and dry.  Neurological:     General: No focal deficit present.     Mental Status: She is oriented to person, place, and time.    A: dysuria, with UTI symptoms, not responding to her usual antibiotic suppression. P: aptima swab sent. Urine sent for culture. She has Macrobid at home, and  I have suggested that she may again have a UTI, but it may not be sensitive to the Nitrofurantoin. Will treat based on the sensitivity. Reassurance provided re negative findings for any herpes lesions. F/U PRN.  Imagene Riches, CNM  03/20/2022 12:01 PM

## 2022-03-21 ENCOUNTER — Encounter: Payer: Self-pay | Admitting: Obstetrics

## 2022-03-21 LAB — CERVICOVAGINAL ANCILLARY ONLY
Bacterial Vaginitis (gardnerella): NEGATIVE
Candida Glabrata: NEGATIVE
Candida Vaginitis: NEGATIVE
Chlamydia: NEGATIVE
Comment: NEGATIVE
Comment: NEGATIVE
Comment: NEGATIVE
Comment: NEGATIVE
Comment: NEGATIVE
Comment: NORMAL
Neisseria Gonorrhea: NEGATIVE
Trichomonas: NEGATIVE

## 2022-03-22 ENCOUNTER — Ambulatory Visit: Payer: BC Managed Care – PPO | Admitting: Advanced Practice Midwife

## 2022-03-23 MED ORDER — SULFAMETHOXAZOLE-TRIMETHOPRIM 800-160 MG PO TABS: 1.0000 | ORAL_TABLET | Freq: Two times a day (BID) | ORAL | 0 refills | Status: DC

## 2022-03-27 LAB — URINE CULTURE

## 2022-04-09 DIAGNOSIS — J3089 Other allergic rhinitis: Secondary | ICD-10-CM | POA: Diagnosis not present

## 2022-04-09 DIAGNOSIS — J3081 Allergic rhinitis due to animal (cat) (dog) hair and dander: Secondary | ICD-10-CM | POA: Diagnosis not present

## 2022-04-09 DIAGNOSIS — J301 Allergic rhinitis due to pollen: Secondary | ICD-10-CM | POA: Diagnosis not present

## 2022-04-12 ENCOUNTER — Encounter: Payer: Self-pay | Admitting: Nurse Practitioner

## 2022-04-12 ENCOUNTER — Ambulatory Visit: Payer: BC Managed Care – PPO | Admitting: Nurse Practitioner

## 2022-04-12 VITALS — BP 124/72 | HR 66 | Temp 98.0°F | Resp 16 | Ht 62.0 in | Wt 125.1 lb

## 2022-04-12 DIAGNOSIS — R300 Dysuria: Secondary | ICD-10-CM

## 2022-04-12 LAB — POCT URINALYSIS DIPSTICK
Bilirubin, UA: NEGATIVE
Glucose, UA: NEGATIVE
Ketones, UA: NEGATIVE
Nitrite, UA: NEGATIVE
Odor: NORMAL
Protein, UA: POSITIVE — AB
Spec Grav, UA: 1.02 (ref 1.010–1.025)
Urobilinogen, UA: 0.2 E.U./dL
pH, UA: 6 (ref 5.0–8.0)

## 2022-04-12 MED ORDER — CEPHALEXIN 500 MG PO CAPS: 500.0000 mg | ORAL_CAPSULE | Freq: Two times a day (BID) | ORAL | 0 refills | Status: AC

## 2022-04-12 NOTE — Progress Notes (Signed)
BP 124/72   Pulse 66   Temp 98 F (36.7 C)   Resp 16   Ht '5\' 2"'$  (1.575 m)   Wt 125 lb 1.6 oz (56.7 kg)   SpO2 98%   BMI 22.88 kg/m    Subjective:    Patient ID: Veronica Gibson, female    DOB: 03/15/1976, 46 y.o.   MRN: [id number]  HPI: Veronica Gibson is a 46 y.o. female  Chief Complaint  Patient presents with   Urinary Tract Infection   Urinary frequency/dysuria/urgency: Patient reports that symptoms started yesterday.  She started having urinary frequency and urgency and burning on urination.  She denies any fever, nausea, back pain.  Patient states that recently she has had recurrent UTIs.  The end of the month she does have an appointment with urology.  Patient's last UTI was 03/20/2022.  Patient had tried Macrobid and Bactrim.  Urine dip today shows protein and large leukocytes.  We will start treatment with Keflex.  Discussed pushing fluids.  Patient states she is going to call urology office to see if she can be seen sooner.  Discussed reasons to seek emergency care.  Verbalized understanding.  Relevant past medical, surgical, family and social history reviewed and updated as indicated. Interim medical history since our last visit reviewed. Allergies and medications reviewed and updated.  Review of Systems  Constitutional: Negative for fever or weight change.  Respiratory: Negative for cough and shortness of breath.   Cardiovascular: Negative for chest pain or palpitations.  Gastrointestinal: Negative for abdominal pain, no bowel changes.  GU: Positive for urinary frequency, urgency, dysuria Musculoskeletal: Negative for gait problem or joint swelling.  Skin: Negative for rash.  Neurological: Negative for dizziness or headache.  No other specific complaints in a complete review of systems (except as listed in HPI above).      Objective:    BP 124/72   Pulse 66   Temp 98 F (36.7 C)   Resp 16   Ht '5\' 2"'$  (1.575 m)   Wt 125 lb 1.6 oz (56.7 kg)   SpO2 98%    BMI 22.88 kg/m   Wt Readings from Last 3 Encounters:  04/12/22 125 lb 1.6 oz (56.7 kg)  03/20/22 124 lb (56.2 kg)  08/30/21 121 lb (54.9 kg)    Physical Exam  Constitutional: Patient appears well-developed and well-nourished. No distress.  HEENT: head atraumatic, normocephalic, pupils equal and reactive to light, neck supple Cardiovascular: Normal rate, regular rhythm and normal heart sounds.  No murmur heard. No BLE edema. Pulmonary/Chest: Effort normal and breath sounds normal. No respiratory distress. Abdominal: Soft.  There is no tenderness.No  CVA tenderness Psychiatric: Patient has a normal mood and affect. behavior is normal. Judgment and thought content normal.  Results for orders placed or performed in visit on 04/12/22  POCT urinalysis dipstick  Result Value Ref Range   Color, UA yellow    Clarity, UA clear    Glucose, UA Negative Negative   Bilirubin, UA neg    Ketones, UA neg    Spec Grav, UA 1.020 1.010 - 1.025   Blood, UA large    pH, UA 6.0 5.0 - 8.0   Protein, UA Positive (A) Negative   Urobilinogen, UA 0.2 0.2 or 1.0 E.U./dL   Nitrite, UA neg    Leukocytes, UA Large (3+) (A) Negative   Appearance clear    Odor normal       Assessment & Plan:   Problem List Items  Addressed This Visit   None Visit Diagnoses     Burning with urination    -  Primary   Urine dip and urine culture.  We will start patient on Keflex 500 mg 2 times a day for 5 days.  Patient is going to call urology to see if she can be seen soone   Relevant Medications   cephALEXin (KEFLEX) 500 MG capsule   Other Relevant Orders   POCT urinalysis dipstick (Completed)   Urine Culture        Follow up plan: Return if symptoms worsen or fail to improve, for follow up.

## 2022-04-14 LAB — URINE CULTURE
MICRO NUMBER:: 13680364
SPECIMEN QUALITY:: ADEQUATE

## 2022-04-15 ENCOUNTER — Other Ambulatory Visit: Payer: Self-pay | Admitting: Obstetrics

## 2022-04-15 ENCOUNTER — Other Ambulatory Visit: Payer: Self-pay | Admitting: Nurse Practitioner

## 2022-04-15 DIAGNOSIS — N3000 Acute cystitis without hematuria: Secondary | ICD-10-CM

## 2022-04-15 MED ORDER — SULFAMETHOXAZOLE-TRIMETHOPRIM 800-160 MG PO TABS: 1.0000 | ORAL_TABLET | Freq: Two times a day (BID) | ORAL | 0 refills | Status: DC

## 2022-04-15 MED ORDER — SULFAMETHOXAZOLE-TRIMETHOPRIM 800-160 MG PO TABS: 1.0000 | ORAL_TABLET | Freq: Two times a day (BID) | ORAL | 0 refills | Status: AC

## 2022-04-15 NOTE — Progress Notes (Signed)
Patient has contacted the office through [facility] requesting a renewal of her antibiotic for UTI; her symptoms have returned.  I renewed her Bactrim.  She has an appointment with a Urologist in a few weeks.  Imagene Riches, CNM  04/15/2022 10:56 PM

## 2022-04-16 ENCOUNTER — Ambulatory Visit: Payer: BC Managed Care – PPO | Admitting: Nurse Practitioner

## 2022-04-19 ENCOUNTER — Ambulatory Visit: Payer: BC Managed Care – PPO | Admitting: Obstetrics and Gynecology

## 2022-04-22 DIAGNOSIS — Z87440 Personal history of urinary (tract) infections: Secondary | ICD-10-CM | POA: Diagnosis not present

## 2022-04-23 DIAGNOSIS — J3081 Allergic rhinitis due to animal (cat) (dog) hair and dander: Secondary | ICD-10-CM | POA: Diagnosis not present

## 2022-04-23 DIAGNOSIS — J3089 Other allergic rhinitis: Secondary | ICD-10-CM | POA: Diagnosis not present

## 2022-04-23 DIAGNOSIS — J301 Allergic rhinitis due to pollen: Secondary | ICD-10-CM | POA: Diagnosis not present

## 2022-04-30 DIAGNOSIS — J3089 Other allergic rhinitis: Secondary | ICD-10-CM | POA: Diagnosis not present

## 2022-04-30 DIAGNOSIS — J301 Allergic rhinitis due to pollen: Secondary | ICD-10-CM | POA: Diagnosis not present

## 2022-04-30 DIAGNOSIS — J3081 Allergic rhinitis due to animal (cat) (dog) hair and dander: Secondary | ICD-10-CM | POA: Diagnosis not present

## 2022-05-07 DIAGNOSIS — J3081 Allergic rhinitis due to animal (cat) (dog) hair and dander: Secondary | ICD-10-CM | POA: Diagnosis not present

## 2022-05-07 DIAGNOSIS — J301 Allergic rhinitis due to pollen: Secondary | ICD-10-CM | POA: Diagnosis not present

## 2022-05-07 DIAGNOSIS — J3089 Other allergic rhinitis: Secondary | ICD-10-CM | POA: Diagnosis not present

## 2022-05-17 DIAGNOSIS — J301 Allergic rhinitis due to pollen: Secondary | ICD-10-CM | POA: Diagnosis not present

## 2022-05-17 DIAGNOSIS — J3081 Allergic rhinitis due to animal (cat) (dog) hair and dander: Secondary | ICD-10-CM | POA: Diagnosis not present

## 2022-05-17 DIAGNOSIS — J3089 Other allergic rhinitis: Secondary | ICD-10-CM | POA: Diagnosis not present

## 2022-05-24 DIAGNOSIS — J3089 Other allergic rhinitis: Secondary | ICD-10-CM | POA: Diagnosis not present

## 2022-05-24 DIAGNOSIS — J301 Allergic rhinitis due to pollen: Secondary | ICD-10-CM | POA: Diagnosis not present

## 2022-05-24 DIAGNOSIS — J3081 Allergic rhinitis due to animal (cat) (dog) hair and dander: Secondary | ICD-10-CM | POA: Diagnosis not present

## 2022-06-11 DIAGNOSIS — J3089 Other allergic rhinitis: Secondary | ICD-10-CM | POA: Diagnosis not present

## 2022-06-11 DIAGNOSIS — J301 Allergic rhinitis due to pollen: Secondary | ICD-10-CM | POA: Diagnosis not present

## 2022-06-11 DIAGNOSIS — J3081 Allergic rhinitis due to animal (cat) (dog) hair and dander: Secondary | ICD-10-CM | POA: Diagnosis not present

## 2022-06-20 DIAGNOSIS — J3081 Allergic rhinitis due to animal (cat) (dog) hair and dander: Secondary | ICD-10-CM | POA: Diagnosis not present

## 2022-06-20 DIAGNOSIS — J301 Allergic rhinitis due to pollen: Secondary | ICD-10-CM | POA: Diagnosis not present

## 2022-06-20 DIAGNOSIS — J3089 Other allergic rhinitis: Secondary | ICD-10-CM | POA: Diagnosis not present

## 2022-06-28 DIAGNOSIS — J3089 Other allergic rhinitis: Secondary | ICD-10-CM | POA: Diagnosis not present

## 2022-06-28 DIAGNOSIS — J301 Allergic rhinitis due to pollen: Secondary | ICD-10-CM | POA: Diagnosis not present

## 2022-06-28 DIAGNOSIS — J3081 Allergic rhinitis due to animal (cat) (dog) hair and dander: Secondary | ICD-10-CM | POA: Diagnosis not present

## 2022-07-03 DIAGNOSIS — F411 Generalized anxiety disorder: Secondary | ICD-10-CM | POA: Diagnosis not present

## 2022-07-04 DIAGNOSIS — J301 Allergic rhinitis due to pollen: Secondary | ICD-10-CM | POA: Diagnosis not present

## 2022-07-04 DIAGNOSIS — J3081 Allergic rhinitis due to animal (cat) (dog) hair and dander: Secondary | ICD-10-CM | POA: Diagnosis not present

## 2022-07-04 DIAGNOSIS — J3089 Other allergic rhinitis: Secondary | ICD-10-CM | POA: Diagnosis not present

## 2022-07-11 DIAGNOSIS — J3081 Allergic rhinitis due to animal (cat) (dog) hair and dander: Secondary | ICD-10-CM | POA: Diagnosis not present

## 2022-07-11 DIAGNOSIS — J301 Allergic rhinitis due to pollen: Secondary | ICD-10-CM | POA: Diagnosis not present

## 2022-07-11 DIAGNOSIS — J3089 Other allergic rhinitis: Secondary | ICD-10-CM | POA: Diagnosis not present

## 2022-07-16 DIAGNOSIS — J3081 Allergic rhinitis due to animal (cat) (dog) hair and dander: Secondary | ICD-10-CM | POA: Diagnosis not present

## 2022-07-16 DIAGNOSIS — J3089 Other allergic rhinitis: Secondary | ICD-10-CM | POA: Diagnosis not present

## 2022-07-16 DIAGNOSIS — J301 Allergic rhinitis due to pollen: Secondary | ICD-10-CM | POA: Diagnosis not present

## 2022-07-26 DIAGNOSIS — R8271 Bacteriuria: Secondary | ICD-10-CM | POA: Diagnosis not present

## 2022-07-30 DIAGNOSIS — J3081 Allergic rhinitis due to animal (cat) (dog) hair and dander: Secondary | ICD-10-CM | POA: Diagnosis not present

## 2022-07-30 DIAGNOSIS — J3089 Other allergic rhinitis: Secondary | ICD-10-CM | POA: Diagnosis not present

## 2022-07-30 DIAGNOSIS — J301 Allergic rhinitis due to pollen: Secondary | ICD-10-CM | POA: Diagnosis not present

## 2022-08-02 DIAGNOSIS — Z23 Encounter for immunization: Secondary | ICD-10-CM | POA: Diagnosis not present

## 2022-08-06 DIAGNOSIS — J301 Allergic rhinitis due to pollen: Secondary | ICD-10-CM | POA: Diagnosis not present

## 2022-08-06 DIAGNOSIS — J3081 Allergic rhinitis due to animal (cat) (dog) hair and dander: Secondary | ICD-10-CM | POA: Diagnosis not present

## 2022-08-07 DIAGNOSIS — J3089 Other allergic rhinitis: Secondary | ICD-10-CM | POA: Diagnosis not present

## 2022-08-09 DIAGNOSIS — J301 Allergic rhinitis due to pollen: Secondary | ICD-10-CM | POA: Diagnosis not present

## 2022-08-09 DIAGNOSIS — J3081 Allergic rhinitis due to animal (cat) (dog) hair and dander: Secondary | ICD-10-CM | POA: Diagnosis not present

## 2022-08-09 DIAGNOSIS — J3089 Other allergic rhinitis: Secondary | ICD-10-CM | POA: Diagnosis not present

## 2022-08-20 DIAGNOSIS — J301 Allergic rhinitis due to pollen: Secondary | ICD-10-CM | POA: Diagnosis not present

## 2022-08-20 DIAGNOSIS — J3081 Allergic rhinitis due to animal (cat) (dog) hair and dander: Secondary | ICD-10-CM | POA: Diagnosis not present

## 2022-08-20 DIAGNOSIS — J3089 Other allergic rhinitis: Secondary | ICD-10-CM | POA: Diagnosis not present

## 2022-08-27 DIAGNOSIS — J3081 Allergic rhinitis due to animal (cat) (dog) hair and dander: Secondary | ICD-10-CM | POA: Diagnosis not present

## 2022-08-27 DIAGNOSIS — J3089 Other allergic rhinitis: Secondary | ICD-10-CM | POA: Diagnosis not present

## 2022-08-27 DIAGNOSIS — J301 Allergic rhinitis due to pollen: Secondary | ICD-10-CM | POA: Diagnosis not present

## 2022-08-29 ENCOUNTER — Ambulatory Visit: Payer: Self-pay | Admitting: Nurse Practitioner

## 2022-08-30 ENCOUNTER — Ambulatory Visit (INDEPENDENT_AMBULATORY_CARE_PROVIDER_SITE_OTHER): Payer: Self-pay | Admitting: Nurse Practitioner

## 2022-08-30 ENCOUNTER — Encounter: Payer: Self-pay | Admitting: Nurse Practitioner

## 2022-08-30 VITALS — BP 106/60 | HR 53 | Temp 98.3°F | Ht 65.0 in | Wt 123.4 lb

## 2022-08-30 DIAGNOSIS — Z7184 Encounter for health counseling related to travel: Secondary | ICD-10-CM

## 2022-08-30 MED ORDER — AZITHROMYCIN 250 MG PO TABS: ORAL_TABLET | ORAL | 0 refills | Status: AC

## 2022-08-30 NOTE — Progress Notes (Signed)
[facility] [address], [place] [address]   Office Visit Note  Patient Name: Veronica Gibson Date of Birth [id number]  Medical Record number [id number]  Date of Service: 08/30/2022  Chief Complaint  Patient presents with   Travel Consult    Needs Travel meds. Going to [place]     HPI Patient is here reporting she will be taking a trip to [place] from Jan 3rd- 95MW.  She is requesting a Zpak incase she gets travelers diarrhea.  She has been on this trip multiple times and the Zpak she has is VERY expired at this time.    Current Medication:  Outpatient Encounter Medications as of 08/30/2022  Medication Sig Note   azelastine (ASTELIN) 0.1 % nasal spray SMARTSIG:1-2 Spray(s) Both Nares Twice Daily    azithromycin (ZITHROMAX) 250 MG tablet Take 2 tablets on day 1, then 1 tablet daily on days 2 through 5    cetirizine (ZYRTEC) 10 MG tablet Take 10 mg by mouth daily.    fluticasone (FLONASE) 50 MCG/ACT nasal spray PLACE 1-2 SPRAY IN EACH NOSTRIL ONCE A DAY 02/15/2016: Received from: External Pharmacy   spironolactone (ALDACTONE) 50 MG tablet Take 50 mg by mouth 2 (two) times daily.    sulfamethoxazole-trimethoprim (BACTRIM) 400-80 MG tablet Take 1 tablet by mouth daily as needed.    valACYclovir (VALTREX) 500 MG tablet Take 1 tablet (500 mg total) by mouth daily.    fexofenadine (ALLEGRA) 180 MG tablet  (Patient not taking: Reported on 08/30/2022)    levonorgestrel (MIRENA, 52 MG,) 20 MCG/24HR IUD 1 Intra Uterine Device (1 each total) by Intrauterine route once for 1 dose.    loratadine (CLARITIN) 10 MG tablet Take 10 mg by mouth 2 (two) times daily.  (Patient not taking: Reported on 08/30/2022)    No facility-administered encounter medications on file as of 08/30/2022.      Medical History: Past Medical History:  Diagnosis Date   Anxiety    Genital herpes 03/2020   confirmed by HSV 2 IgG   Head ache    History of mammogram 12/13/2013   CAT 2   History  of Papanicolaou smear of cervix 04/07/2014   -/-   Rupture Achilles tendon 08/14/2017   Right   Seasonal allergies    Vaccine for human papilloma virus (HPV) types 6, 11, 16, and 18 administered      Vital Signs: BP 106/60 (BP Location: Left Arm, Patient Position: Sitting, Cuff Size: Normal)   Pulse (!) 53   Temp 98.3 F (36.8 C) (Tympanic)   Ht '5\' 5"'$  (1.651 m)   Wt 123 lb 6.4 oz (56 kg)   SpO2 99%   BMI 20.53 kg/m    Review of Systems  Constitutional:  Negative for chills, fatigue and fever.    Physical Exam Vitals and nursing note reviewed.  Constitutional:      Appearance: Normal appearance.  Neurological:     Mental Status: She is alert.     Assessment/Plan: 1. Travel advice encounter Take Zpak as instructed if you get sick.   - azithromycin (ZITHROMAX) 250 MG tablet; Take 2 tablets on day 1, then 1 tablet daily on days 2 through 5  Dispense: 6 tablet; Refill: 0     General Counseling: Veronica Gibson verbalizes understanding of the findings of todays visit and agrees with plan of treatment. I have discussed any further diagnostic evaluation that may be needed or ordered today. We also reviewed her medications today. she  has been encouraged to call the office with any questions or concerns that should arise related to todays visit.   No orders of the defined types were placed in this encounter.   Meds ordered this encounter  Medications   azithromycin (ZITHROMAX) 250 MG tablet    Sig: Take 2 tablets on day 1, then 1 tablet daily on days 2 through 5    Dispense:  6 tablet    Refill:  0    Time spent:20 Minutes    Kendell Bane AGNP-C Nurse Practitioner

## 2022-09-01 ENCOUNTER — Other Ambulatory Visit: Payer: Self-pay | Admitting: Obstetrics and Gynecology

## 2022-09-01 DIAGNOSIS — A6004 Herpesviral vulvovaginitis: Secondary | ICD-10-CM

## 2022-09-03 ENCOUNTER — Other Ambulatory Visit: Payer: Self-pay | Admitting: Obstetrics and Gynecology

## 2022-09-03 ENCOUNTER — Telehealth: Payer: Self-pay

## 2022-09-03 DIAGNOSIS — J301 Allergic rhinitis due to pollen: Secondary | ICD-10-CM | POA: Diagnosis not present

## 2022-09-03 DIAGNOSIS — J3081 Allergic rhinitis due to animal (cat) (dog) hair and dander: Secondary | ICD-10-CM | POA: Diagnosis not present

## 2022-09-03 DIAGNOSIS — A6004 Herpesviral vulvovaginitis: Secondary | ICD-10-CM

## 2022-09-03 DIAGNOSIS — J3089 Other allergic rhinitis: Secondary | ICD-10-CM | POA: Diagnosis not present

## 2022-09-03 MED ORDER — VALACYCLOVIR HCL 500 MG PO TABS: 500.0000 mg | ORAL_TABLET | Freq: Every day | ORAL | 0 refills | Status: DC

## 2022-09-03 NOTE — Telephone Encounter (Signed)
Called pt, no answer, left detailed msg Rx sent to pharmacy.

## 2022-09-03 NOTE — Telephone Encounter (Signed)
Rx RF eRxd.  

## 2022-09-03 NOTE — Telephone Encounter (Signed)
Pt calling; has 11 valacyclovir pills left; has scheduled appt for 1/25th; can she have a 89msupply sent in?  [phone]

## 2022-09-03 NOTE — Progress Notes (Signed)
Rx RF valtrex till 1/24 annual

## 2022-09-10 DIAGNOSIS — J301 Allergic rhinitis due to pollen: Secondary | ICD-10-CM | POA: Diagnosis not present

## 2022-09-10 DIAGNOSIS — J3081 Allergic rhinitis due to animal (cat) (dog) hair and dander: Secondary | ICD-10-CM | POA: Diagnosis not present

## 2022-09-10 DIAGNOSIS — J3089 Other allergic rhinitis: Secondary | ICD-10-CM | POA: Diagnosis not present

## 2022-09-19 DIAGNOSIS — J3081 Allergic rhinitis due to animal (cat) (dog) hair and dander: Secondary | ICD-10-CM | POA: Diagnosis not present

## 2022-09-19 DIAGNOSIS — J3089 Other allergic rhinitis: Secondary | ICD-10-CM | POA: Diagnosis not present

## 2022-09-19 DIAGNOSIS — J301 Allergic rhinitis due to pollen: Secondary | ICD-10-CM | POA: Diagnosis not present

## 2022-09-24 DIAGNOSIS — J301 Allergic rhinitis due to pollen: Secondary | ICD-10-CM | POA: Diagnosis not present

## 2022-09-24 DIAGNOSIS — J3081 Allergic rhinitis due to animal (cat) (dog) hair and dander: Secondary | ICD-10-CM | POA: Diagnosis not present

## 2022-09-24 DIAGNOSIS — J3089 Other allergic rhinitis: Secondary | ICD-10-CM | POA: Diagnosis not present

## 2022-10-15 DIAGNOSIS — J301 Allergic rhinitis due to pollen: Secondary | ICD-10-CM | POA: Diagnosis not present

## 2022-10-15 DIAGNOSIS — J3081 Allergic rhinitis due to animal (cat) (dog) hair and dander: Secondary | ICD-10-CM | POA: Diagnosis not present

## 2022-10-15 DIAGNOSIS — J3089 Other allergic rhinitis: Secondary | ICD-10-CM | POA: Diagnosis not present

## 2022-10-15 NOTE — Progress Notes (Unsigned)
No chief complaint on file.    HPI:      Veronica Gibson is a 47 y.o. [phone] who LMP was No LMP recorded. (Menstrual status: IUD)., presents today for her annual examination.  Her menses are absent with IUD, placed 3/19. Has occas light spotting, no dysmen.  Sex activity: single partner, contraception - IUD. Mirena placed 11/25/17.  Last Pap: 08/30/21 and 05/18/20 Results were: ASCUS /neg HPV DNA; repeat due after 3 yrs per ASCCP guidelines but will do this yr for pt reassurance.  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 Taking macrobid postcoitally with sx control. Doing well. Had yeast vag 11/22 with sx relief after diflucan tx.  Last mammo: 11/26/21  Results were normal, repeat in 12 months There is no FH of breast cancer. There is no FH of ovarian cancer. The patient does not do self-breast exams. Pt with FH uterine cancer in her pat aunt and PGM, genetic testing not done. Pt will clarify dx and ages.   Tobacco use: The patient denies current or previous tobacco use. Alcohol use: social drinker  No drug use. Exercise: very active  She does get adequate calcium and Vitamin D in her diet. Labs with PCP  Past Medical History:  Diagnosis Date   Anxiety    Genital herpes 03/2020   confirmed by HSV 2 IgG   Head ache    History of mammogram 12/13/2013   CAT 2   History of Papanicolaou smear of cervix 04/07/2014   -/-   Rupture Achilles tendon 08/14/2017   Right   Seasonal allergies    Vaccine for human papilloma virus (HPV) types 6, 11, 16, and 18 administered     Past Surgical History:  Procedure Laterality Date   ACHILLES TENDON REPAIR  08/18/2017   WISDOM TOOTH EXTRACTION      Family History  Problem Relation Age of Onset   Uterine cancer Paternal Grandmother 24   Coronary artery disease Paternal Grandfather        CABG   Cervical cancer Maternal Aunt        died at 28    Cervical cancer Paternal Aunt 16   Pancreatic cancer Maternal Great-grandmother 84   Uterine cancer Other 32   Colon cancer Other     Social History   Socioeconomic History   Marital status: Divorced    Spouse name: Not on file   Number of children: 2   Years of education: 18   Highest education level: Not on file  Occupational History   Occupation: Designer, jewellery: [employer]  Tobacco Use   Smoking status: Never   Smokeless tobacco: Never  Vaping Use   Vaping Use: Never used  Substance and Sexual Activity   Alcohol use: Yes    Comment: OCC   Drug use: No   Sexual activity: Yes    Birth control/protection: I.U.D.    Comment: Mirena  Other Topics Concern   Not on file  Social History Narrative   Not on file   Social Determinants of Health   Financial Resource Strain: Not on file  Food Insecurity: Not on file  Transportation Needs: Not on file  Physical Activity: Inactive (10/30/2017)   Exercise Vital Sign    Days of Exercise per Week: 0 days    Minutes of Exercise per Session: 0 min  Stress: No  Stress Concern Present (10/30/2017)   [place]    Feeling of Stress : Only a little  Social Connections: Not on file  Intimate Partner Violence: Not on file     Current Outpatient Medications:    azelastine (ASTELIN) 0.1 % nasal spray, SMARTSIG:1-2 Spray(s) Both Nares Twice Daily, Disp: , Rfl:    cetirizine (ZYRTEC) 10 MG tablet, Take 10 mg by mouth daily., Disp: , Rfl:    fexofenadine (ALLEGRA) 180 MG tablet, , Disp: , Rfl:    fluticasone (FLONASE) 50 MCG/ACT nasal spray, PLACE 1-2 SPRAY IN EACH NOSTRIL ONCE A DAY, Disp: , Rfl: 3   levonorgestrel (MIRENA, 52 MG,) 20 MCG/24HR IUD, 1 Intra Uterine Device (1 each total) by Intrauterine route once for 1 dose., Disp: 1 Intra Uterine Device, Rfl: 0   loratadine (CLARITIN) 10 MG tablet, Take 10 mg by mouth 2 (two) times daily.  (Patient not taking:  Reported on 08/30/2022), Disp: , Rfl:    spironolactone (ALDACTONE) 50 MG tablet, Take 50 mg by mouth 2 (two) times daily., Disp: , Rfl:    sulfamethoxazole-trimethoprim (BACTRIM) 400-80 MG tablet, Take 1 tablet by mouth daily as needed., Disp: , Rfl:    valACYclovir (VALTREX) 500 MG tablet, Take 1 tablet (500 mg total) by mouth daily., Disp: 90 tablet, Rfl: 0  ROS:  Review of Systems  Constitutional:  Negative for fatigue, fever and unexpected weight change.  Respiratory:  Negative for cough, shortness of breath and wheezing.   Cardiovascular:  Negative for chest pain, palpitations and leg swelling.  Gastrointestinal:  Negative for blood in stool, constipation, diarrhea, nausea and vomiting.  Endocrine: Negative for cold intolerance, heat intolerance and polyuria.  Genitourinary:  Negative for dyspareunia, dysuria, flank pain, frequency, genital sores, hematuria, menstrual problem, pelvic pain, urgency, vaginal bleeding, vaginal discharge and vaginal pain.  Musculoskeletal:  Negative for back pain, joint swelling and myalgias.  Skin:  Negative for rash.  Neurological:  Negative for dizziness, syncope, light-headedness, numbness and headaches.  Hematological:  Negative for adenopathy.  Psychiatric/Behavioral:  Negative for agitation, confusion, dysphoric mood, sleep disturbance and suicidal ideas. The patient is not nervous/anxious.      Objective: There were no vitals taken for this visit.   Physical Exam Constitutional:      Appearance: She is well-developed.  Genitourinary:     Vulva normal.     Right Labia: No rash, tenderness or lesions.    Left Labia: No tenderness, lesions or rash.    No vaginal discharge, erythema or tenderness.      Right Adnexa: not tender and no mass present.    Left Adnexa: not tender and no mass present.    No cervical friability or polyp.     IUD strings visualized.     Uterus is not enlarged or tender.  Breasts:    Right: No mass, nipple  discharge, skin change or tenderness.     Left: No mass, nipple discharge, skin change or tenderness.  Neck:     Thyroid: No thyromegaly.  Cardiovascular:     Rate and Rhythm: Normal rate and regular rhythm.     Heart sounds: Normal heart sounds. No murmur heard. Pulmonary:     Effort: Pulmonary effort is normal.     Breath sounds: Normal breath sounds.  Abdominal:     Palpations: Abdomen is soft.     Tenderness: There is no abdominal tenderness. There is no guarding or rebound.  Musculoskeletal:  General: Normal range of motion.     Cervical back: Normal range of motion.  Lymphadenopathy:     Cervical: No cervical adenopathy.  Neurological:     General: No focal deficit present.     Mental Status: She is alert and oriented to person, place, and time.     Cranial Nerves: No cranial nerve deficit.  Skin:    General: Skin is warm and dry.  Psychiatric:        Mood and Affect: Mood normal.        Behavior: Behavior normal.        Thought Content: Thought content normal.        Judgment: Judgment normal.  Vitals reviewed.     Assessment/Plan: Encounter for annual routine gynecological examination  Cervical cancer screening - Plan: Cytology - PAP  Screening for HPV (human papillomavirus) - Plan: Cytology - PAP  ASCUS of cervix with negative high risk HPV - Plan: Cytology - PAP; repeat pap today.   Encounter for routine checking of intrauterine contraceptive device (IUD)--IUD strings in cx os; has 8 yr indication now.  Encounter for screening mammogram for malignant neoplasm of breast - Plan: MM 3D SCREEN BREAST BILATERAL; pt to sched mammo  Herpes simplex vulvovaginitis - Plan: valACYclovir (VALTREX) 500 MG tablet; Rx RF. Takes daily, discussed prn.   Postcoital UTI - Plan: nitrofurantoin, macrocrystal-monohydrate, (MACROBID) 100 MG capsule; Rx RF.   Family history of uterine cancer--pt to clarify dx and ages. May qualify for cancer genetic testing.    No orders  of the defined types were placed in this encounter.              GYN counsel mammography screening, adequate intake of calcium and vitamin D    F/U  No follow-ups on file.  Veronica Tarkowski B. Shawndrea Rutkowski, PA-C 10/15/2022 4:33 PM  09/03/21 Pt was able to clarify FH and doesn't qualify for cancer genetic testing.  Family History  Problem Relation Age of Onset   Uterine cancer Paternal Grandmother 65   Coronary artery disease Paternal Grandfather        CABG   Cervical cancer Maternal Aunt        died at 75   Cervical cancer Paternal Aunt 23   Pancreatic cancer Maternal Great-grandmother 6   Uterine cancer Other 49   Colon cancer Other

## 2022-10-17 ENCOUNTER — Other Ambulatory Visit (HOSPITAL_COMMUNITY)
Admission: RE | Admit: 2022-10-17 | Discharge: 2022-10-17 | Disposition: A | Discharge status: Home / Self Care | Payer: BC Managed Care – PPO | Source: Ambulatory Visit | Attending: Obstetrics and Gynecology | Admitting: Obstetrics and Gynecology

## 2022-10-17 ENCOUNTER — Encounter: Payer: Self-pay | Admitting: Obstetrics and Gynecology

## 2022-10-17 ENCOUNTER — Ambulatory Visit (INDEPENDENT_AMBULATORY_CARE_PROVIDER_SITE_OTHER): Payer: BC Managed Care – PPO | Admitting: Obstetrics and Gynecology

## 2022-10-17 VITALS — BP 100/64 | Ht 66.0 in | Wt 125.0 lb

## 2022-10-17 DIAGNOSIS — Z124 Encounter for screening for malignant neoplasm of cervix: Secondary | ICD-10-CM | POA: Diagnosis not present

## 2022-10-17 DIAGNOSIS — R87610 Atypical squamous cells of undetermined significance on cytologic smear of cervix (ASC-US): Secondary | ICD-10-CM | POA: Diagnosis not present

## 2022-10-17 DIAGNOSIS — A6004 Herpesviral vulvovaginitis: Secondary | ICD-10-CM | POA: Diagnosis not present

## 2022-10-17 DIAGNOSIS — Z1211 Encounter for screening for malignant neoplasm of colon: Secondary | ICD-10-CM

## 2022-10-17 DIAGNOSIS — Z01411 Encounter for gynecological examination (general) (routine) with abnormal findings: Secondary | ICD-10-CM | POA: Diagnosis not present

## 2022-10-17 DIAGNOSIS — Z1151 Encounter for screening for human papillomavirus (HPV): Secondary | ICD-10-CM

## 2022-10-17 DIAGNOSIS — N390 Urinary tract infection, site not specified: Secondary | ICD-10-CM

## 2022-10-17 DIAGNOSIS — Z30431 Encounter for routine checking of intrauterine contraceptive device: Secondary | ICD-10-CM

## 2022-10-17 DIAGNOSIS — Z1231 Encounter for screening mammogram for malignant neoplasm of breast: Secondary | ICD-10-CM

## 2022-10-17 DIAGNOSIS — Z01419 Encounter for gynecological examination (general) (routine) without abnormal findings: Secondary | ICD-10-CM

## 2022-10-17 MED ORDER — VALACYCLOVIR HCL 500 MG PO TABS: 500.0000 mg | ORAL_TABLET | Freq: Every day | ORAL | 3 refills | Status: DC

## 2022-10-17 NOTE — Patient Instructions (Signed)
I value your feedback and you entrusting us with your care. If you get a [facility] patient survey, I would appreciate you taking the time to let us know about your experience today. Thank you!  [facility]: [phone]      

## 2022-10-21 ENCOUNTER — Telehealth: Payer: Self-pay

## 2022-10-21 DIAGNOSIS — Z1211 Encounter for screening for malignant neoplasm of colon: Secondary | ICD-10-CM

## 2022-10-21 NOTE — Telephone Encounter (Signed)
Patient is calling to schedule her colonoscopy that we received a referral for

## 2022-10-22 ENCOUNTER — Other Ambulatory Visit: Payer: Self-pay

## 2022-10-22 DIAGNOSIS — J3089 Other allergic rhinitis: Secondary | ICD-10-CM | POA: Diagnosis not present

## 2022-10-22 DIAGNOSIS — Z1211 Encounter for screening for malignant neoplasm of colon: Secondary | ICD-10-CM

## 2022-10-22 DIAGNOSIS — J301 Allergic rhinitis due to pollen: Secondary | ICD-10-CM | POA: Diagnosis not present

## 2022-10-22 DIAGNOSIS — J3081 Allergic rhinitis due to animal (cat) (dog) hair and dander: Secondary | ICD-10-CM | POA: Diagnosis not present

## 2022-10-22 MED ORDER — NA SULFATE-K SULFATE-MG SULF 17.5-3.13-1.6 GM/177ML PO SOLN: 1.0000 | Freq: Once | ORAL | 0 refills | Status: AC

## 2022-10-22 NOTE — Telephone Encounter (Signed)
Gastroenterology Pre-Procedure Review  Request Date: 11/11/22 Requesting Physician: Dr. Marius Ditch  PATIENT REVIEW QUESTIONS: The patient responded to the following health history questions as indicated:    1. Are you having any GI issues? no 2. Do you have a personal history of Polyps? no 3. Do you have a family history of Colon Cancer or Polyps? no 4. Diabetes Mellitus? no 5. Joint replacements in the past 12 months?no 6. Major health problems in the past 3 months?no 7. Any artificial heart valves, MVP, or defibrillator?no    MEDICATIONS & ALLERGIES:    Patient reports the following regarding taking any anticoagulation/antiplatelet therapy:   Plavix, Coumadin, Eliquis, Xarelto, Lovenox, Pradaxa, Brilinta, or Effient? no Aspirin? no  Patient confirms/reports the following medications:  Current Outpatient Medications  Medication Sig Dispense Refill   azelastine (ASTELIN) 0.1 % nasal spray SMARTSIG:1-2 Spray(s) Both Nares Twice Daily     cetirizine (ZYRTEC) 10 MG tablet Take 10 mg by mouth daily.     fexofenadine (ALLEGRA) 180 MG tablet      fluticasone (FLONASE) 50 MCG/ACT nasal spray PLACE 1-2 SPRAY IN EACH NOSTRIL ONCE A DAY  3   levonorgestrel (MIRENA, 52 MG,) 20 MCG/24HR IUD 1 Intra Uterine Device (1 each total) by Intrauterine route once for 1 dose. 1 Intra Uterine Device 0   loratadine (CLARITIN) 10 MG tablet Take 10 mg by mouth 2 (two) times daily.     spironolactone (ALDACTONE) 50 MG tablet Take 50 mg by mouth 2 (two) times daily.     sulfamethoxazole-trimethoprim (BACTRIM) 400-80 MG tablet Take 1 tablet by mouth daily as needed.     valACYclovir (VALTREX) 500 MG tablet Take 1 tablet (500 mg total) by mouth daily. 90 tablet 3   No current facility-administered medications for this visit.    Patient confirms/reports the following allergies:  Allergies  Allergen Reactions   Dust Mite Mixed Allergen Ext [Mite (D. Farinae)] Other (See Comments)    And mold   Other     Other  reaction(s): Other (See Comments) Trees, dust   Pollen Extract     Other reaction(s): Unknown   Shellfish Allergy     No orders of the defined types were placed in this encounter.   AUTHORIZATION INFORMATION Primary Insurance: 1D#: Group #:  Secondary Insurance: 1D#: Group #:  SCHEDULE INFORMATION: Date: 11/11/22 Time: Location: [facility]

## 2022-10-23 LAB — CYTOLOGY - PAP
Comment: NEGATIVE
Diagnosis: UNDETERMINED — AB
High risk HPV: NEGATIVE

## 2022-10-31 DIAGNOSIS — J3089 Other allergic rhinitis: Secondary | ICD-10-CM | POA: Diagnosis not present

## 2022-10-31 DIAGNOSIS — J301 Allergic rhinitis due to pollen: Secondary | ICD-10-CM | POA: Diagnosis not present

## 2022-10-31 DIAGNOSIS — J3081 Allergic rhinitis due to animal (cat) (dog) hair and dander: Secondary | ICD-10-CM | POA: Diagnosis not present

## 2022-11-05 DIAGNOSIS — J3089 Other allergic rhinitis: Secondary | ICD-10-CM | POA: Diagnosis not present

## 2022-11-05 DIAGNOSIS — J301 Allergic rhinitis due to pollen: Secondary | ICD-10-CM | POA: Diagnosis not present

## 2022-11-05 DIAGNOSIS — J3081 Allergic rhinitis due to animal (cat) (dog) hair and dander: Secondary | ICD-10-CM | POA: Diagnosis not present

## 2022-11-08 NOTE — Anesthesia Preprocedure Evaluation (Signed)
Anesthesia Evaluation  Patient identified by MRN, date of birth, ID band Patient awake    Reviewed: Allergy & Precautions, NPO status , Patient's Chart, lab work & pertinent test results  History of Anesthesia Complications Negative for: history of anesthetic complications  Airway Mallampati: I   Neck ROM: Full    Dental no notable dental hx.    Pulmonary neg pulmonary ROS   Pulmonary exam normal breath sounds clear to auscultation       Cardiovascular Exercise Tolerance: Good negative cardio ROS Normal cardiovascular exam Rhythm:Regular Rate:Normal     Neuro/Psych  Headaches PSYCHIATRIC DISORDERS Anxiety        GI/Hepatic negative GI ROS,,,  Endo/Other  negative endocrine ROS    Renal/GU negative Renal ROS     Musculoskeletal   Abdominal   Peds  Hematology negative hematology ROS (+)   Anesthesia Other Findings   Reproductive/Obstetrics                             Anesthesia Physical Anesthesia Plan  ASA: 2  Anesthesia Plan: General   Post-op Pain Management:    Induction: Intravenous  PONV Risk Score and Plan: 3 and Propofol infusion, TIVA and Treatment may vary due to age or medical condition  Airway Management Planned: Natural Airway  Additional Equipment:   Intra-op Plan:   Post-operative Plan:   Informed Consent: I have reviewed the patients History and Physical, chart, labs and discussed the procedure including the risks, benefits and alternatives for the proposed anesthesia with the patient or authorized representative who has indicated his/her understanding and acceptance.       Plan Discussed with: CRNA  Anesthesia Plan Comments: (LMA/GETA backup discussed.  Patient consented for risks of anesthesia including but not limited to:  - adverse reactions to medications - damage to eyes, teeth, lips or other oral mucosa - nerve damage due to positioning  - sore  throat or hoarseness - damage to heart, brain, nerves, lungs, other parts of body or loss of life  Informed patient about role of CRNA in peri- and intra-operative care.  Patient voiced understanding.)       Anesthesia Quick Evaluation

## 2022-11-11 ENCOUNTER — Ambulatory Visit: Payer: BC Managed Care – PPO | Admitting: Anesthesiology

## 2022-11-11 ENCOUNTER — Encounter: Payer: Self-pay | Admitting: Gastroenterology

## 2022-11-11 ENCOUNTER — Ambulatory Visit
Admission: RE | Admit: 2022-11-11 | Discharge: 2022-11-11 | Disposition: A | Discharge status: Home / Self Care | Payer: BC Managed Care – PPO | Attending: Gastroenterology | Admitting: Gastroenterology

## 2022-11-11 ENCOUNTER — Encounter
Admission: RE | Disposition: A | Discharge status: Home / Self Care | Payer: Self-pay | Source: Home / Self Care | Attending: Gastroenterology

## 2022-11-11 DIAGNOSIS — K635 Polyp of colon: Secondary | ICD-10-CM | POA: Diagnosis not present

## 2022-11-11 DIAGNOSIS — F419 Anxiety disorder, unspecified: Secondary | ICD-10-CM | POA: Insufficient documentation

## 2022-11-11 DIAGNOSIS — R519 Headache, unspecified: Secondary | ICD-10-CM | POA: Insufficient documentation

## 2022-11-11 DIAGNOSIS — Z1211 Encounter for screening for malignant neoplasm of colon: Secondary | ICD-10-CM | POA: Diagnosis not present

## 2022-11-11 DIAGNOSIS — D123 Benign neoplasm of transverse colon: Secondary | ICD-10-CM | POA: Diagnosis not present

## 2022-11-11 HISTORY — PX: COLONOSCOPY WITH PROPOFOL: SHX5780

## 2022-11-11 LAB — POCT PREGNANCY, URINE: Preg Test, Ur: NEGATIVE

## 2022-11-11 SURGERY — COLONOSCOPY WITH PROPOFOL
Anesthesia: General

## 2022-11-11 MED ORDER — LIDOCAINE 2% (20 MG/ML) 5 ML SYRINGE
INTRAMUSCULAR | Status: DC | PRN
  Administered 2022-11-11: 50 mg via INTRAVENOUS

## 2022-11-11 MED ORDER — PROPOFOL 10 MG/ML IV BOLUS
INTRAVENOUS | Status: DC | PRN
  Administered 2022-11-11: 70 mg via INTRAVENOUS
  Administered 2022-11-11: 30 mg via INTRAVENOUS
  Administered 2022-11-11: 70 mg via INTRAVENOUS

## 2022-11-11 MED ORDER — DEXMEDETOMIDINE HCL IN NACL 80 MCG/20ML IV SOLN
INTRAVENOUS | Status: DC | PRN
  Administered 2022-11-11: 8 ug via BUCCAL

## 2022-11-11 MED ORDER — SODIUM CHLORIDE 0.9 % IV SOLN: INTRAVENOUS | Status: DC

## 2022-11-11 MED ORDER — PROPOFOL 1000 MG/100ML IV EMUL
INTRAVENOUS | Status: AC
  Filled 2022-11-11: qty 100

## 2022-11-11 MED ORDER — LIDOCAINE HCL (PF) 2 % IJ SOLN
INTRAMUSCULAR | Status: AC
  Filled 2022-11-11: qty 5

## 2022-11-11 MED ORDER — PROPOFOL 500 MG/50ML IV EMUL
INTRAVENOUS | Status: DC | PRN
  Administered 2022-11-11: 150 ug/kg/min via INTRAVENOUS

## 2022-11-11 NOTE — Transfer of Care (Signed)
Immediate Anesthesia Transfer of Care Note  Patient: Veronica Gibson  Procedure(s) Performed: COLONOSCOPY WITH PROPOFOL  Patient Location: Endoscopy Unit  Anesthesia Type:General  Level of Consciousness: awake  Airway & Oxygen Therapy: Patient Spontanous Breathing  Post-op Assessment: Report given to RN and Post -op Vital signs reviewed and stable  Post vital signs: Reviewed and stable  Last Vitals:  Vitals Value Taken Time  BP 104/54 11/11/22 0931  Temp    Pulse 69 11/11/22 0931  Resp 18 11/11/22 0931  SpO2 100 % 11/11/22 0931  Vitals shown include unvalidated device data.  Last Pain:  Vitals:   11/11/22 0804  TempSrc: Temporal  PainSc:          Complications: No notable events documented.

## 2022-11-11 NOTE — H&P (Signed)
Veronica Darby, MD [address]  [place]  [place], [place] [address]  Main: [phone]  Fax: [phone] Pager: [phone]  Primary Care Physician:  [facility], [place] Primary Gastroenterologist:  Dr. Cephas Gibson  Pre-Procedure History & Physical: HPI:  Veronica Gibson is a 47 y.o. female is here for an colonoscopy.   Past Medical History:  Diagnosis Date   Anxiety    Genital herpes 03/2020   confirmed by HSV 2 IgG   Head ache    History of mammogram 12/13/2013   CAT 2   History of Papanicolaou smear of cervix 04/07/2014   -/-   Rupture Achilles tendon 08/14/2017   Right   Seasonal allergies    Vaccine for human papilloma virus (HPV) types 6, 11, 16, and 18 administered     Past Surgical History:  Procedure Laterality Date   ACHILLES TENDON REPAIR  08/18/2017   WISDOM TOOTH EXTRACTION      Prior to Admission medications   Medication Sig Start Date End Date Taking? Authorizing Provider  spironolactone (ALDACTONE) 50 MG tablet Take 50 mg by mouth 2 (two) times daily. 11/15/20  Yes [provider]  azelastine (ASTELIN) 0.1 % nasal spray SMARTSIG:1-2 Spray(s) Both Nares Twice Daily 03/29/19   [provider]  cetirizine (ZYRTEC) 10 MG tablet Take 10 mg by mouth daily.    [provider]  fexofenadine (ALLEGRA) 180 MG tablet  03/24/19   [provider]  fluticasone (FLONASE) 50 MCG/ACT nasal spray PLACE 1-2 SPRAY IN EACH NOSTRIL ONCE A DAY 12/27/15   [provider]  levonorgestrel (MIRENA, 52 MG,) 20 MCG/24HR IUD 1 Intra Uterine Device (1 each total) by Intrauterine route once for 1 dose. 11/25/17 0000000  Copland, Deirdre Evener, PA-C  loratadine (CLARITIN) 10 MG tablet Take 10 mg by mouth 2 (two) times daily.    [provider]  sulfamethoxazole-trimethoprim (BACTRIM) 400-80 MG tablet Take 1 tablet by mouth daily as needed. 07/27/22   [provider]  valACYclovir (VALTREX) 500 MG tablet Take 1 tablet  (500 mg total) by mouth daily. [id number]   Copland, Alicia B, PA-C    Allergies as of 10/22/2022 - Review Complete 10/17/2022  Allergen Reaction Noted   Dust mite mixed allergen ext [mite (d. farinae)] Other (See Comments) 11/25/2016   Other  09/09/2017   Pollen extract  09/09/2017   Shellfish allergy  11/25/2016    Family History  Problem Relation Age of Onset   Uterine cancer Paternal Grandmother 66   Coronary artery disease Paternal Grandfather        CABG   Cervical cancer Maternal Aunt        died at 61   Cervical cancer Paternal Aunt 91   Pancreatic cancer Maternal Great-grandmother 84   Uterine cancer Other 71   Colon cancer Other     Social History   Socioeconomic History   Marital status: Divorced    Spouse name: Not on file   Number of children: 2   Years of education: 18   Highest education level: Not on file  Occupational History   Occupation: Designer, jewellery: [employer]  Tobacco Use   Smoking status: Never   Smokeless tobacco: Never  Scientific laboratory technician Use: Never used  Substance and Sexual Activity   Alcohol use: Yes    Comment: OCC   Drug use: No   Sexual activity: Yes    Birth control/protection: I.U.D.  Comment: Mirena  Other Topics Concern   Not on file  Social History Narrative   Not on file   Social Determinants of Health   Financial Resource Strain: Not on file  Food Insecurity: Not on file  Transportation Needs: Not on file  Physical Activity: Inactive (10/30/2017)   Exercise Vital Sign    Days of Exercise per Week: 0 days    Minutes of Exercise per Session: 0 min  Stress: No Stress Concern Present (10/30/2017)   [place]    Feeling of Stress : Only a little  Social Connections: Not on file  Intimate Partner Violence: Not on file    Review of Systems: See HPI, otherwise negative ROS  Physical Exam: BP 128/82   Pulse 68   Temp 98.6 F (37 C)  (Temporal)   Resp 16   Ht 5' 6"$  (1.676 m)   Wt 55.8 kg   SpO2 100%   BMI 19.85 kg/m  General:   Alert,  pleasant and cooperative in NAD Head:  Normocephalic and atraumatic. Neck:  Supple; no masses or thyromegaly. Lungs:  Clear throughout to auscultation.    Heart:  Regular rate and rhythm. Abdomen:  Soft, nontender and nondistended. Normal bowel sounds, without guarding, and without rebound.   Neurologic:  Alert and  oriented x4;  grossly normal neurologically.  Impression/Plan: Veronica Gibson is here for an colonoscopy to be performed for colon cancer screening  Risks, benefits, limitations, and alternatives regarding  colonoscopy have been reviewed with the patient.  Questions have been answered.  All parties agreeable.   Sherri Sear, MD  11/11/2022, 8:44 AM

## 2022-11-11 NOTE — Op Note (Signed)
[facility] Gastroenterology Patient Name: Veronica Gibson Procedure Date: 11/11/2022 8:48 AM MRN: [id number] Account #: [id number] Date of Birth: March 11, 1976 Admit Type: Outpatient Age: 47 Room: [facility] ENDO ROOM 4 Gender: Female Note Status: Finalized Instrument Name: Peds Colonoscope [id number] Procedure:             Colonoscopy Indications:           Screening for colorectal malignant neoplasm, This is                         the patient's first colonoscopy Providers:             Lin Landsman MD, MD Referring MD:          Lin Landsman MD, MD (Referring MD), Carola Frost.                         Roxan Hockey (Referring MD) Medicines:             General Anesthesia Complications:         No immediate complications. Estimated blood loss: None. Procedure:             Pre-Anesthesia Assessment:                        - Prior to the procedure, a History and Physical was                         performed, and patient medications and allergies were                         reviewed. The patient is competent. The risks and                         benefits of the procedure and the sedation options and                         risks were discussed with the patient. All questions                         were answered and informed consent was obtained.                         Patient identification and proposed procedure were                         verified by the physician, the nurse, the                         anesthesiologist, the anesthetist and the technician                         in the pre-procedure area in the procedure room in the                         endoscopy suite. Mental Status Examination: alert and                         oriented. Airway Examination: normal oropharyngeal  airway and neck mobility. Respiratory Examination:                         clear to auscultation. CV Examination: normal.                         Prophylactic  Antibiotics: The patient does not require                         prophylactic antibiotics. Prior Anticoagulants: The                         patient has taken no anticoagulant or antiplatelet                         agents. ASA Grade Assessment: II - A patient with mild                         systemic disease. After reviewing the risks and                         benefits, the patient was deemed in satisfactory                         condition to undergo the procedure. The anesthesia                         plan was to use general anesthesia. Immediately prior                         to administration of medications, the patient was                         re-assessed for adequacy to receive sedatives. The                         heart rate, respiratory rate, oxygen saturations,                         blood pressure, adequacy of pulmonary ventilation, and                         response to care were monitored throughout the                         procedure. The physical status of the patient was                         re-assessed after the procedure.                        After obtaining informed consent, the colonoscope was                         passed under direct vision. Throughout the procedure,                         the patient's blood pressure, pulse, and oxygen  saturations were monitored continuously. The                         Colonoscope was introduced through the anus and                         advanced to the the terminal ileum, with                         identification of the appendiceal orifice and IC                         valve. The colonoscopy was performed without                         difficulty. The patient tolerated the procedure well.                         The quality of the bowel preparation was evaluated                         using the BBPS [facility] Bowel Preparation Scale) with                         scores of: Right  Colon = 3, Transverse Colon = 3 and                         Left Colon = 3 (entire mucosa seen well with no                         residual staining, small fragments of stool or opaque                         liquid). The total BBPS score equals 9. The terminal                         ileum, ileocecal valve, appendiceal orifice, and                         rectum were photographed. Findings:      The perianal and digital rectal examinations were normal. Pertinent       negatives include normal sphincter tone and no palpable rectal lesions.      The terminal ileum appeared normal.      Two sessile polyps were found in the transverse colon. The polyps were 5       to 6 mm in size. These polyps were removed with a cold snare. Resection       and retrieval were complete. Estimated blood loss: none.      The retroflexed view of the distal rectum and anal verge was normal and       showed no anal or rectal abnormalities. Impression:            - The examined portion of the ileum was normal.                        - Two 5 to 6 mm polyps in the transverse colon,  removed with a cold snare. Resected and retrieved.                        - The distal rectum and anal verge are normal on                         retroflexion view. Recommendation:        - Discharge patient to home (with escort).                        - Resume previous diet today.                        - Continue present medications.                        - Await pathology results.                        - Repeat colonoscopy in 3 - 5 years for surveillance                         based on pathology results. Procedure Code(s):     --- Professional ---                        [phone], Colonoscopy, flexible; with removal of                         tumor(s), polyp(s), or other lesion(s) by snare                         technique Diagnosis Code(s):     --- Professional ---                        Z12.11, Encounter  for screening for malignant neoplasm                         of colon                        D12.3, Benign neoplasm of transverse colon (hepatic                         flexure or splenic flexure) CPT copyright 2022 American Medical Association. All rights reserved. The codes documented in this report are preliminary and upon coder review may  be revised to meet current compliance requirements. Dr. Ulyess Mort Lin Landsman MD, MD 11/11/2022 9:33:20 AM This report has been signed electronically. Number of Addenda: 0 Note Initiated On: 11/11/2022 8:48 AM Scope Withdrawal Time: 0 hours 15 minutes 56 seconds  Total Procedure Duration: 0 hours 23 minutes 2 seconds  Estimated Blood Loss:  Estimated blood loss: none.      [facility]

## 2022-11-11 NOTE — Anesthesia Postprocedure Evaluation (Signed)
Anesthesia Post Note  Patient: Veronica Gibson  Procedure(s) Performed: COLONOSCOPY WITH PROPOFOL  Patient location during evaluation: PACU Anesthesia Type: General Level of consciousness: awake and alert, oriented and patient cooperative Pain management: pain level controlled Vital Signs Assessment: post-procedure vital signs reviewed and stable Respiratory status: spontaneous breathing, nonlabored ventilation and respiratory function stable Cardiovascular status: blood pressure returned to baseline and stable Postop Assessment: adequate PO intake Anesthetic complications: no   No notable events documented.   Last Vitals:  Vitals:   11/11/22 0940 11/11/22 0950  BP: 109/65 110/85  Pulse: 62 68  Resp: 13 15  Temp:    SpO2: 99% 99%    Last Pain:  Vitals:   11/11/22 0950  TempSrc:   PainSc: 0-No pain                 Darrin Nipper

## 2022-11-12 ENCOUNTER — Encounter: Payer: Self-pay | Admitting: Gastroenterology

## 2022-11-12 DIAGNOSIS — J3081 Allergic rhinitis due to animal (cat) (dog) hair and dander: Secondary | ICD-10-CM | POA: Diagnosis not present

## 2022-11-12 DIAGNOSIS — J3089 Other allergic rhinitis: Secondary | ICD-10-CM | POA: Diagnosis not present

## 2022-11-12 DIAGNOSIS — J301 Allergic rhinitis due to pollen: Secondary | ICD-10-CM | POA: Diagnosis not present

## 2022-11-12 LAB — SURGICAL PATHOLOGY

## 2022-11-13 DIAGNOSIS — F411 Generalized anxiety disorder: Secondary | ICD-10-CM | POA: Diagnosis not present

## 2022-11-19 DIAGNOSIS — Z91013 Allergy to seafood: Secondary | ICD-10-CM | POA: Diagnosis not present

## 2022-11-19 DIAGNOSIS — J3081 Allergic rhinitis due to animal (cat) (dog) hair and dander: Secondary | ICD-10-CM | POA: Diagnosis not present

## 2022-11-19 DIAGNOSIS — J301 Allergic rhinitis due to pollen: Secondary | ICD-10-CM | POA: Diagnosis not present

## 2022-11-19 DIAGNOSIS — J3089 Other allergic rhinitis: Secondary | ICD-10-CM | POA: Diagnosis not present

## 2022-11-21 DIAGNOSIS — N390 Urinary tract infection, site not specified: Secondary | ICD-10-CM | POA: Diagnosis not present

## 2022-11-21 DIAGNOSIS — Z87440 Personal history of urinary (tract) infections: Secondary | ICD-10-CM | POA: Diagnosis not present

## 2022-11-21 DIAGNOSIS — B9620 Unspecified Escherichia coli [E. coli] as the cause of diseases classified elsewhere: Secondary | ICD-10-CM | POA: Diagnosis not present

## 2022-11-21 DIAGNOSIS — N3000 Acute cystitis without hematuria: Secondary | ICD-10-CM | POA: Diagnosis not present

## 2022-11-22 DIAGNOSIS — R310 Gross hematuria: Secondary | ICD-10-CM | POA: Diagnosis not present

## 2022-11-22 DIAGNOSIS — B9620 Unspecified Escherichia coli [E. coli] as the cause of diseases classified elsewhere: Secondary | ICD-10-CM | POA: Diagnosis not present

## 2022-11-22 DIAGNOSIS — N3000 Acute cystitis without hematuria: Secondary | ICD-10-CM | POA: Diagnosis not present

## 2022-11-22 DIAGNOSIS — N130 Hydronephrosis with ureteropelvic junction obstruction: Secondary | ICD-10-CM | POA: Diagnosis not present

## 2022-11-22 DIAGNOSIS — N390 Urinary tract infection, site not specified: Secondary | ICD-10-CM | POA: Diagnosis not present

## 2022-11-26 DIAGNOSIS — J3089 Other allergic rhinitis: Secondary | ICD-10-CM | POA: Diagnosis not present

## 2022-11-26 DIAGNOSIS — J3081 Allergic rhinitis due to animal (cat) (dog) hair and dander: Secondary | ICD-10-CM | POA: Diagnosis not present

## 2022-11-26 DIAGNOSIS — J301 Allergic rhinitis due to pollen: Secondary | ICD-10-CM | POA: Diagnosis not present

## 2022-11-28 ENCOUNTER — Ambulatory Visit
Admission: RE | Admit: 2022-11-28 | Discharge: 2022-11-28 | Disposition: A | Discharge status: Home / Self Care | Payer: BC Managed Care – PPO | Source: Ambulatory Visit | Attending: Obstetrics and Gynecology | Admitting: Obstetrics and Gynecology

## 2022-11-28 DIAGNOSIS — Z1231 Encounter for screening mammogram for malignant neoplasm of breast: Secondary | ICD-10-CM | POA: Diagnosis not present

## 2022-12-02 ENCOUNTER — Other Ambulatory Visit: Payer: Self-pay | Admitting: Obstetrics and Gynecology

## 2022-12-02 DIAGNOSIS — R921 Mammographic calcification found on diagnostic imaging of breast: Secondary | ICD-10-CM

## 2022-12-02 DIAGNOSIS — R928 Other abnormal and inconclusive findings on diagnostic imaging of breast: Secondary | ICD-10-CM

## 2022-12-02 DIAGNOSIS — R310 Gross hematuria: Secondary | ICD-10-CM | POA: Diagnosis not present

## 2022-12-02 DIAGNOSIS — R319 Hematuria, unspecified: Secondary | ICD-10-CM | POA: Diagnosis not present

## 2022-12-03 DIAGNOSIS — J3089 Other allergic rhinitis: Secondary | ICD-10-CM | POA: Diagnosis not present

## 2022-12-03 DIAGNOSIS — J3081 Allergic rhinitis due to animal (cat) (dog) hair and dander: Secondary | ICD-10-CM | POA: Diagnosis not present

## 2022-12-03 DIAGNOSIS — J301 Allergic rhinitis due to pollen: Secondary | ICD-10-CM | POA: Diagnosis not present

## 2022-12-10 ENCOUNTER — Ambulatory Visit
Admission: RE | Admit: 2022-12-10 | Discharge: 2022-12-10 | Disposition: A | Discharge status: Home / Self Care | Payer: BC Managed Care – PPO | Source: Ambulatory Visit | Attending: Obstetrics and Gynecology | Admitting: Obstetrics and Gynecology

## 2022-12-10 ENCOUNTER — Other Ambulatory Visit: Payer: Self-pay | Admitting: Obstetrics and Gynecology

## 2022-12-10 DIAGNOSIS — R921 Mammographic calcification found on diagnostic imaging of breast: Secondary | ICD-10-CM | POA: Diagnosis not present

## 2022-12-10 DIAGNOSIS — R928 Other abnormal and inconclusive findings on diagnostic imaging of breast: Secondary | ICD-10-CM | POA: Diagnosis not present

## 2022-12-11 ENCOUNTER — Other Ambulatory Visit: Payer: Self-pay | Admitting: Obstetrics and Gynecology

## 2022-12-11 DIAGNOSIS — R928 Other abnormal and inconclusive findings on diagnostic imaging of breast: Secondary | ICD-10-CM

## 2022-12-11 DIAGNOSIS — F411 Generalized anxiety disorder: Secondary | ICD-10-CM | POA: Diagnosis not present

## 2022-12-11 DIAGNOSIS — R921 Mammographic calcification found on diagnostic imaging of breast: Secondary | ICD-10-CM

## 2022-12-12 DIAGNOSIS — J3089 Other allergic rhinitis: Secondary | ICD-10-CM | POA: Diagnosis not present

## 2022-12-12 DIAGNOSIS — J3081 Allergic rhinitis due to animal (cat) (dog) hair and dander: Secondary | ICD-10-CM | POA: Diagnosis not present

## 2022-12-12 DIAGNOSIS — J301 Allergic rhinitis due to pollen: Secondary | ICD-10-CM | POA: Diagnosis not present

## 2022-12-17 DIAGNOSIS — J3081 Allergic rhinitis due to animal (cat) (dog) hair and dander: Secondary | ICD-10-CM | POA: Diagnosis not present

## 2022-12-17 DIAGNOSIS — J3089 Other allergic rhinitis: Secondary | ICD-10-CM | POA: Diagnosis not present

## 2022-12-17 DIAGNOSIS — J301 Allergic rhinitis due to pollen: Secondary | ICD-10-CM | POA: Diagnosis not present

## 2022-12-31 ENCOUNTER — Ambulatory Visit
Admission: RE | Admit: 2022-12-31 | Discharge: 2022-12-31 | Disposition: A | Discharge status: Home / Self Care | Payer: BC Managed Care – PPO | Source: Ambulatory Visit | Attending: Obstetrics and Gynecology | Admitting: Obstetrics and Gynecology

## 2022-12-31 DIAGNOSIS — J3081 Allergic rhinitis due to animal (cat) (dog) hair and dander: Secondary | ICD-10-CM | POA: Diagnosis not present

## 2022-12-31 DIAGNOSIS — R921 Mammographic calcification found on diagnostic imaging of breast: Secondary | ICD-10-CM

## 2022-12-31 DIAGNOSIS — R928 Other abnormal and inconclusive findings on diagnostic imaging of breast: Secondary | ICD-10-CM

## 2022-12-31 DIAGNOSIS — J3089 Other allergic rhinitis: Secondary | ICD-10-CM | POA: Diagnosis not present

## 2022-12-31 DIAGNOSIS — N6489 Other specified disorders of breast: Secondary | ICD-10-CM | POA: Diagnosis not present

## 2022-12-31 DIAGNOSIS — J301 Allergic rhinitis due to pollen: Secondary | ICD-10-CM | POA: Diagnosis not present

## 2022-12-31 HISTORY — PX: BREAST BIOPSY: SHX20

## 2022-12-31 MED ORDER — LIDOCAINE HCL (PF) 1 % IJ SOLN
2.0000 mL | Freq: Once | INTRAMUSCULAR | Status: AC
  Administered 2022-12-31: 2 mL via INTRADERMAL

## 2022-12-31 MED ORDER — LIDOCAINE-EPINEPHRINE 1 %-1:100000 IJ SOLN
8.0000 mL | Freq: Once | INTRAMUSCULAR | Status: AC
  Administered 2022-12-31: 8 mL

## 2023-01-01 LAB — SURGICAL PATHOLOGY

## 2023-01-02 ENCOUNTER — Encounter: Payer: Self-pay | Admitting: *Deleted

## 2023-01-02 DIAGNOSIS — Z87440 Personal history of urinary (tract) infections: Secondary | ICD-10-CM | POA: Diagnosis not present

## 2023-01-02 DIAGNOSIS — R310 Gross hematuria: Secondary | ICD-10-CM | POA: Diagnosis not present

## 2023-01-02 NOTE — Progress Notes (Signed)
Referral recieved from [facility] Radiology for benign breast mass. She will let me know which surgeon she would like to see.

## 2023-01-08 ENCOUNTER — Encounter: Payer: Self-pay | Admitting: *Deleted

## 2023-01-08 NOTE — Progress Notes (Signed)
Ms. Walthers would like to see Dr. Flossie Dibble at [facility] breast clinic.   Referral/clinicals/demographics faxed to her office.   No further needs at this time.

## 2023-01-09 DIAGNOSIS — J3081 Allergic rhinitis due to animal (cat) (dog) hair and dander: Secondary | ICD-10-CM | POA: Diagnosis not present

## 2023-01-09 DIAGNOSIS — J301 Allergic rhinitis due to pollen: Secondary | ICD-10-CM | POA: Diagnosis not present

## 2023-01-09 DIAGNOSIS — J3089 Other allergic rhinitis: Secondary | ICD-10-CM | POA: Diagnosis not present

## 2023-01-14 DIAGNOSIS — J3089 Other allergic rhinitis: Secondary | ICD-10-CM | POA: Diagnosis not present

## 2023-01-14 DIAGNOSIS — J3081 Allergic rhinitis due to animal (cat) (dog) hair and dander: Secondary | ICD-10-CM | POA: Diagnosis not present

## 2023-01-14 DIAGNOSIS — J301 Allergic rhinitis due to pollen: Secondary | ICD-10-CM | POA: Diagnosis not present

## 2023-01-21 DIAGNOSIS — L700 Acne vulgaris: Secondary | ICD-10-CM | POA: Diagnosis not present

## 2023-01-23 DIAGNOSIS — J3089 Other allergic rhinitis: Secondary | ICD-10-CM | POA: Diagnosis not present

## 2023-01-23 DIAGNOSIS — J301 Allergic rhinitis due to pollen: Secondary | ICD-10-CM | POA: Diagnosis not present

## 2023-01-23 DIAGNOSIS — J3081 Allergic rhinitis due to animal (cat) (dog) hair and dander: Secondary | ICD-10-CM | POA: Diagnosis not present

## 2023-01-28 DIAGNOSIS — J3089 Other allergic rhinitis: Secondary | ICD-10-CM | POA: Diagnosis not present

## 2023-01-28 DIAGNOSIS — J3081 Allergic rhinitis due to animal (cat) (dog) hair and dander: Secondary | ICD-10-CM | POA: Diagnosis not present

## 2023-01-28 DIAGNOSIS — J301 Allergic rhinitis due to pollen: Secondary | ICD-10-CM | POA: Diagnosis not present

## 2023-02-03 DIAGNOSIS — N6082 Other benign mammary dysplasias of left breast: Secondary | ICD-10-CM | POA: Diagnosis not present

## 2023-02-03 DIAGNOSIS — R920 Mammographic microcalcification found on diagnostic imaging of breast: Secondary | ICD-10-CM | POA: Diagnosis not present

## 2023-02-04 DIAGNOSIS — J301 Allergic rhinitis due to pollen: Secondary | ICD-10-CM | POA: Diagnosis not present

## 2023-02-04 DIAGNOSIS — J3089 Other allergic rhinitis: Secondary | ICD-10-CM | POA: Diagnosis not present

## 2023-02-04 DIAGNOSIS — J3081 Allergic rhinitis due to animal (cat) (dog) hair and dander: Secondary | ICD-10-CM | POA: Diagnosis not present

## 2023-02-11 DIAGNOSIS — J3081 Allergic rhinitis due to animal (cat) (dog) hair and dander: Secondary | ICD-10-CM | POA: Diagnosis not present

## 2023-02-11 DIAGNOSIS — J3089 Other allergic rhinitis: Secondary | ICD-10-CM | POA: Diagnosis not present

## 2023-02-11 DIAGNOSIS — J301 Allergic rhinitis due to pollen: Secondary | ICD-10-CM | POA: Diagnosis not present

## 2023-02-18 DIAGNOSIS — N6082 Other benign mammary dysplasias of left breast: Secondary | ICD-10-CM | POA: Diagnosis not present

## 2023-02-18 DIAGNOSIS — R920 Mammographic microcalcification found on diagnostic imaging of breast: Secondary | ICD-10-CM | POA: Diagnosis not present

## 2023-02-20 DIAGNOSIS — J3089 Other allergic rhinitis: Secondary | ICD-10-CM | POA: Diagnosis not present

## 2023-02-20 DIAGNOSIS — J301 Allergic rhinitis due to pollen: Secondary | ICD-10-CM | POA: Diagnosis not present

## 2023-02-20 DIAGNOSIS — J3081 Allergic rhinitis due to animal (cat) (dog) hair and dander: Secondary | ICD-10-CM | POA: Diagnosis not present

## 2023-02-25 DIAGNOSIS — J301 Allergic rhinitis due to pollen: Secondary | ICD-10-CM | POA: Diagnosis not present

## 2023-02-25 DIAGNOSIS — J3081 Allergic rhinitis due to animal (cat) (dog) hair and dander: Secondary | ICD-10-CM | POA: Diagnosis not present

## 2023-02-25 DIAGNOSIS — J3089 Other allergic rhinitis: Secondary | ICD-10-CM | POA: Diagnosis not present

## 2023-03-04 DIAGNOSIS — J3089 Other allergic rhinitis: Secondary | ICD-10-CM | POA: Diagnosis not present

## 2023-03-04 DIAGNOSIS — J301 Allergic rhinitis due to pollen: Secondary | ICD-10-CM | POA: Diagnosis not present

## 2023-03-04 DIAGNOSIS — J3081 Allergic rhinitis due to animal (cat) (dog) hair and dander: Secondary | ICD-10-CM | POA: Diagnosis not present

## 2023-03-05 DIAGNOSIS — J3089 Other allergic rhinitis: Secondary | ICD-10-CM | POA: Diagnosis not present

## 2023-03-11 DIAGNOSIS — J3081 Allergic rhinitis due to animal (cat) (dog) hair and dander: Secondary | ICD-10-CM | POA: Diagnosis not present

## 2023-03-11 DIAGNOSIS — J3089 Other allergic rhinitis: Secondary | ICD-10-CM | POA: Diagnosis not present

## 2023-03-11 DIAGNOSIS — J301 Allergic rhinitis due to pollen: Secondary | ICD-10-CM | POA: Diagnosis not present

## 2023-03-13 DIAGNOSIS — N6082 Other benign mammary dysplasias of left breast: Secondary | ICD-10-CM | POA: Diagnosis not present

## 2023-03-13 DIAGNOSIS — R921 Mammographic calcification found on diagnostic imaging of breast: Secondary | ICD-10-CM | POA: Diagnosis not present

## 2023-03-14 DIAGNOSIS — R920 Mammographic microcalcification found on diagnostic imaging of breast: Secondary | ICD-10-CM | POA: Diagnosis not present

## 2023-03-14 DIAGNOSIS — Z79899 Other long term (current) drug therapy: Secondary | ICD-10-CM | POA: Diagnosis not present

## 2023-03-14 DIAGNOSIS — Z79624 Long term (current) use of inhibitors of nucleotide synthesis: Secondary | ICD-10-CM | POA: Diagnosis not present

## 2023-03-14 DIAGNOSIS — N6082 Other benign mammary dysplasias of left breast: Secondary | ICD-10-CM | POA: Diagnosis not present

## 2023-03-14 HISTORY — PX: BREAST EXCISIONAL BIOPSY: SUR124

## 2023-03-18 DIAGNOSIS — J301 Allergic rhinitis due to pollen: Secondary | ICD-10-CM | POA: Diagnosis not present

## 2023-03-18 DIAGNOSIS — J3081 Allergic rhinitis due to animal (cat) (dog) hair and dander: Secondary | ICD-10-CM | POA: Diagnosis not present

## 2023-03-18 DIAGNOSIS — J3089 Other allergic rhinitis: Secondary | ICD-10-CM | POA: Diagnosis not present

## 2023-04-08 DIAGNOSIS — J3089 Other allergic rhinitis: Secondary | ICD-10-CM | POA: Diagnosis not present

## 2023-04-08 DIAGNOSIS — J301 Allergic rhinitis due to pollen: Secondary | ICD-10-CM | POA: Diagnosis not present

## 2023-04-08 DIAGNOSIS — J3081 Allergic rhinitis due to animal (cat) (dog) hair and dander: Secondary | ICD-10-CM | POA: Diagnosis not present

## 2023-04-14 DIAGNOSIS — N6082 Other benign mammary dysplasias of left breast: Secondary | ICD-10-CM | POA: Diagnosis not present

## 2023-04-14 DIAGNOSIS — R920 Mammographic microcalcification found on diagnostic imaging of breast: Secondary | ICD-10-CM | POA: Diagnosis not present

## 2023-04-15 DIAGNOSIS — J3081 Allergic rhinitis due to animal (cat) (dog) hair and dander: Secondary | ICD-10-CM | POA: Diagnosis not present

## 2023-04-15 DIAGNOSIS — J3089 Other allergic rhinitis: Secondary | ICD-10-CM | POA: Diagnosis not present

## 2023-04-15 DIAGNOSIS — J301 Allergic rhinitis due to pollen: Secondary | ICD-10-CM | POA: Diagnosis not present

## 2023-04-25 DIAGNOSIS — J3089 Other allergic rhinitis: Secondary | ICD-10-CM | POA: Diagnosis not present

## 2023-04-25 DIAGNOSIS — J301 Allergic rhinitis due to pollen: Secondary | ICD-10-CM | POA: Diagnosis not present

## 2023-04-25 DIAGNOSIS — J3081 Allergic rhinitis due to animal (cat) (dog) hair and dander: Secondary | ICD-10-CM | POA: Diagnosis not present

## 2023-04-25 NOTE — Progress Notes (Unsigned)
Name: Veronica Gibson   MRN: [id number]    DOB: [birth year]   Date:04/29/2023       Progress Note  Subjective  Chief Complaint  Chief Complaint  Patient presents with   Annual Exam    HPI  Patient presents for annual CPE.  Diet: Regular, well balanced diet and takes vitamins Exercise: 5 days a week 45 minutes, she is a runner Last Eye Exam: February 2024 Last Dental Exam: March 2024  Flowsheet Row Office Visit from 04/29/2023 in [facility]  AUDIT-C Score 0      Depression: Phq 9 is  negative    04/29/2023    9:15 AM 04/12/2022   11:10 AM 02/26/2021    2:32 PM 02/13/2021    8:52 AM 02/09/2021    9:04 AM  Depression screen PHQ 2/9  Decreased Interest 0 1 0 0 0  Down, Depressed, Hopeless 0 1 0 0 0  PHQ - 2 Score 0 2 0 0 0  Altered sleeping  0 0    Tired, decreased energy  0 0    Change in appetite  0 0    Feeling bad or failure about yourself   0 0    Trouble concentrating  0 0    Moving slowly or fidgety/restless  0 0    Suicidal thoughts  0 0    PHQ-9 Score  2 0    Difficult doing work/chores  Not difficult at all Not difficult at all     Hypertension: BP Readings from Last 3 Encounters:  04/29/23 110/70  11/11/22 110/85  10/17/22 100/64   Obesity: Wt Readings from Last 3 Encounters:  04/29/23 123 lb 8 oz (56 kg)  11/11/22 123 lb (55.8 kg)  10/17/22 125 lb (56.7 kg)   BMI Readings from Last 3 Encounters:  04/29/23 22.59 kg/m  11/11/22 19.85 kg/m  10/17/22 20.18 kg/m     Vaccines:  HPV: up to at age 46 , ask insurance if age between 29-45  Shingrix: 46-64 yo and ask insurance if covered when patient above 55 yo Pneumonia:  educated and discussed with patient. Flu:  educated and discussed with patient.     Hep C Screening: completed STD testing and prevention (HIV/chl/gon/syphilis): completed Intimate partner violence: negative screen  Sexual History : yes Menstrual History/LMP/Abnormal Bleeding: NO, IUD Discussed  importance of follow up if any post-menopausal bleeding: yes  Incontinence Symptoms: negative for symptoms   Breast cancer:  - Last Mammogram: March 7,2024 - BRCA gene screening: none  Osteoporosis Prevention : Discussed high calcium and vitamin D supplementation, weight bearing exercises Bone density :no   Cervical cancer screening: up to date  Skin cancer: Discussed monitoring for atypical lesions  Colorectal cancer: November 11, 2022 , repeat in 5 years Lung cancer:  Low Dose CT Chest recommended if Age 48-80 years, 20 pack-year currently smoking OR have quit w/in 15years. Patient does not qualify for screen   ECG: none  Advanced Care Planning: A voluntary discussion about advance care planning including the explanation and discussion of advance directives.  Discussed health care proxy and Living will, and the patient was able to identify a health care proxy as [facility].  Patient  have a living will and power of attorney of health care   Lipids: Lab Results  Component Value Date   CHOL 178 02/26/2021   CHOL 142 02/23/2020   Lab Results  Component Value Date   HDL 63 02/26/2021   HDL  53 02/23/2020   Lab Results  Component Value Date   LDLCALC 104 (H) 02/26/2021   LDLCALC 80 02/23/2020   Lab Results  Component Value Date   TRIG 37 02/26/2021   TRIG 32 02/23/2020   Lab Results  Component Value Date   CHOLHDL 2.8 02/26/2021   CHOLHDL 2.7 02/23/2020   No results found for: "LDLDIRECT"  Glucose: Glucose, Bld  Date Value Ref Range Status  02/26/2021 75 65 - 99 mg/dL Final    Comment:    .            Fasting reference interval .   09/07/2020 78 65 - 99 mg/dL Final    Comment:    .            Fasting reference interval .   02/23/2020 83 65 - 99 mg/dL Final    Comment:    .            Fasting reference interval .     Patient Active Problem List   Diagnosis Date Noted   Encounter for screening colonoscopy 11/11/2022   Polyp of transverse colon  11/11/2022   Family history of uterine cancer 08/30/2021   Increased bilirubin level 09/06/2020   Leukopenia 09/06/2020   Herpes simplex vulvovaginitis 08/29/2020   ASCUS of cervix with negative high risk HPV 08/29/2020   Recurrent UTI 02/23/2020   H/O anxiety disorder 02/23/2020   Allergies 02/23/2020   Achilles rupture, right 08/10/2017    Past Surgical History:  Procedure Laterality Date   ACHILLES TENDON REPAIR  08/18/2017   BREAST BIOPSY Left 12/31/2022   [place] LT BREAST BX W LOC DEV 1ST LESION IMG BX SPEC US GUIDE 12/31/2022 [facility]   COLONOSCOPY WITH PROPOFOL N/A 11/11/2022   Procedure: COLONOSCOPY WITH PROPOFOL;  Surgeon: Toney Reil, MD;  Location[facility] ENDOSCOPY;  Service: Gastroenterology;  Laterality: N/A;   WISDOM TOOTH EXTRACTION      Family History  Problem Relation Age of Onset   Uterine cancer Paternal Grandmother 49   Coronary artery disease Paternal Grandfather        CABG   Cervical cancer Maternal Aunt        died at 56   Cervical cancer Paternal Aunt 29   Pancreatic cancer Maternal Great-grandmother 58   Uterine cancer Other 59   Colon cancer Other     Social History   Socioeconomic History   Marital status: Divorced    Spouse name: Not on file   Number of children: 2   Years of education: 18   Highest education level: Not on file  Occupational History   Occupation: Photographer: [employer]  Tobacco Use   Smoking status: Never   Smokeless tobacco: Never  Vaping Use   Vaping status: Never Used  Substance and Sexual Activity   Alcohol use: Yes    Comment: OCC   Drug use: No   Sexual activity: Yes    Birth control/protection: I.U.D.    Comment: Mirena  Other Topics Concern   Not on file  Social History Narrative   Not on file   Social Determinants of Health   Financial Resource Strain: Low Risk  (04/29/2023)   Overall Financial Resource Strain (CARDIA)    Difficulty of Paying Living Expenses: Not hard at all   Food Insecurity: No Food Insecurity (04/29/2023)   Hunger Vital Sign    Worried About Running Out of Food in the Last Year: Never true  Ran Out of Food in the Last Year: Never true  Transportation Needs: No Transportation Needs (04/29/2023)   PRAPARE - Administrator, Civil Service (Medical): No    Lack of Transportation (Non-Medical): No  Physical Activity: Sufficiently Active (04/29/2023)   Exercise Vital Sign    Days of Exercise per Week: 5 days    Minutes of Exercise per Session: 40 min  Stress: No Stress Concern Present (04/29/2023)   [employer] of Occupational Health - Occupational Stress Questionnaire    Feeling of Stress : Not at all  Social Connections: Socially Integrated (04/29/2023)   Social Connection and Isolation Panel [NHANES]    Frequency of Communication with Friends and Family: More than three times a week    Frequency of Social Gatherings with Friends and Family: More than three times a week    Attends Religious Services: 1 to 4 times per year    Active Member of [employer] or Organizations: Yes    Attends Banker Meetings: 1 to 4 times per year    Marital Status: Married  Catering manager Violence: Not At Risk (04/29/2023)   Humiliation, Afraid, Rape, and Kick questionnaire    Fear of Current or Ex-Partner: No    Emotionally Abused: No    Physically Abused: No    Sexually Abused: No     Current Outpatient Medications:    azelastine (ASTELIN) 0.1 % nasal spray, SMARTSIG:1-2 Spray(s) Both Nares Twice Daily, Disp: , Rfl:    cetirizine (ZYRTEC) 10 MG tablet, Take 10 mg by mouth daily., Disp: , Rfl:    fluticasone (FLONASE) 50 MCG/ACT nasal spray, PLACE 1-2 SPRAY IN EACH NOSTRIL ONCE A DAY, Disp: , Rfl: 3   loratadine (CLARITIN) 10 MG tablet, Take 10 mg by mouth 2 (two) times daily., Disp: , Rfl:    spironolactone (ALDACTONE) 50 MG tablet, Take 50 mg by mouth 2 (two) times daily., Disp: , Rfl:    valACYclovir (VALTREX) 500 MG tablet, Take 1  tablet (500 mg total) by mouth daily., Disp: 90 tablet, Rfl: 3   EPINEPHrine 0.3 mg/0.3 mL IJ SOAJ injection, Inject 0.3 mg into the muscle as needed., Disp: , Rfl:    fexofenadine (ALLEGRA) 180 MG tablet, , Disp: , Rfl:    levonorgestrel (MIRENA, 52 MG,) 20 MCG/24HR IUD, 1 Intra Uterine Device (1 each total) by Intrauterine route once for 1 dose., Disp: 1 Intra Uterine Device, Rfl: 0   sulfamethoxazole-trimethoprim (BACTRIM) 400-80 MG tablet, Take 1 tablet by mouth daily as needed. (Patient not taking: Reported on 04/29/2023), Disp: , Rfl:   Allergies  Allergen Reactions   Dust Mite Mixed Allergen Ext [Mite (D. Farinae)] Other (See Comments)    And mold   Other     Other reaction(s): Other (See Comments) Trees, dust   Pollen Extract     Other reaction(s): Unknown   Shellfish Allergy      ROS  Constitutional: Negative for fever or weight change.  Respiratory: Negative for cough and shortness of breath.   Cardiovascular: Negative for chest pain or palpitations.  Gastrointestinal: Negative for abdominal pain, no bowel changes.  Musculoskeletal: Negative for gait problem or joint swelling.  Skin: Negative for rash.  Neurological: Negative for dizziness or headache.  No other specific complaints in a complete review of systems (except as listed in HPI above).   Objective  Vitals:   04/29/23 0911  BP: 110/70  Pulse: 74  Temp: 97.9 F (36.6 C)  TempSrc: Oral  SpO2:  99%  Weight: 123 lb 8 oz (56 kg)  Height: 5\' 2"  (1.575 m)    Body mass index is 22.59 kg/m.  Physical Exam Constitutional: Patient appears well-developed and well-nourished. No distress.  HENT: Head: Normocephalic and atraumatic. Ears: B TMs ok, no erythema or effusion; Nose: Nose normal. Mouth/Throat: Oropharynx is clear and moist. No oropharyngeal exudate.  Eyes: Conjunctivae and EOM are normal. Pupils are equal, round, and reactive to light. No scleral icterus.  Neck: Normal range of motion. Neck supple. No  JVD present. No thyromegaly present.  Cardiovascular: Normal rate, regular rhythm and normal heart sounds.  No murmur heard. No BLE edema. Pulmonary/Chest: Effort normal and breath sounds normal. No respiratory distress. Abdominal: Soft. Bowel sounds are normal, no distension. There is no tenderness. no masses Breast: done by GYN Musculoskeletal: Normal range of motion, no joint effusions. No gross deformities Neurological: he is alert and oriented to person, place, and time. No cranial nerve deficit. Coordination, balance, strength, speech and gait are normal.  Skin: Skin is warm and dry. No rash noted. No erythema.  Psychiatric: Patient has a normal mood and affect. behavior is normal. Judgment and thought content normal.   No results found for this or any previous visit (from the past 2160 hour(s)).   Fall Risk:    04/29/2023    9:15 AM 04/12/2022   11:10 AM 02/26/2021    2:31 PM 02/13/2021    8:52 AM 02/09/2021    9:04 AM  Fall Risk   Falls in the past year? 0 0 0 0 0  Number falls in past yr: 0 0 0 0 0  Injury with Fall? 0 0 0 0 0  Follow up    Falls evaluation completed      Functional Status Survey: Is the patient deaf or have difficulty hearing?: No Does the patient have difficulty seeing, even when wearing glasses/contacts?: No Does the patient have difficulty concentrating, remembering, or making decisions?: No Does the patient have difficulty walking or climbing stairs?: No Does the patient have difficulty dressing or bathing?: No   Assessment & Plan  1. Annual physical exam  - CBC with Differential/Platelet - COMPLETE METABOLIC PANEL WITH GFR - Lipid panel - Hemoglobin A1c - VITAMIN D 25 Hydroxy (Vit-D Deficiency, Fractures) - Iron, TIBC and Ferritin Panel - TSH - B12 and Folate Panel  2. Screening for diabetes mellitus  - COMPLETE METABOLIC PANEL WITH GFR - Hemoglobin A1c  3. Screening for deficiency anemia  - CBC with Differential/Platelet  4.  Screening for cholesterol level  - Lipid panel  5. Other fatigue -runner getting labs - VITAMIN D 25 Hydroxy (Vit-D Deficiency, Fractures) - Iron, TIBC and Ferritin Panel - TSH - B12 and Folate Panel  6. Tingling -runner getting labs - VITAMIN D 25 Hydroxy (Vit-D Deficiency, Fractures) - Iron, TIBC and Ferritin Panel - TSH - B12 and Folate Panel   -USPSTF grade A and B recommendations reviewed with patient; age-appropriate recommendations, preventive care, screening tests, etc discussed and encouraged; healthy living encouraged; see AVS for patient education given to patient -Discussed importance of 150 minutes of physical activity weekly, eat two servings of fish weekly, eat one serving of tree nuts ( cashews, pistachios, pecans, almonds.Marland Kitchen) every other day, eat 6 servings of fruit/vegetables daily and drink plenty of water and avoid sweet beverages.   -Reviewed Health Maintenance: Yes.

## 2023-04-29 ENCOUNTER — Encounter: Payer: Self-pay | Admitting: Nurse Practitioner

## 2023-04-29 ENCOUNTER — Other Ambulatory Visit: Payer: Self-pay

## 2023-04-29 ENCOUNTER — Ambulatory Visit (INDEPENDENT_AMBULATORY_CARE_PROVIDER_SITE_OTHER): Payer: BC Managed Care – PPO | Admitting: Nurse Practitioner

## 2023-04-29 VITALS — BP 110/70 | HR 74 | Temp 97.9°F | Ht 62.0 in | Wt 123.5 lb

## 2023-04-29 DIAGNOSIS — Z13220 Encounter for screening for lipoid disorders: Secondary | ICD-10-CM

## 2023-04-29 DIAGNOSIS — Z131 Encounter for screening for diabetes mellitus: Secondary | ICD-10-CM | POA: Diagnosis not present

## 2023-04-29 DIAGNOSIS — Z0000 Encounter for general adult medical examination without abnormal findings: Secondary | ICD-10-CM | POA: Diagnosis not present

## 2023-04-29 DIAGNOSIS — R5383 Other fatigue: Secondary | ICD-10-CM | POA: Diagnosis not present

## 2023-04-29 DIAGNOSIS — Z130 Encounter for screening for diseases of the blood and blood-forming organs and certain disorders involving the immune mechanism: Secondary | ICD-10-CM

## 2023-04-29 DIAGNOSIS — R202 Paresthesia of skin: Secondary | ICD-10-CM

## 2023-04-29 LAB — CBC WITH DIFFERENTIAL/PLATELET
Absolute Monocytes: 416 cells/uL (ref 200–950)
Basophils Absolute: 20 cells/uL (ref 0–200)
Basophils Relative: 0.5 %
Eosinophils Absolute: 140 cells/uL (ref 15–500)
Eosinophils Relative: 3.5 %
HCT: 39 % (ref 35.0–45.0)
Hemoglobin: 13 g/dL (ref 11.7–15.5)
Lymphs Abs: 1328 cells/uL (ref 850–3900)
MCH: 32.4 pg (ref 27.0–33.0)
MCHC: 33.3 g/dL (ref 32.0–36.0)
MCV: 97.3 fL (ref 80.0–100.0)
MPV: 10.4 fL (ref 7.5–12.5)
Monocytes Relative: 10.4 %
Neutro Abs: 2096 cells/uL (ref 1500–7800)
Neutrophils Relative %: 52.4 %
Platelets: 192 10*3/uL (ref 140–400)
RBC: 4.01 10*6/uL (ref 3.80–5.10)
RDW: 11.9 % (ref 11.0–15.0)
Total Lymphocyte: 33.2 %
WBC: 4 10*3/uL (ref 3.8–10.8)

## 2023-04-30 LAB — COMPLETE METABOLIC PANEL WITHOUT GFR
AG Ratio: 2.3 (calc) (ref 1.0–2.5)
ALT: 17 U/L (ref 6–29)
AST: 20 U/L (ref 10–35)
Albumin: 4.6 g/dL (ref 3.6–5.1)
Alkaline phosphatase (APISO): 36 U/L (ref 31–125)
BUN: 13 mg/dL (ref 7–25)
CO2: 30 mmol/L (ref 20–32)
Calcium: 9.7 mg/dL (ref 8.6–10.2)
Chloride: 104 mmol/L (ref 98–110)
Creat: 0.72 mg/dL (ref 0.50–0.99)
Globulin: 2 g/dL (ref 1.9–3.7)
Glucose, Bld: 80 mg/dL (ref 65–99)
Potassium: 4.9 mmol/L (ref 3.5–5.3)
Sodium: 138 mmol/L (ref 135–146)
Total Bilirubin: 1.9 mg/dL — ABNORMAL HIGH (ref 0.2–1.2)
Total Protein: 6.6 g/dL (ref 6.1–8.1)
eGFR: 104 mL/min/1.73m2

## 2023-04-30 LAB — LIPID PANEL
Cholesterol: 165 mg/dL
HDL: 61 mg/dL
LDL Cholesterol (Calc): 89 mg/dL
Non-HDL Cholesterol (Calc): 104 mg/dL
Total CHOL/HDL Ratio: 2.7 (calc)
Triglycerides: 61 mg/dL

## 2023-04-30 LAB — IRON,TIBC AND FERRITIN PANEL
%SAT: 39 % (ref 16–45)
Ferritin: 59 ng/mL (ref 16–232)
Iron: 126 ug/dL (ref 40–190)
TIBC: 325 ug/dL (ref 250–450)

## 2023-04-30 LAB — HEMOGLOBIN A1C
Hgb A1c MFr Bld: 5.2 %{Hb}
Mean Plasma Glucose: 103 mg/dL
eAG (mmol/L): 5.7 mmol/L

## 2023-05-01 DIAGNOSIS — J301 Allergic rhinitis due to pollen: Secondary | ICD-10-CM | POA: Diagnosis not present

## 2023-05-01 DIAGNOSIS — J3089 Other allergic rhinitis: Secondary | ICD-10-CM | POA: Diagnosis not present

## 2023-05-01 DIAGNOSIS — J3081 Allergic rhinitis due to animal (cat) (dog) hair and dander: Secondary | ICD-10-CM | POA: Diagnosis not present

## 2023-05-06 DIAGNOSIS — J3089 Other allergic rhinitis: Secondary | ICD-10-CM | POA: Diagnosis not present

## 2023-05-06 DIAGNOSIS — J301 Allergic rhinitis due to pollen: Secondary | ICD-10-CM | POA: Diagnosis not present

## 2023-05-06 DIAGNOSIS — J3081 Allergic rhinitis due to animal (cat) (dog) hair and dander: Secondary | ICD-10-CM | POA: Diagnosis not present

## 2023-05-14 ENCOUNTER — Other Ambulatory Visit: Payer: Self-pay

## 2023-05-14 ENCOUNTER — Encounter: Payer: Self-pay | Admitting: Adult Health

## 2023-05-14 ENCOUNTER — Ambulatory Visit (INDEPENDENT_AMBULATORY_CARE_PROVIDER_SITE_OTHER): Payer: Self-pay | Admitting: Adult Health

## 2023-05-14 VITALS — BP 92/63 | HR 48 | Temp 97.4°F

## 2023-05-14 DIAGNOSIS — R0981 Nasal congestion: Secondary | ICD-10-CM

## 2023-05-14 NOTE — Progress Notes (Signed)
[facility] [address] [place], [place] [address]   Office Visit Note  Patient Name: Veronica Gibson Date of Birth [id number]  Medical Record number [id number]  Date of Service: 05/14/2023  Chief Complaint  Patient presents with   Sinus Problem    Symptoms for about a week, states she has a history of sinus infection, coughing fits, states she feels better in the morning then gradually gets worse throughout the day, states she is under more stress than normal, using the netty pot for the last 3 days, states she has only been taking tylenol for headaches     Sinus Problem Associated symptoms include congestion, coughing and headaches. Pertinent negatives include no chills.   Pt is here for a sick visit. Pt reports about a week of congestion, PND, cough, and headache.  She has been using the netti pot with no relief.  She has taken some tylenol, for the headaches.  She has not tried any mucinex. Denies fever, or chills.     Current Medication:  Outpatient Encounter Medications as of 05/14/2023  Medication Sig Note   azelastine (ASTELIN) 0.1 % nasal spray SMARTSIG:1-2 Spray(s) Both Nares Twice Daily    cetirizine (ZYRTEC) 10 MG tablet Take 10 mg by mouth daily.    EPINEPHrine 0.3 mg/0.3 mL IJ SOAJ injection Inject 0.3 mg into the muscle as needed.    fluticasone (FLONASE) 50 MCG/ACT nasal spray PLACE 1-2 SPRAY IN EACH NOSTRIL ONCE A DAY 02/15/2016: Received from: External Pharmacy   levonorgestrel (MIRENA, 52 MG,) 20 MCG/24HR IUD 1 Intra Uterine Device (1 each total) by Intrauterine route once for 1 dose.    loratadine (CLARITIN) 10 MG tablet Take 10 mg by mouth 2 (two) times daily.    spironolactone (ALDACTONE) 50 MG tablet Take 50 mg by mouth 2 (two) times daily.    sulfamethoxazole-trimethoprim (BACTRIM) 400-80 MG tablet Take 1 tablet by mouth daily as needed. (Patient not taking: Reported on 04/29/2023)    valACYclovir (VALTREX) 500 MG tablet Take 1 tablet (500 mg total) by  mouth daily.    [DISCONTINUED] fexofenadine (ALLEGRA) 180 MG tablet  (Patient not taking: Reported on 10/22/2022)    No facility-administered encounter medications on file as of 05/14/2023.      Medical History: Past Medical History:  Diagnosis Date   Anxiety    Genital herpes 03/2020   confirmed by HSV 2 IgG   Head ache    History of mammogram 12/13/2013   CAT 2   History of Papanicolaou smear of cervix 04/07/2014   -/-   Rupture Achilles tendon 08/14/2017   Right   Seasonal allergies    Vaccine for human papilloma virus (HPV) types 6, 11, 16, and 18 administered      Vital Signs: BP 92/63   Pulse (!) 48   Temp (!) 97.4 F (36.3 C) (Tympanic)   SpO2 99%    Review of Systems  Constitutional:  Negative for chills and fever.  HENT:  Positive for congestion and postnasal drip.   Eyes:  Negative for pain and itching.  Respiratory:  Positive for cough.   Cardiovascular:  Negative for chest pain.  Gastrointestinal:  Negative for diarrhea, nausea and vomiting.  Neurological:  Positive for headaches.    Physical Exam Vitals reviewed.  Constitutional:      Appearance: Normal appearance.  HENT:     Head: Normocephalic.     Right Ear: Tympanic membrane and ear canal normal.     Left  Ear: Tympanic membrane and ear canal normal.     Nose:     Comments: Pale nares Eyes:     Pupils: Pupils are equal, round, and reactive to light.  Pulmonary:     Effort: Pulmonary effort is normal.     Breath sounds: Normal breath sounds.  Lymphadenopathy:     Cervical: No cervical adenopathy.  Neurological:     Mental Status: She is alert.    Assessment/Plan: 1. Sinus congestion Use oTC medications for symptom management. Follow up via MyChart messenger if symptoms fail to improve or may return to clinic as needed for worsening symptoms.      General Counseling: Makinzie verbalizes understanding of the findings of todays visit and agrees with plan of treatment. I have discussed any  further diagnostic evaluation that may be needed or ordered today. We also reviewed her medications today. she has been encouraged to call the office with any questions or concerns that should arise related to todays visit.   No orders of the defined types were placed in this encounter.   No orders of the defined types were placed in this encounter.   Time spent:20 Minutes    Johnna Acosta AGNP-C Nurse Practitioner

## 2023-05-23 DIAGNOSIS — J3081 Allergic rhinitis due to animal (cat) (dog) hair and dander: Secondary | ICD-10-CM | POA: Diagnosis not present

## 2023-05-23 DIAGNOSIS — J301 Allergic rhinitis due to pollen: Secondary | ICD-10-CM | POA: Diagnosis not present

## 2023-05-23 DIAGNOSIS — J3089 Other allergic rhinitis: Secondary | ICD-10-CM | POA: Diagnosis not present

## 2023-05-29 DIAGNOSIS — J3089 Other allergic rhinitis: Secondary | ICD-10-CM | POA: Diagnosis not present

## 2023-05-29 DIAGNOSIS — J3081 Allergic rhinitis due to animal (cat) (dog) hair and dander: Secondary | ICD-10-CM | POA: Diagnosis not present

## 2023-05-29 DIAGNOSIS — J301 Allergic rhinitis due to pollen: Secondary | ICD-10-CM | POA: Diagnosis not present

## 2023-06-06 DIAGNOSIS — J3089 Other allergic rhinitis: Secondary | ICD-10-CM | POA: Diagnosis not present

## 2023-06-06 DIAGNOSIS — J301 Allergic rhinitis due to pollen: Secondary | ICD-10-CM | POA: Diagnosis not present

## 2023-06-06 DIAGNOSIS — J3081 Allergic rhinitis due to animal (cat) (dog) hair and dander: Secondary | ICD-10-CM | POA: Diagnosis not present

## 2023-06-12 DIAGNOSIS — J301 Allergic rhinitis due to pollen: Secondary | ICD-10-CM | POA: Diagnosis not present

## 2023-06-12 DIAGNOSIS — J3081 Allergic rhinitis due to animal (cat) (dog) hair and dander: Secondary | ICD-10-CM | POA: Diagnosis not present

## 2023-06-12 DIAGNOSIS — J3089 Other allergic rhinitis: Secondary | ICD-10-CM | POA: Diagnosis not present

## 2023-06-27 DIAGNOSIS — J301 Allergic rhinitis due to pollen: Secondary | ICD-10-CM | POA: Diagnosis not present

## 2023-06-27 DIAGNOSIS — J3089 Other allergic rhinitis: Secondary | ICD-10-CM | POA: Diagnosis not present

## 2023-06-27 DIAGNOSIS — J3081 Allergic rhinitis due to animal (cat) (dog) hair and dander: Secondary | ICD-10-CM | POA: Diagnosis not present

## 2023-07-03 DIAGNOSIS — J301 Allergic rhinitis due to pollen: Secondary | ICD-10-CM | POA: Diagnosis not present

## 2023-07-03 DIAGNOSIS — J3081 Allergic rhinitis due to animal (cat) (dog) hair and dander: Secondary | ICD-10-CM | POA: Diagnosis not present

## 2023-07-03 DIAGNOSIS — J3089 Other allergic rhinitis: Secondary | ICD-10-CM | POA: Diagnosis not present

## 2023-07-09 ENCOUNTER — Ambulatory Visit: Payer: Self-pay | Admitting: *Deleted

## 2023-07-09 NOTE — Telephone Encounter (Signed)
  Chief Complaint: Stomach pain for several weeks and nausea Symptoms: Intermittent stomach pain sometimes very painful.    Frequency: Intermittently for the last several weeks Pertinent Negatives: Patient denies vomiting or diarrhea. Tums with Gas X not helping. Disposition: [] ED /[] Urgent Care (no appt availability in office) / [x] Appointment(In office/virtual)/ []  [facility]/ [] Home Care/ [] Refused Recommended Disposition /[] [facility]/ []  Follow-up with PCP Additional Notes: Appt made at her request due to her schedule for 07/16/2023 at 1:20 with Della Goo, FNP

## 2023-07-09 NOTE — Telephone Encounter (Signed)
Reason for Disposition  [1] MODERATE pain (e.g., interferes with normal activities) AND [2] pain comes and goes (cramps) AND [3] present > 24 hours  (Exception: Pain with Vomiting or Diarrhea - see that Guideline.)  Answer Assessment - Initial Assessment Questions 1. LOCATION: "Where does it hurt?"      I'm having stomach pain.   I've been eating Perfect Bars so they may not be settling with me. I'm taking Tums with Gas X I'm having nausea too.    2. RADIATION: "Does the pain shoot anywhere else?" (e.g., chest, back)     Not asked 3. ONSET: "When did the pain begin?" (e.g., minutes, hours or days ago)      A few weeks ago    4. SUDDEN: "Gradual or sudden onset?"     Not asked 5. PATTERN "Does the pain come and go, or is it constant?"    - If it comes and goes: "How long does it last?" "Do you have pain now?"     (Note: Comes and goes means the pain is intermittent. It goes away completely between bouts.)    - If constant: "Is it getting better, staying the same, or getting worse?"      (Note: Constant means the pain never goes away completely; most serious pain is constant and gets worse.)      Intermittent 6. SEVERITY: "How bad is the pain?"  (e.g., Scale 1-10; mild, moderate, or severe)    - MILD (1-3): Doesn't interfere with normal activities, abdomen soft and not tender to touch.     - MODERATE (4-7): Interferes with normal activities or awakens from sleep, abdomen tender to touch.     - SEVERE (8-10): Excruciating pain, doubled over, unable to do any normal activities.       Moderate 7. RECURRENT SYMPTOM: "Have you ever had this type of stomach pain before?" If Yes, ask: "When was the last time?" and "What happened that time?"      No 8. CAUSE: "What do you think is causing the stomach pain?"     I don't know 9. RELIEVING/AGGRAVATING FACTORS: "What makes it better or worse?" (e.g., antacids, bending or twisting motion, bowel movement)     Taking Tums and Gas X 10. OTHER SYMPTOMS:  "Do you have any other symptoms?" (e.g., back pain, diarrhea, fever, urination pain, vomiting)       No diarrhea    just nausea 11. PREGNANCY: "Is there any chance you are pregnant?" "When was your last menstrual period?"       Not asked  Protocols used: Abdominal Pain - [facility]

## 2023-07-11 DIAGNOSIS — J3089 Other allergic rhinitis: Secondary | ICD-10-CM | POA: Diagnosis not present

## 2023-07-11 DIAGNOSIS — J3081 Allergic rhinitis due to animal (cat) (dog) hair and dander: Secondary | ICD-10-CM | POA: Diagnosis not present

## 2023-07-11 DIAGNOSIS — J301 Allergic rhinitis due to pollen: Secondary | ICD-10-CM | POA: Diagnosis not present

## 2023-07-16 ENCOUNTER — Other Ambulatory Visit: Payer: Self-pay

## 2023-07-16 ENCOUNTER — Ambulatory Visit (INDEPENDENT_AMBULATORY_CARE_PROVIDER_SITE_OTHER): Payer: BC Managed Care – PPO | Admitting: Nurse Practitioner

## 2023-07-16 ENCOUNTER — Encounter: Payer: Self-pay | Admitting: Nurse Practitioner

## 2023-07-16 VITALS — BP 112/68 | HR 74 | Temp 98.3°F | Resp 18 | Ht 62.0 in | Wt 120.8 lb

## 2023-07-16 DIAGNOSIS — R1011 Right upper quadrant pain: Secondary | ICD-10-CM

## 2023-07-16 NOTE — Progress Notes (Signed)
BP 112/68   Pulse 74   Temp 98.3 F (36.8 C) (Oral)   Resp 18   Ht 5\' 2"  (1.575 m)   Wt 120 lb 12.8 oz (54.8 kg)   SpO2 99%   BMI 22.09 kg/m    Subjective:    Patient ID: Veronica Gibson, female    DOB: 12/25/1975, 47 y.o.   MRN: [id number]  HPI: Veronica Gibson is a 47 y.o. female  Chief Complaint  Patient presents with   Abdominal Pain    For 2 months   RUQ abdominal pain: patient reports over the last 2 months she has had upper abdominal pain. She says sometimes the pain is so bad she has to rock back and forth.  She reports the pain comes and goes and is not having pain at this time.  She reports that it is usually after eating.  She says that she has also been having waves of nausea.  She reports she has tried tums but it has not really helped.  She denies any fever, diarrhea, constipation, or urinary complaints.  No tenderness on exam.  Exam was benign. Discussed possible gallbladder disease. Recommend decreasing fat in diet to prevent exacerbation. Will get labs and ultrasound.    Relevant past medical, surgical, family and social history reviewed and updated as indicated. Interim medical history since our last visit reviewed. Allergies and medications reviewed and updated.  Review of Systems  Ten systems reviewed and is negative except as mentioned in HPI       Objective:    BP 112/68   Pulse 74   Temp 98.3 F (36.8 C) (Oral)   Resp 18   Ht 5\' 2"  (1.575 m)   Wt 120 lb 12.8 oz (54.8 kg)   SpO2 99%   BMI 22.09 kg/m   Wt Readings from Last 3 Encounters:  07/16/23 120 lb 12.8 oz (54.8 kg)  04/29/23 123 lb 8 oz (56 kg)  11/11/22 123 lb (55.8 kg)    Physical Exam  Constitutional: Patient appears well-developed and well-nourished. No distress.  HEENT: head atraumatic, normocephalic, pupils equal and reactive to light, neck supple Cardiovascular: Normal rate, regular rhythm and normal heart sounds.  No murmur heard. No BLE edema. Pulmonary/Chest: Effort  normal and breath sounds normal. No respiratory distress. Abdominal: Soft.  There is no tenderness. Psychiatric: Patient has a normal mood and affect. behavior is normal. Judgment and thought content normal.  Results for orders placed or performed in visit on 04/29/23  CBC with Differential/Platelet  Result Value Ref Range   WBC 4.0 3.8 - 10.8 Thousand/uL   RBC 4.01 3.80 - 5.10 Million/uL   Hemoglobin 13.0 11.7 - 15.5 g/dL   HCT 32.9 51.8 - 84.1 %   MCV 97.3 80.0 - 100.0 fL   MCH 32.4 27.0 - 33.0 pg   MCHC 33.3 32.0 - 36.0 g/dL   RDW 66.0 63.0 - 16.0 %   Platelets 192 140 - 400 Thousand/uL   MPV 10.4 7.5 - 12.5 fL   Neutro Abs 2,096 1,500 - 7,800 cells/uL   Lymphs Abs 1,328 850 - 3,900 cells/uL   Absolute Monocytes 416 200 - 950 cells/uL   Eosinophils Absolute 140 15 - 500 cells/uL   Basophils Absolute 20 0 - 200 cells/uL   Neutrophils Relative % 52.4 %   Total Lymphocyte 33.2 %   Monocytes Relative 10.4 %   Eosinophils Relative 3.5 %   Basophils Relative 0.5 %  COMPLETE METABOLIC PANEL  WITH GFR  Result Value Ref Range   Glucose, Bld 80 65 - 99 mg/dL   BUN 13 7 - 25 mg/dL   Creat 8.65 7.84 - 6.96 mg/dL   eGFR 295 > OR = 60 MW/UXL/2.44W1   BUN/Creatinine Ratio SEE NOTE: 6 - 22 (calc)   Sodium 138 135 - 146 mmol/L   Potassium 4.9 3.5 - 5.3 mmol/L   Chloride 104 98 - 110 mmol/L   CO2 30 20 - 32 mmol/L   Calcium 9.7 8.6 - 10.2 mg/dL   Total Protein 6.6 6.1 - 8.1 g/dL   Albumin 4.6 3.6 - 5.1 g/dL   Globulin 2.0 1.9 - 3.7 g/dL (calc)   AG Ratio 2.3 1.0 - 2.5 (calc)   Total Bilirubin 1.9 (H) 0.2 - 1.2 mg/dL   Alkaline phosphatase (APISO) 36 31 - 125 U/L   AST 20 10 - 35 U/L   ALT 17 6 - 29 U/L  Lipid panel  Result Value Ref Range   Cholesterol 165 <200 mg/dL   HDL 61 > OR = 50 mg/dL   Triglycerides 61 <027 mg/dL   LDL Cholesterol (Calc) 89 mg/dL (calc)   Total CHOL/HDL Ratio 2.7 <5.0 (calc)   Non-HDL Cholesterol (Calc) 104 <130 mg/dL (calc)  Hemoglobin O5D  Result  Value Ref Range   Hgb A1c MFr Bld 5.2 <5.7 % of total Hgb   Mean Plasma Glucose 103 mg/dL   eAG (mmol/L) 5.7 mmol/L  VITAMIN D 25 Hydroxy (Vit-D Deficiency, Fractures)  Result Value Ref Range   Vit D, 25-Hydroxy 56 30 - 100 ng/mL  Iron, TIBC and Ferritin Panel  Result Value Ref Range   Iron 126 40 - 190 mcg/dL   TIBC 664 403 - 474 mcg/dL (calc)   %SAT 39 16 - 45 % (calc)   Ferritin 59 16 - 232 ng/mL  TSH  Result Value Ref Range   TSH 2.72 mIU/L  B12 and Folate Panel  Result Value Ref Range   Vitamin B-12 588 200 - 1,100 pg/mL   Folate 9.7 ng/mL      Assessment & Plan:   Problem List Items Addressed This Visit   None Visit Diagnoses     RUQ abdominal pain    -  Primary   will get labs and ultrasound   Relevant Orders   US ABDOMEN LIMITED RUQ (LIVER/GB)   CBC with Differential/Platelet   COMPLETE METABOLIC PANEL WITH GFR   Lipase        Follow up plan: Return if symptoms worsen or fail to improve.

## 2023-07-17 LAB — CBC WITH DIFFERENTIAL/PLATELET
Absolute Lymphocytes: 1594 {cells}/uL (ref 850–3900)
Absolute Monocytes: 531 {cells}/uL (ref 200–950)
Basophils Absolute: 38 {cells}/uL (ref 0–200)
Basophils Relative: 0.6 %
Eosinophils Absolute: 403 {cells}/uL (ref 15–500)
Eosinophils Relative: 6.3 %
HCT: 39.2 % (ref 35.0–45.0)
Hemoglobin: 13 g/dL (ref 11.7–15.5)
MCH: 32.3 pg (ref 27.0–33.0)
MCHC: 33.2 g/dL (ref 32.0–36.0)
MCV: 97.3 fL (ref 80.0–100.0)
MPV: 10.5 fL (ref 7.5–12.5)
Monocytes Relative: 8.3 %
Neutro Abs: 3834 {cells}/uL (ref 1500–7800)
Neutrophils Relative %: 59.9 %
Platelets: 206 10*3/uL (ref 140–400)
RBC: 4.03 10*6/uL (ref 3.80–5.10)
RDW: 11.6 % (ref 11.0–15.0)
Total Lymphocyte: 24.9 %
WBC: 6.4 10*3/uL (ref 3.8–10.8)

## 2023-07-17 LAB — COMPLETE METABOLIC PANEL WITH GFR
AG Ratio: 2.2 (calc) (ref 1.0–2.5)
ALT: 16 U/L (ref 6–29)
AST: 20 U/L (ref 10–35)
Albumin: 4.8 g/dL (ref 3.6–5.1)
Alkaline phosphatase (APISO): 37 U/L (ref 31–125)
BUN: 16 mg/dL (ref 7–25)
CO2: 28 mmol/L (ref 20–32)
Calcium: 9.9 mg/dL (ref 8.6–10.2)
Chloride: 103 mmol/L (ref 98–110)
Creat: 0.75 mg/dL (ref 0.50–0.99)
Globulin: 2.2 g/dL (ref 1.9–3.7)
Glucose, Bld: 90 mg/dL (ref 65–99)
Potassium: 4.3 mmol/L (ref 3.5–5.3)
Sodium: 138 mmol/L (ref 135–146)
Total Bilirubin: 1.8 mg/dL — ABNORMAL HIGH (ref 0.2–1.2)
Total Protein: 7 g/dL (ref 6.1–8.1)
eGFR: 99 mL/min/{1.73_m2} (ref >=60)

## 2023-07-17 LAB — LIPASE: Lipase: 25 U/L (ref 7–60)

## 2023-07-21 ENCOUNTER — Other Ambulatory Visit: Payer: Self-pay | Admitting: Nurse Practitioner

## 2023-07-21 ENCOUNTER — Encounter: Payer: Self-pay | Admitting: Nurse Practitioner

## 2023-07-21 ENCOUNTER — Ambulatory Visit
Admission: RE | Admit: 2023-07-21 | Discharge: 2023-07-21 | Disposition: A | Discharge status: Home / Self Care | Payer: BC Managed Care – PPO | Source: Ambulatory Visit | Attending: Nurse Practitioner | Admitting: Nurse Practitioner

## 2023-07-21 DIAGNOSIS — R1011 Right upper quadrant pain: Secondary | ICD-10-CM | POA: Diagnosis not present

## 2023-07-23 ENCOUNTER — Ambulatory Visit
Admission: RE | Admit: 2023-07-23 | Discharge: 2023-07-23 | Disposition: A | Discharge status: Home / Self Care | Payer: BC Managed Care – PPO | Source: Ambulatory Visit | Attending: Nurse Practitioner | Admitting: Nurse Practitioner

## 2023-07-23 DIAGNOSIS — R1013 Epigastric pain: Secondary | ICD-10-CM | POA: Diagnosis not present

## 2023-07-23 DIAGNOSIS — R1011 Right upper quadrant pain: Secondary | ICD-10-CM | POA: Diagnosis not present

## 2023-07-23 DIAGNOSIS — R110 Nausea: Secondary | ICD-10-CM | POA: Insufficient documentation

## 2023-07-23 MED ORDER — TECHNETIUM TC 99M MEBROFENIN IV KIT
5.0000 | PACK | Freq: Once | INTRAVENOUS | Status: AC
  Administered 2023-07-23: 5.25 via INTRAVENOUS

## 2023-07-24 ENCOUNTER — Other Ambulatory Visit: Payer: Self-pay | Admitting: Nurse Practitioner

## 2023-07-24 DIAGNOSIS — R1011 Right upper quadrant pain: Secondary | ICD-10-CM

## 2023-07-24 DIAGNOSIS — J3089 Other allergic rhinitis: Secondary | ICD-10-CM | POA: Diagnosis not present

## 2023-07-24 DIAGNOSIS — J301 Allergic rhinitis due to pollen: Secondary | ICD-10-CM | POA: Diagnosis not present

## 2023-07-24 DIAGNOSIS — J3081 Allergic rhinitis due to animal (cat) (dog) hair and dander: Secondary | ICD-10-CM | POA: Diagnosis not present

## 2023-07-31 ENCOUNTER — Telehealth: Payer: Self-pay | Admitting: Obstetrics and Gynecology

## 2023-07-31 NOTE — Telephone Encounter (Signed)
I contacted the patient via phone to offer sameday nurse visit for self swab for vaginal itching. Ok per [place] to added to the nurse visit. I left voicemail asking the patient  to contact our office.

## 2023-08-01 DIAGNOSIS — J301 Allergic rhinitis due to pollen: Secondary | ICD-10-CM | POA: Diagnosis not present

## 2023-08-01 DIAGNOSIS — J3089 Other allergic rhinitis: Secondary | ICD-10-CM | POA: Diagnosis not present

## 2023-08-01 DIAGNOSIS — J3081 Allergic rhinitis due to animal (cat) (dog) hair and dander: Secondary | ICD-10-CM | POA: Diagnosis not present

## 2023-08-05 NOTE — Progress Notes (Deleted)
Berniece Salines, FNP   No chief complaint on file.   HPI:      [name] is a 47 y.o. [phone] whose LMP was No LMP recorded. (Menstrual status: IUD)., presents today for ***    Patient Active Problem List   Diagnosis Date Noted   Encounter for screening colonoscopy 11/11/2022   Polyp of transverse colon 11/11/2022   Family history of uterine cancer 08/30/2021   Increased bilirubin level 09/06/2020   Leukopenia 09/06/2020   Herpes simplex vulvovaginitis 08/29/2020   ASCUS of cervix with negative high risk HPV 08/29/2020   Recurrent UTI 02/23/2020   H/O anxiety disorder 02/23/2020   Allergies 02/23/2020   Achilles rupture, right 08/10/2017    Past Surgical History:  Procedure Laterality Date   ACHILLES TENDON REPAIR  08/18/2017   BREAST BIOPSY Left 12/31/2022   [place] LT BREAST BX W LOC DEV 1ST LESION IMG BX SPEC US GUIDE 12/31/2022 [facility]   COLONOSCOPY WITH PROPOFOL N/A 11/11/2022   Procedure: COLONOSCOPY WITH PROPOFOL;  Surgeon: Toney Reil, MD;  Location: [facility];  Service: Gastroenterology;  Laterality: N/A;   WISDOM TOOTH EXTRACTION      Family History  Problem Relation Age of Onset   Uterine cancer Paternal Grandmother 32   Coronary artery disease Paternal Grandfather        CABG   Cervical cancer Maternal Aunt        died at 108   Cervical cancer Paternal Aunt 61   Pancreatic cancer Maternal Great-grandmother 61   Uterine cancer Other 27   Colon cancer Other     Social History   Socioeconomic History   Marital status: Divorced    Spouse name: Not on file   Number of children: 2   Years of education: 18   Highest education level: Not on file  Occupational History   Occupation: Photographer: [employer]  Tobacco Use   Smoking status: Never   Smokeless tobacco: Never  Vaping Use   Vaping status: Never Used  Substance and Sexual Activity   Alcohol use: Yes    Comment: OCC   Drug use: No   Sexual  activity: Yes    Birth control/protection: I.U.D.    Comment: Mirena  Other Topics Concern   Not on file  Social History Narrative   Not on file   Social Determinants of Health   Financial Resource Strain: Low Risk  (04/29/2023)   Overall Financial Resource Strain (CARDIA)    Difficulty of Paying Living Expenses: Not hard at all  Food Insecurity: No Food Insecurity (04/29/2023)   Hunger Vital Sign    Worried About Running Out of Food in the Last Year: Never true    Ran Out of Food in the Last Year: Never true  Transportation Needs: No Transportation Needs (04/29/2023)   PRAPARE - Administrator, Civil Service (Medical): No    Lack of Transportation (Non-Medical): No  Physical Activity: Sufficiently Active (04/29/2023)   Exercise Vital Sign    Days of Exercise per Week: 5 days    Minutes of Exercise per Session: 40 min  Stress: No Stress Concern Present (04/29/2023)   [employer] of Occupational Health - Occupational Stress Questionnaire    Feeling of Stress : Not at all  Social Connections: Socially Integrated (04/29/2023)   Social Connection and Isolation Panel [NHANES]    Frequency of Communication with Friends and Family: More than three times a week  Frequency of Social Gatherings with Friends and Family: More than three times a week    Attends Religious Services: 1 to 4 times per year    Active Member of [employer] or Organizations: Yes    Attends Banker Meetings: 1 to 4 times per year    Marital Status: Married  Catering manager Violence: Not At Risk (04/29/2023)   Humiliation, Afraid, Rape, and Kick questionnaire    Fear of Current or Ex-Partner: No    Emotionally Abused: No    Physically Abused: No    Sexually Abused: No    Outpatient Medications Prior to Visit  Medication Sig Dispense Refill   azelastine (ASTELIN) 0.1 % nasal spray SMARTSIG:1-2 Spray(s) Both Nares Twice Daily     cetirizine (ZYRTEC) 10 MG tablet Take 10 mg by mouth daily.      EPINEPHrine 0.3 mg/0.3 mL IJ SOAJ injection Inject 0.3 mg into the muscle as needed.     fluticasone (FLONASE) 50 MCG/ACT nasal spray PLACE 1-2 SPRAY IN EACH NOSTRIL ONCE A DAY  3   levonorgestrel (MIRENA, 52 MG,) 20 MCG/24HR IUD 1 Intra Uterine Device (1 each total) by Intrauterine route once for 1 dose. 1 Intra Uterine Device 0   loratadine (CLARITIN) 10 MG tablet Take 10 mg by mouth 2 (two) times daily.     spironolactone (ALDACTONE) 50 MG tablet Take 50 mg by mouth 2 (two) times daily.     sulfamethoxazole-trimethoprim (BACTRIM) 400-80 MG tablet Take 1 tablet by mouth daily as needed. (Patient not taking: Reported on 04/29/2023)     valACYclovir (VALTREX) 500 MG tablet Take 1 tablet (500 mg total) by mouth daily. 90 tablet 3   No facility-administered medications prior to visit.      ROS:  Review of Systems BREAST: No symptoms   OBJECTIVE:   Vitals:  There were no vitals taken for this visit.  Physical Exam  Results: No results found for this or any previous visit (from the past 24 hour(s)).   Assessment/Plan: No diagnosis found.    No orders of the defined types were placed in this encounter.     No follow-ups on file.  Tyteanna Ost B. Sampson Self, PA-C 08/05/2023 1:52 PM

## 2023-08-06 ENCOUNTER — Other Ambulatory Visit: Payer: BC Managed Care – PPO

## 2023-08-07 ENCOUNTER — Ambulatory Visit: Payer: BC Managed Care – PPO | Admitting: Obstetrics and Gynecology

## 2023-08-14 DIAGNOSIS — J3089 Other allergic rhinitis: Secondary | ICD-10-CM | POA: Diagnosis not present

## 2023-08-14 DIAGNOSIS — J3081 Allergic rhinitis due to animal (cat) (dog) hair and dander: Secondary | ICD-10-CM | POA: Diagnosis not present

## 2023-08-14 DIAGNOSIS — J301 Allergic rhinitis due to pollen: Secondary | ICD-10-CM | POA: Diagnosis not present

## 2023-08-18 ENCOUNTER — Ambulatory Visit (INDEPENDENT_AMBULATORY_CARE_PROVIDER_SITE_OTHER): Payer: Self-pay | Admitting: Physician Assistant

## 2023-08-18 ENCOUNTER — Encounter: Payer: Self-pay | Admitting: Physician Assistant

## 2023-08-18 ENCOUNTER — Other Ambulatory Visit: Payer: Self-pay

## 2023-08-18 VITALS — BP 104/70 | HR 70 | Temp 98.0°F | Ht 66.0 in | Wt 122.0 lb

## 2023-08-18 DIAGNOSIS — Z7184 Encounter for health counseling related to travel: Secondary | ICD-10-CM

## 2023-08-18 MED ORDER — AZITHROMYCIN 250 MG PO TABS: ORAL_TABLET | ORAL | 0 refills | Status: DC

## 2023-08-18 NOTE — Progress Notes (Signed)
[facility] [address] [place], [place] [address]   Office Visit Note  Patient Name: Veronica Gibson Date of Birth [id number]  Medical Record number [id number]  Date of Service: 08/18/2023  Chief Complaint  Patient presents with   office visit    Check up prior to going to [place]     47 y/o F presents to the clinic for requesting a Rx for Traveler's Diarrhea. Pt will be traveling to [place] for a course in January. Pt does take Deet insect repellant with her. Other than the requested Rx patient has no questions or concerns. Up to date with tetanus booster. Last Tdap in 2019,       Current Medication:  Outpatient Encounter Medications as of 08/18/2023  Medication Sig Note   azelastine (ASTELIN) 0.1 % nasal spray Place 1 spray into both nostrils daily.    azithromycin (ZITHROMAX) 250 MG tablet Take 2 tablets by mouth daily x 3 days.    cetirizine (ZYRTEC) 10 MG tablet Take 10 mg by mouth daily.    EPINEPHrine 0.3 mg/0.3 mL IJ SOAJ injection Inject 0.3 mg into the muscle as needed.    fluticasone (FLONASE) 50 MCG/ACT nasal spray PLACE 1-2 SPRAY IN EACH NOSTRIL ONCE A DAY 02/15/2016: Received from: External Pharmacy   levonorgestrel (MIRENA, 52 MG,) 20 MCG/24HR IUD 1 Intra Uterine Device (1 each total) by Intrauterine route once for 1 dose.    loratadine (CLARITIN) 10 MG tablet Take 10 mg by mouth 2 (two) times daily.    spironolactone (ALDACTONE) 50 MG tablet Take 50 mg by mouth 2 (two) times daily.    valACYclovir (VALTREX) 500 MG tablet Take 1 tablet (500 mg total) by mouth daily.    [DISCONTINUED] sulfamethoxazole-trimethoprim (BACTRIM) 400-80 MG tablet Take 1 tablet by mouth daily as needed. (Patient not taking: Reported on 04/29/2023)    No facility-administered encounter medications on file as of 08/18/2023.      Medical History: Past Medical History:  Diagnosis Date   Anxiety    Genital herpes 03/2020   confirmed by HSV 2 IgG   Head  ache    History of mammogram 12/13/2013   CAT 2   History of Papanicolaou smear of cervix 04/07/2014   -/-   Rupture Achilles tendon 08/14/2017   Right   Seasonal allergies    Vaccine for human papilloma virus (HPV) types 6, 11, 16, and 18 administered      Vital Signs: BP 104/70   Pulse 70   Temp 98 F (36.7 C)   Ht 5\' 6"  (1.676 m)   Wt 122 lb (55.3 kg)   SpO2 97%   BMI 19.69 kg/m    Review of Systems  Constitutional: Negative.   HENT: Negative.    Respiratory: Negative.    Cardiovascular: Negative.   Genitourinary: Negative.   Musculoskeletal: Negative.   Neurological: Negative.     Physical Exam Constitutional:      Appearance: Normal appearance.  HENT:     Head: Normocephalic and atraumatic.     Right Ear: External ear normal.     Left Ear: External ear normal.  Eyes:     Extraocular Movements: Extraocular movements intact.  Cardiovascular:     Rate and Rhythm: Normal rate and regular rhythm.  Pulmonary:     Effort: Pulmonary effort is normal.     Breath sounds: Normal breath sounds.  Skin:    General: Skin is warm and dry.  Neurological:  Mental Status: She is alert and oriented to person, place, and time.  Psychiatric:        Mood and Affect: Mood normal.        Behavior: Behavior normal.       Assessment/Plan:  1. Travel advice encounter - azithromycin (ZITHROMAX) 250 MG tablet; Take 2 tablets by mouth daily x 3 days.  Dispense: 6 tablet; Refill: 0  Reviewed immunizations with patient. Pt declined Typhoid vaccine. She declined Rx for anti-malaria. Take medicine as prescribed for onset of bloody diarrhea. Take OTC Imodium and take if onset of NON-bloody diarrhea. Use Deet insect repellant when outdoors along with wearing long sleeves and pants.  Drink only bottled or filtered water.  Pt verbalized understanding and in agreement.    General Counseling: Malayjah verbalizes understanding of the findings of todays visit and agrees with  plan of treatment. I have discussed any further diagnostic evaluation that may be needed or ordered today. We also reviewed her medications today. she has been encouraged to call the office with any questions or concerns that should arise related to todays visit.    Time spent:20 Minutes    Gilberto Better, [place] Physician Assistant

## 2023-08-28 DIAGNOSIS — J301 Allergic rhinitis due to pollen: Secondary | ICD-10-CM | POA: Diagnosis not present

## 2023-08-28 DIAGNOSIS — J3089 Other allergic rhinitis: Secondary | ICD-10-CM | POA: Diagnosis not present

## 2023-08-28 DIAGNOSIS — J3081 Allergic rhinitis due to animal (cat) (dog) hair and dander: Secondary | ICD-10-CM | POA: Diagnosis not present

## 2023-09-08 ENCOUNTER — Ambulatory Visit (INDEPENDENT_AMBULATORY_CARE_PROVIDER_SITE_OTHER): Payer: BC Managed Care – PPO | Admitting: Physician Assistant

## 2023-09-08 VITALS — BP 118/68 | HR 87 | Temp 98.2°F | Resp 16 | Ht 66.0 in | Wt 118.0 lb

## 2023-09-08 DIAGNOSIS — J019 Acute sinusitis, unspecified: Secondary | ICD-10-CM

## 2023-09-08 MED ORDER — AMOXICILLIN-POT CLAVULANATE 875-125 MG PO TABS: 1.0000 | ORAL_TABLET | Freq: Two times a day (BID) | ORAL | 0 refills | Status: DC

## 2023-09-08 NOTE — Patient Instructions (Addendum)
Based on your symptoms and duration of illness, I believe you may have a bacterial sinus infection  These typically resolve with antibiotic therapy along with at-home comfort measures  Today I have sent in a prescription for Augmentin 2000-125 mg to be taken by mouth twice per day for 7 days FINISH THE ENTIRE COURSE unless you develop an allergic reaction or are instructed to discontinue.  It can take a few days for the antibiotic to kick in so I recommend symptomatic relief with over the counter medication such as the following: Dayquil/ Nyquil Theraflu Alkaseltzer  Coricidin - if you have high blood pressure even if it is well managed with medications   These medications typically have Tylenol in them already so you can take Ibuprofen as needed for further pain/ discomfort and fever management/ do not need to supplement with more outside of those medications  I also recommend adding an antihistamine to your daily regimen This includes medications like Claritin, Allegra, Zyrtec- the generics of these work very well and are usually less expensive I recommend using Flonase nasal spray - 2 puffs twice per day to help with your nasal congestion The antihistamines and Flonase can take a few weeks to provide significant relief from allergy symptoms but should start to provide some benefit soon.   Stay well hydrated with at least 75 oz of water per day to help with recovery  If you notice any of the following please let us know: increased fever not responding to Tylenol or Ibuprofen, swelling around your nose or eyes, difficulty seeing,

## 2023-09-08 NOTE — Progress Notes (Signed)
Acute Office Visit   Patient: Veronica Gibson   DOB: [birth year]   47 y.o. Female  MRN: [id number] Visit Date: 09/08/2023  Today's healthcare provider: Oswaldo Conroy Kelci Petrella, PA-C  Introduced myself to the patient as a Secondary school teacher and provided education on APPs in clinical practice.    Chief Complaint  Patient presents with   Sinusitis    X9 days. Has been taking OTC Mucinex.   Cough    Productive, getting worse.   Headache    Daily, worse in mornings.   Subjective    HPI HPI     Sinusitis    Additional comments: X9 days. Has been taking OTC Mucinex.        Cough    Additional comments: Productive, getting worse.        Headache    Additional comments: Daily, worse in mornings.      Last edited by Dollene Primrose, CMA on 09/08/2023  2:57 PM.       Discussed the use of AI scribe software for clinical note transcription with the patient, who gave verbal consent to proceed.  History of Present Illness   The patient, with a history of recurrent sinus infections, presented with a week-long history of severe headache, sinus pain, and brain fog. They reported increased coughing and expectoration of red, yellow, and green sputum from the nose and throat, especially in the mornings. They denied any associated nausea, vomiting, diarrhea, fever, or difficulty breathing.  The patient had been self-managing with Mucinex D and a neti pot for four days, but reported no improvement in symptoms. They also mentioned sleeping with an elevated head to alleviate symptoms.  The patient had been in close contact with their son, who had a recent illness with fever and cough, suspected to be pneumonia. They also reported a brief exposure to a moldy building at work, where they used to experience frequent sinus infections.  The patient had traveled to [place] for Thanksgiving and had tested negative for COVID-19 at home. Despite these measures, the patient's symptoms persisted, prompting them  to seek medical attention.      Duration: ongoing for over a week  Associated symptoms: teeth pain, congestion, brain fog, productive cough in the AM, rhinorrhea  Increased coughing  Intervention: decongestant   Recent sick contacts: she reports her son was sick prior to Thanksgiving  Recent travel: she went to [place] for Thanksgiving and was around a lot of people  COVID testing at home:  used expired test    Result:negative but on expired test   Medications: Outpatient Medications Prior to Visit  Medication Sig Note   azelastine (ASTELIN) 0.1 % nasal spray Place 1 spray into both nostrils daily.    azithromycin (ZITHROMAX) 250 MG tablet Take 2 tablets by mouth daily x 3 days. 09/08/2023: For travelers diarrhea- going on trip in Jan.   cetirizine (ZYRTEC) 10 MG tablet Take 10 mg by mouth daily.    EPINEPHrine 0.3 mg/0.3 mL IJ SOAJ injection Inject 0.3 mg into the muscle as needed.    fluticasone (FLONASE) 50 MCG/ACT nasal spray PLACE 1-2 SPRAY IN EACH NOSTRIL ONCE A DAY    levonorgestrel (MIRENA, 52 MG,) 20 MCG/24HR IUD 1 Intra Uterine Device (1 each total) by Intrauterine route once for 1 dose.    loratadine (CLARITIN) 10 MG tablet Take 10 mg by mouth 2 (two) times daily.    spironolactone (ALDACTONE) 50 MG tablet Take 50 mg by  mouth 2 (two) times daily.    valACYclovir (VALTREX) 500 MG tablet Take 1 tablet (500 mg total) by mouth daily.    No facility-administered medications prior to visit.    Review of Systems  Constitutional:  Positive for chills and fatigue. Negative for fever.  HENT:  Positive for congestion, postnasal drip, rhinorrhea, sinus pressure and sinus pain. Negative for ear pain.   Respiratory:  Positive for cough. Negative for shortness of breath.   Gastrointestinal:  Negative for diarrhea, nausea and vomiting.  Neurological:  Positive for headaches.        Objective    BP 118/68   Pulse 87   Temp 98.2 F (36.8 C) (Temporal)   Resp 16   Ht 5\' 6"   (1.676 m)   Wt 118 lb (53.5 kg)   SpO2 98%   BMI 19.05 kg/m     Physical Exam Vitals reviewed.  Constitutional:      Appearance: She is well-developed.  HENT:     Head: Normocephalic and atraumatic.     Right Ear: Hearing, tympanic membrane and ear canal normal.     Left Ear: Hearing, tympanic membrane and ear canal normal.     Mouth/Throat:     Lips: Pink.     Mouth: Mucous membranes are moist.     Pharynx: Uvula midline. No pharyngeal swelling, oropharyngeal exudate, posterior oropharyngeal erythema, uvula swelling or postnasal drip.  Cardiovascular:     Rate and Rhythm: Normal rate and regular rhythm.     Heart sounds: Normal heart sounds.  Pulmonary:     Effort: Pulmonary effort is normal.     Breath sounds: Normal breath sounds. No wheezing, rhonchi or rales.  Musculoskeletal:        General: Normal range of motion.     Cervical back: Normal range of motion and neck supple.  Lymphadenopathy:     Head:     Right side of head: No submental, submandibular or preauricular adenopathy.     Left side of head: No submental, submandibular or preauricular adenopathy.     Cervical:     Right cervical: No superficial cervical adenopathy.    Left cervical: No superficial cervical adenopathy.     Upper Body:     Right upper body: No supraclavicular adenopathy.     Left upper body: No supraclavicular adenopathy.  Skin:    General: Skin is warm and dry.  Neurological:     Mental Status: She is alert.  Psychiatric:        Mood and Affect: Mood normal.        Speech: Speech normal.        Behavior: Behavior normal.       No results found for any visits on 09/08/23.  Assessment & Plan      No follow-ups on file.      Problem List Items Addressed This Visit   None Visit Diagnoses       Acute sinusitis, recurrence not specified, unspecified location    -  Primary   Relevant Medications   amoxicillin-clavulanate (AUGMENTIN) 875-125 MG tablet     Assessment and  Plan    Acute Sinusitis Acute sinusitis with symptoms persisting for over a week, including headache, sinus pain, nasal discharge (red, yellow, and green), cough, and postnasal drip. No fever, nausea, vomiting, or diarrhea. Recent exposure to a child with suspected pneumonia. Previous sinus infections related to mold exposure. Discussed Augmentin's effectiveness against strep and pneumonia, and the need for a  10-day course. Advised on continuing Mucinex, Robitussin, Flonase, and saline rinse. Recommended Tylenol or ibuprofen for headache and pain management. - Prescribe Augmentin for 10 days, twice daily - Continue Mucinex or Robitussin - Use Flonase and saline rinse - Take Tylenol or ibuprofen as needed for headache and pain.        No follow-ups on file.   I, Misako Roeder E Toshio Slusher, PA-C, have reviewed all documentation for this visit. The documentation on 09/08/23 for the exam, diagnosis, procedures, and orders are all accurate and complete.   Jacquelin Hawking, MHS, PA-C [facility]

## 2023-09-11 DIAGNOSIS — J301 Allergic rhinitis due to pollen: Secondary | ICD-10-CM | POA: Diagnosis not present

## 2023-09-11 DIAGNOSIS — J3081 Allergic rhinitis due to animal (cat) (dog) hair and dander: Secondary | ICD-10-CM | POA: Diagnosis not present

## 2023-09-11 DIAGNOSIS — J3089 Other allergic rhinitis: Secondary | ICD-10-CM | POA: Diagnosis not present

## 2023-09-25 DIAGNOSIS — J301 Allergic rhinitis due to pollen: Secondary | ICD-10-CM | POA: Diagnosis not present

## 2023-09-25 DIAGNOSIS — J3081 Allergic rhinitis due to animal (cat) (dog) hair and dander: Secondary | ICD-10-CM | POA: Diagnosis not present

## 2023-09-25 DIAGNOSIS — J3089 Other allergic rhinitis: Secondary | ICD-10-CM | POA: Diagnosis not present

## 2023-10-08 DIAGNOSIS — J3081 Allergic rhinitis due to animal (cat) (dog) hair and dander: Secondary | ICD-10-CM | POA: Diagnosis not present

## 2023-10-08 DIAGNOSIS — J301 Allergic rhinitis due to pollen: Secondary | ICD-10-CM | POA: Diagnosis not present

## 2023-10-09 DIAGNOSIS — J3089 Other allergic rhinitis: Secondary | ICD-10-CM | POA: Diagnosis not present

## 2023-10-21 DIAGNOSIS — J3089 Other allergic rhinitis: Secondary | ICD-10-CM | POA: Diagnosis not present

## 2023-10-21 DIAGNOSIS — J301 Allergic rhinitis due to pollen: Secondary | ICD-10-CM | POA: Diagnosis not present

## 2023-10-21 DIAGNOSIS — J3081 Allergic rhinitis due to animal (cat) (dog) hair and dander: Secondary | ICD-10-CM | POA: Diagnosis not present

## 2023-10-26 ENCOUNTER — Other Ambulatory Visit: Payer: Self-pay | Admitting: Obstetrics and Gynecology

## 2023-10-26 DIAGNOSIS — A6004 Herpesviral vulvovaginitis: Secondary | ICD-10-CM

## 2023-10-28 DIAGNOSIS — J3089 Other allergic rhinitis: Secondary | ICD-10-CM | POA: Diagnosis not present

## 2023-10-28 DIAGNOSIS — J3081 Allergic rhinitis due to animal (cat) (dog) hair and dander: Secondary | ICD-10-CM | POA: Diagnosis not present

## 2023-10-28 DIAGNOSIS — J301 Allergic rhinitis due to pollen: Secondary | ICD-10-CM | POA: Diagnosis not present

## 2023-11-04 DIAGNOSIS — J301 Allergic rhinitis due to pollen: Secondary | ICD-10-CM | POA: Diagnosis not present

## 2023-11-04 DIAGNOSIS — J3089 Other allergic rhinitis: Secondary | ICD-10-CM | POA: Diagnosis not present

## 2023-11-04 DIAGNOSIS — J3081 Allergic rhinitis due to animal (cat) (dog) hair and dander: Secondary | ICD-10-CM | POA: Diagnosis not present

## 2023-11-06 ENCOUNTER — Ambulatory Visit: Payer: BC Managed Care – PPO | Admitting: Obstetrics and Gynecology

## 2023-11-07 DIAGNOSIS — F411 Generalized anxiety disorder: Secondary | ICD-10-CM | POA: Diagnosis not present

## 2023-11-10 ENCOUNTER — Ambulatory Visit: Payer: Self-pay | Admitting: Oncology

## 2023-11-11 DIAGNOSIS — J301 Allergic rhinitis due to pollen: Secondary | ICD-10-CM | POA: Diagnosis not present

## 2023-11-11 DIAGNOSIS — J3089 Other allergic rhinitis: Secondary | ICD-10-CM | POA: Diagnosis not present

## 2023-11-11 DIAGNOSIS — J3081 Allergic rhinitis due to animal (cat) (dog) hair and dander: Secondary | ICD-10-CM | POA: Diagnosis not present

## 2023-11-12 NOTE — Progress Notes (Unsigned)
No chief complaint on file.    HPI:      [name] is a 48 y.o. [phone] who LMP was No LMP recorded. (Menstrual status: IUD)., presents today for her annual examination.  Her menses are absent with IUD, placed 3/19. No BTB, no dysmen.  Sex activity: single partner, contraception - IUD. Mirena placed 11/25/17. No pain/bleeding. Last Pap: 10/07/22, 08/30/21 and 05/18/20 Results were: ASCUS /neg HPV DNA; repeat due. 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 Was taking macrobid postcoitally with sx control. Had 2 UTIs this past summer that weren't E. Coli. Pt saw urology. Treated with abx and now on bactrim PC due to macrobid resistance. Pt doing well.   Last mammo: 12/10/22 Results were cat 3 LT breast for microcalcifications; bx was neg with PASH, breast exc done 7/24 a [facility]; repeat mammo due in 1 yr There is no FH of breast cancer. There is no FH of ovarian cancer. The patient does not do self-breast exams. Pt with FH uterine cancer in her pat aunt and PGM, genetic testing not indicated.   Tobacco use: The patient denies current or previous tobacco use. Alcohol use: social drinker  No drug use. Exercise: very active  Colonoscopy: 11/11/22 at [facility] with polyps; repeat due after 5 yrs.   She does get adequate calcium and Vitamin D in her diet. Labs with PCP 8/24 Has had issues with heartburn recently. Was traveling with food changes. Hasn't tried meds, sx feel similar to GERD with pregnancy but not as bad.   Past Medical History:  Diagnosis Date   Anxiety    Genital herpes 03/2020   confirmed by HSV 2 IgG   Head ache    History of mammogram 12/13/2013   CAT 2   History of Papanicolaou smear of cervix 04/07/2014   -/-   Rupture Achilles tendon 08/14/2017   Right   Seasonal allergies    Vaccine for human papilloma virus (HPV) types 6, 11, 16, and 18 administered     Past  Surgical History:  Procedure Laterality Date   ACHILLES TENDON REPAIR  08/18/2017   BREAST BIOPSY Left 12/31/2022   [place] LT BREAST BX W LOC DEV 1ST LESION IMG BX SPEC US GUIDE 12/31/2022 [facility]   COLONOSCOPY WITH PROPOFOL N/A 11/11/2022   Procedure: COLONOSCOPY WITH PROPOFOL;  Surgeon: Toney Reil, MD;  Location: [facility] ENDOSCOPY;  Service: Gastroenterology;  Laterality: N/A;   WISDOM TOOTH EXTRACTION      Family History  Problem Relation Age of Onset   Uterine cancer Paternal Grandmother 97   Coronary artery disease Paternal Grandfather        CABG   Cervical cancer Maternal Aunt        died at 10   Cervical cancer Paternal Aunt 14   Pancreatic cancer Maternal Great-grandmother 27   Uterine cancer Other 49   Colon cancer Other     Social History   Socioeconomic History   Marital status: Divorced    Spouse name: Not on file   Number of children: 2   Years of education: 18   Highest education level: Doctorate  Occupational History   Occupation: Photographer: [employer]  Tobacco Use   Smoking status: Never   Smokeless tobacco: Never  Vaping Use   Vaping status: Never Used  Substance and Sexual Activity  Alcohol use: Yes    Comment: OCC   Drug use: No   Sexual activity: Yes    Birth control/protection: I.U.D.    Comment: Mirena  Other Topics Concern   Not on file  Social History Narrative   Not on file   Social Drivers of Health   Financial Resource Strain: Low Risk  (09/08/2023)   Overall Financial Resource Strain (CARDIA)    Difficulty of Paying Living Expenses: Not hard at all  Food Insecurity: No Food Insecurity (09/08/2023)   Hunger Vital Sign    Worried About Running Out of Food in the Last Year: Never true    Ran Out of Food in the Last Year: Never true  Transportation Needs: No Transportation Needs (09/08/2023)   PRAPARE - Administrator, Civil Service (Medical): No    Lack of Transportation (Non-Medical): No   Physical Activity: Sufficiently Active (09/08/2023)   Exercise Vital Sign    Days of Exercise per Week: 4 days    Minutes of Exercise per Session: 40 min  Stress: Stress Concern Present (09/08/2023)   [employer] of Occupational Health - Occupational Stress Questionnaire    Feeling of Stress : Rather much  Social Connections: Moderately Integrated (09/08/2023)   Social Connection and Isolation Panel [NHANES]    Frequency of Communication with Friends and Family: Three times a week    Frequency of Social Gatherings with Friends and Family: Twice a week    Attends Religious Services: More than 4 times per year    Active Member of [employer] or Organizations: Yes    Attends Engineer, structural: More than 4 times per year    Marital Status: Divorced  Intimate Partner Violence: Not At Risk (04/29/2023)   Humiliation, Afraid, Rape, and Kick questionnaire    Fear of Current or Ex-Partner: No    Emotionally Abused: No    Physically Abused: No    Sexually Abused: No     Current Outpatient Medications:    amoxicillin-clavulanate (AUGMENTIN) 875-125 MG tablet, Take 1 tablet by mouth 2 (two) times daily., Disp: 20 tablet, Rfl: 0   azelastine (ASTELIN) 0.1 % nasal spray, Place 1 spray into both nostrils daily., Disp: , Rfl:    azithromycin (ZITHROMAX) 250 MG tablet, Take 2 tablets by mouth daily x 3 days., Disp: 6 tablet, Rfl: 0   cetirizine (ZYRTEC) 10 MG tablet, Take 10 mg by mouth daily., Disp: , Rfl:    EPINEPHrine 0.3 mg/0.3 mL IJ SOAJ injection, Inject 0.3 mg into the muscle as needed., Disp: , Rfl:    fluticasone (FLONASE) 50 MCG/ACT nasal spray, PLACE 1-2 SPRAY IN EACH NOSTRIL ONCE A DAY, Disp: , Rfl: 3   levonorgestrel (MIRENA, 52 MG,) 20 MCG/24HR IUD, 1 Intra Uterine Device (1 each total) by Intrauterine route once for 1 dose., Disp: 1 Intra Uterine Device, Rfl: 0   loratadine (CLARITIN) 10 MG tablet, Take 10 mg by mouth 2 (two) times daily., Disp: , Rfl:    spironolactone  (ALDACTONE) 50 MG tablet, Take 50 mg by mouth 2 (two) times daily., Disp: , Rfl:    valACYclovir (VALTREX) 500 MG tablet, Take 1 tablet (500 mg total) by mouth daily., Disp: 90 tablet, Rfl: 3  ROS:  Review of Systems  Constitutional:  Negative for fatigue, fever and unexpected weight change.  Respiratory:  Negative for cough, shortness of breath and wheezing.   Cardiovascular:  Negative for chest pain, palpitations and leg swelling.  Gastrointestinal:  Positive for constipation.  Negative for blood in stool, diarrhea, nausea and vomiting.  Endocrine: Negative for cold intolerance, heat intolerance and polyuria.  Genitourinary:  Negative for dyspareunia, dysuria, flank pain, frequency, genital sores, hematuria, menstrual problem, pelvic pain, urgency, vaginal bleeding, vaginal discharge and vaginal pain.  Musculoskeletal:  Negative for back pain, joint swelling and myalgias.  Skin:  Negative for rash.  Neurological:  Negative for dizziness, syncope, light-headedness, numbness and headaches.  Hematological:  Negative for adenopathy.  Psychiatric/Behavioral:  Negative for agitation, confusion, dysphoric mood, sleep disturbance and suicidal ideas. The patient is not nervous/anxious.      Objective: There were no vitals taken for this visit.   Physical Exam Constitutional:      Appearance: She is well-developed.  Genitourinary:     Vulva normal.     Right Labia: No rash, tenderness or lesions.    Left Labia: No tenderness, lesions or rash.    No vaginal discharge, erythema or tenderness.      Right Adnexa: not tender and no mass present.    Left Adnexa: not tender and no mass present.    No cervical friability or polyp.     IUD strings visualized.     Uterus is not enlarged or tender.  Breasts:    Right: No mass, nipple discharge, skin change or tenderness.     Left: No mass, nipple discharge, skin change or tenderness.  Neck:     Thyroid: No thyromegaly.  Cardiovascular:      Rate and Rhythm: Normal rate and regular rhythm.     Heart sounds: Normal heart sounds. No murmur heard. Pulmonary:     Effort: Pulmonary effort is normal.     Breath sounds: Normal breath sounds.  Abdominal:     Palpations: Abdomen is soft.     Tenderness: There is no abdominal tenderness. There is no guarding or rebound.  Musculoskeletal:        General: Normal range of motion.     Cervical back: Normal range of motion.  Lymphadenopathy:     Cervical: No cervical adenopathy.  Neurological:     General: No focal deficit present.     Mental Status: She is alert and oriented to person, place, and time.     Cranial Nerves: No cranial nerve deficit.  Skin:    General: Skin is warm and dry.  Psychiatric:        Mood and Affect: Mood normal.        Behavior: Behavior normal.        Thought Content: Thought content normal.        Judgment: Judgment normal.  Vitals reviewed.     Assessment/Plan: Encounter for annual routine gynecological examination  Cervical cancer screening - Plan: Cytology - PAP  Screening for HPV (human papillomavirus) - Plan: Cytology - PAP  ASCUS of cervix with negative high risk HPV - Plan: Cytology - PAP; repeat pap today. Will f/u if abn.   Encounter for routine checking of intrauterine contraceptive device (IUD)--IUD strings in cx os, has 8 yr indication  Encounter for screening mammogram for malignant neoplasm of breast - Plan: MM 3D SCREEN BREAST BILATERAL; pt to schedule mammo  Screening for colon cancer - Plan: Ambulatory referral to Gastroenterology; refer to GI  Postcoital UTI--has Bactrim Rx now. Will RF prn.   Herpes simplex vulvovaginitis - Plan: valACYclovir (VALTREX) 500 MG tablet; Rx RF   No orders of the defined types were placed in this encounter.  GYN counsel mammography screening, adequate intake of calcium and vitamin D    F/U  No follow-ups on file.  Rishaan Gunner B. Nyjae Hodge, PA-C 11/12/2023 3:43 PM

## 2023-11-13 ENCOUNTER — Other Ambulatory Visit (HOSPITAL_COMMUNITY)
Admission: RE | Admit: 2023-11-13 | Discharge: 2023-11-13 | Disposition: A | Discharge status: Home / Self Care | Payer: BC Managed Care – PPO | Source: Ambulatory Visit | Attending: Obstetrics and Gynecology | Admitting: Obstetrics and Gynecology

## 2023-11-13 ENCOUNTER — Encounter: Payer: Self-pay | Admitting: Obstetrics and Gynecology

## 2023-11-13 ENCOUNTER — Ambulatory Visit (INDEPENDENT_AMBULATORY_CARE_PROVIDER_SITE_OTHER): Payer: BC Managed Care – PPO | Admitting: Obstetrics and Gynecology

## 2023-11-13 VITALS — BP 93/57 | HR 93 | Ht 66.0 in | Wt 121.0 lb

## 2023-11-13 DIAGNOSIS — Z1151 Encounter for screening for human papillomavirus (HPV): Secondary | ICD-10-CM | POA: Insufficient documentation

## 2023-11-13 DIAGNOSIS — A6004 Herpesviral vulvovaginitis: Secondary | ICD-10-CM

## 2023-11-13 DIAGNOSIS — Z124 Encounter for screening for malignant neoplasm of cervix: Secondary | ICD-10-CM | POA: Diagnosis not present

## 2023-11-13 DIAGNOSIS — R87610 Atypical squamous cells of undetermined significance on cytologic smear of cervix (ASC-US): Secondary | ICD-10-CM | POA: Diagnosis not present

## 2023-11-13 DIAGNOSIS — Z01419 Encounter for gynecological examination (general) (routine) without abnormal findings: Secondary | ICD-10-CM

## 2023-11-13 DIAGNOSIS — Z1231 Encounter for screening mammogram for malignant neoplasm of breast: Secondary | ICD-10-CM

## 2023-11-13 DIAGNOSIS — Z30431 Encounter for routine checking of intrauterine contraceptive device: Secondary | ICD-10-CM

## 2023-11-13 DIAGNOSIS — N390 Urinary tract infection, site not specified: Secondary | ICD-10-CM

## 2023-11-13 MED ORDER — VALACYCLOVIR HCL 500 MG PO TABS
500.0000 mg | ORAL_TABLET | Freq: Every day | ORAL | 3 refills | Status: AC
Start: 2023-11-13 — End: ?

## 2023-11-13 NOTE — Patient Instructions (Addendum)
 I value your feedback and you entrusting [place] with your care. If you get a [facility] patient survey, I would appreciate you taking the time to let us know about your experience today. Thank you!  [facility] ([place])--[phone]

## 2023-11-18 ENCOUNTER — Other Ambulatory Visit: Payer: Self-pay

## 2023-11-18 ENCOUNTER — Ambulatory Visit (INDEPENDENT_AMBULATORY_CARE_PROVIDER_SITE_OTHER): Payer: Self-pay | Admitting: Oncology

## 2023-11-18 ENCOUNTER — Encounter: Payer: Self-pay | Admitting: Obstetrics and Gynecology

## 2023-11-18 ENCOUNTER — Encounter: Payer: Self-pay | Admitting: Oncology

## 2023-11-18 VITALS — BP 107/69 | HR 59 | Temp 98.6°F | Ht 66.0 in | Wt 120.0 lb

## 2023-11-18 DIAGNOSIS — J301 Allergic rhinitis due to pollen: Secondary | ICD-10-CM | POA: Diagnosis not present

## 2023-11-18 DIAGNOSIS — J3081 Allergic rhinitis due to animal (cat) (dog) hair and dander: Secondary | ICD-10-CM | POA: Diagnosis not present

## 2023-11-18 DIAGNOSIS — J3089 Other allergic rhinitis: Secondary | ICD-10-CM | POA: Diagnosis not present

## 2023-11-18 DIAGNOSIS — Z7184 Encounter for health counseling related to travel: Secondary | ICD-10-CM

## 2023-11-18 DIAGNOSIS — Z91013 Allergy to seafood: Secondary | ICD-10-CM | POA: Diagnosis not present

## 2023-11-18 LAB — CYTOLOGY - PAP
Comment: NEGATIVE
Diagnosis: UNDETERMINED — AB
High risk HPV: NEGATIVE

## 2023-11-18 MED ORDER — AZITHROMYCIN 250 MG PO TABS: ORAL_TABLET | ORAL | 0 refills | Status: AC

## 2023-11-18 NOTE — Progress Notes (Signed)
 [facility] [address] [place], [place] [address]   Office Visit Note  Patient Name: Veronica Gibson Date of Birth [id number]  Medical Record number [id number]  Date of Service: 11/18/2023  Chief Complaint  Patient presents with   Acute Visit    Patient is traveling to the [place] next week for leisure and would like to take an antibiotic with her. 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     48 y/o F presents to the clinic for requesting a Rx for Traveler's Diarrhea.  She is going on a trip with her family in March to the [place].  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  Reports that azithromycin helped her symptoms almost immediately.  She developed abdominal cramping and severe diarrhea in January.  Pt does take Deet insect repellant with her. Other than the requested Rx patient has no questions or concerns. Up to date with tetanus booster. Last Tdap in 2019.    Current Medication:  Outpatient Encounter Medications as of 11/18/2023  Medication Sig   azithromycin (ZITHROMAX) 250 MG tablet Take 2 tablets on day 1, then 1 tablet daily on days 2 through 5   cetirizine (ZYRTEC) 10 MG tablet Take 10 mg by mouth daily as needed.   EPINEPHrine 0.3 mg/0.3 mL IJ SOAJ injection Inject 0.3 mg into the muscle as needed.   fexofenadine (ALLEGRA ALLERGY) 180 MG tablet Take 180 mg by mouth daily as needed (alternates with other allergy medications).   levonorgestrel (MIRENA, 52 MG,) 20 MCG/24HR IUD 1 Intra Uterine Device (1 each total) by Intrauterine route once for 1 dose.   loratadine (CLARITIN) 10 MG tablet Take 10 mg by mouth daily as needed (alternates with cetirizine).   spironolactone (ALDACTONE) 50 MG tablet Take 50 mg by mouth 2 (two) times daily.   Sulfamethoxazole-Trimethoprim (BACTRIM PO) Take 1 tablet by  mouth once a week.   valACYclovir (VALTREX) 500 MG tablet Take 1 tablet (500 mg total) by mouth daily. (Patient not taking: Reported on 11/18/2023)   [DISCONTINUED] amoxicillin-clavulanate (AUGMENTIN) 875-125 MG tablet Take 1 tablet by mouth 2 (two) times daily.   No facility-administered encounter medications on file as of 11/18/2023.    Medical History: Past Medical History:  Diagnosis Date   Anxiety    Genital herpes 03/2020   confirmed by HSV 2 IgG   Head ache    History of mammogram 12/13/2013   CAT 2   History of Papanicolaou smear of cervix 04/07/2014   -/-   Rupture Achilles tendon 08/14/2017   Right   Seasonal allergies    Vaccine for human papilloma virus (HPV) types 6, 11, 16, and 18 administered      Vital Signs: BP 107/69   Pulse (!) 59   Temp 98.6 F (37 C)   Ht 5\' 6"  (1.676 m)   Wt 120 lb (54.4 kg)   SpO2 100%   BMI 19.37 kg/m    Review of Systems  Constitutional: Negative.   HENT: Negative.    Respiratory: Negative.    Cardiovascular: Negative.   Genitourinary: Negative.   Musculoskeletal: Negative.   Neurological: Negative.     Physical Exam Constitutional:      Appearance: Normal appearance.  Cardiovascular:     Rate and Rhythm: Normal rate and regular rhythm.  Pulmonary:  Effort: Pulmonary effort is normal.     Breath sounds: Normal breath sounds.  Abdominal:     General: Bowel sounds are normal.     Palpations: Abdomen is soft.  Musculoskeletal:        General: No swelling. Normal range of motion.  Neurological:     Mental Status: She is alert and oriented to person, place, and time. Mental status is at baseline.    Assessment/Plan: 1. Travel advice encounter (Primary) Reviewed immunizations with patient. Pt declined Typhoid vaccine. She declined Rx for anti-malaria. Take medicine as prescribed for onset of bloody diarrhea. Take OTC Imodium and take if onset of NON-bloody diarrhea. Use Deet insect repellant when outdoors  along with wearing long sleeves and pants.  Drink only bottled or filtered water.  Pt verbalized understanding and in agreement.   - azithromycin (ZITHROMAX) 250 MG tablet; Take 2 tablets on day 1, then 1 tablet daily on days 2 through 5  Dispense: 6 tablet; Refill: 0    General Counseling: Shante verbalizes understanding of the findings of todays visit and agrees with plan of treatment. I have discussed any further diagnostic evaluation that may be needed or ordered today. We also reviewed her medications today. she has been encouraged to call the office with any questions or concerns that should arise related to todays visit.    Time spent:20 Minutes  Durenda Hurt, NP 11/18/2023 3:22 PM

## 2023-11-25 DIAGNOSIS — J3089 Other allergic rhinitis: Secondary | ICD-10-CM | POA: Diagnosis not present

## 2023-11-25 DIAGNOSIS — J3081 Allergic rhinitis due to animal (cat) (dog) hair and dander: Secondary | ICD-10-CM | POA: Diagnosis not present

## 2023-11-25 DIAGNOSIS — J301 Allergic rhinitis due to pollen: Secondary | ICD-10-CM | POA: Diagnosis not present

## 2023-12-02 DIAGNOSIS — J3089 Other allergic rhinitis: Secondary | ICD-10-CM | POA: Diagnosis not present

## 2023-12-02 DIAGNOSIS — J301 Allergic rhinitis due to pollen: Secondary | ICD-10-CM | POA: Diagnosis not present

## 2023-12-02 DIAGNOSIS — J3081 Allergic rhinitis due to animal (cat) (dog) hair and dander: Secondary | ICD-10-CM | POA: Diagnosis not present

## 2023-12-09 DIAGNOSIS — J301 Allergic rhinitis due to pollen: Secondary | ICD-10-CM | POA: Diagnosis not present

## 2023-12-09 DIAGNOSIS — J3089 Other allergic rhinitis: Secondary | ICD-10-CM | POA: Diagnosis not present

## 2023-12-09 DIAGNOSIS — J3081 Allergic rhinitis due to animal (cat) (dog) hair and dander: Secondary | ICD-10-CM | POA: Diagnosis not present

## 2023-12-12 DIAGNOSIS — F411 Generalized anxiety disorder: Secondary | ICD-10-CM | POA: Diagnosis not present

## 2023-12-17 ENCOUNTER — Ambulatory Visit (INDEPENDENT_AMBULATORY_CARE_PROVIDER_SITE_OTHER): Payer: Self-pay | Admitting: Oncology

## 2023-12-17 ENCOUNTER — Encounter: Payer: Self-pay | Admitting: Oncology

## 2023-12-17 ENCOUNTER — Other Ambulatory Visit: Payer: Self-pay

## 2023-12-17 VITALS — BP 111/69 | HR 72 | Temp 99.9°F | Ht 66.0 in

## 2023-12-17 DIAGNOSIS — U071 COVID-19: Secondary | ICD-10-CM

## 2023-12-17 LAB — POC SOFIA 2 FLU + SARS ANTIGEN FIA
Influenza A, POC: NEGATIVE
Influenza B, POC: NEGATIVE
SARS Coronavirus 2 Ag: POSITIVE — AB

## 2023-12-17 MED ORDER — NIRMATRELVIR/RITONAVIR (PAXLOVID)TABLET: 3.0000 | ORAL_TABLET | Freq: Two times a day (BID) | ORAL | 0 refills | Status: AC

## 2023-12-17 NOTE — Progress Notes (Signed)
 [facility]  [address] [place], [place] [address] Phone: [phone] Fax: [phone]   Office Visit Note  Patient Name: Veronica Gibson  Date of [id number]  Med Rec number [id number]  Date of Service: 12/17/2023  Dust mite mixed allergen ext [mite (d. farinae)], Other, Pollen extract, and Shellfish allergy  Chief Complaint  Patient presents with   Sick visit    Patient c/o sore throat, severe headache, chills/sweats, and temperature of 101.9-102. She also states she has pressure in her teeth and face. Symptoms began yesterday. She took Tylenol and Mucinex.     HPI Patient is an 48 y.o. who presents for HA, ST and fever that started yesterday morning.  She thought she just had a sinus infection or a cold but woke up this morning with worsening symptoms.  She took some children's Tylenol last night so that she could sleep.  Had a fever of 101.9.  Mild cough and frontal sinus pressure.  She has also tried Mucinex and use a Nettie pot.  Reports she has had COVID twice before now and did have updated COVID vaccines in the fall.  No sick contacts.  Current Medication:  Outpatient Encounter Medications as of 12/17/2023  Medication Sig   cetirizine (ZYRTEC) 10 MG tablet Take 10 mg by mouth daily as needed.   EPINEPHrine 0.3 mg/0.3 mL IJ SOAJ injection Inject 0.3 mg into the muscle as needed.   fexofenadine (ALLEGRA ALLERGY) 180 MG tablet Take 180 mg by mouth daily as needed (alternates with other allergy medications).   levonorgestrel (MIRENA, 52 MG,) 20 MCG/24HR IUD 1 Intra Uterine Device (1 each total) by Intrauterine route once for 1 dose.   loratadine (CLARITIN) 10 MG tablet Take 10 mg by mouth daily as needed (alternates with cetirizine).   nirmatrelvir/ritonavir (PAXLOVID) 20 x 150 MG & 10 x 100MG  TABS Take 3 tablets by mouth 2 (two) times daily for 5 days. (Take nirmatrelvir 150 mg two tablets twice daily for 5 days and ritonavir 100 mg one tablet twice daily  for 5 days) Patient GFR is 65   spironolactone (ALDACTONE) 50 MG tablet Take 50 mg by mouth 2 (two) times daily.   Sulfamethoxazole-Trimethoprim (BACTRIM PO) Take 1 tablet by mouth once a week.   valACYclovir (VALTREX) 500 MG tablet Take 1 tablet (500 mg total) by mouth daily. (Patient not taking: Reported on 12/17/2023)   No facility-administered encounter medications on file as of 12/17/2023.    Medical History: Past Medical History:  Diagnosis Date   Anxiety    Genital herpes 03/2020   confirmed by HSV 2 IgG   Head ache    History of mammogram 12/13/2013   CAT 2   History of Papanicolaou smear of cervix 04/07/2014   -/-   Rupture Achilles tendon 08/14/2017   Right   Seasonal allergies    Vaccine for human papilloma virus (HPV) types 6, 11, 16, and 18 administered      Vital Signs: BP 111/69   Pulse 72   Temp 99.9 F (37.7 C)   Ht 5\' 6"  (1.676 m)   SpO2 98%   BMI 19.37 kg/m   ROS: As per HPI.  All other pertinent ROS negative.     Review of Systems  Constitutional:  Positive for appetite change, chills, diaphoresis, fatigue and fever.  HENT:  Positive for sinus pressure and sore throat. Negative for ear pain.   Respiratory:  Positive for cough.   Gastrointestinal:  Negative for diarrhea,  nausea and vomiting.  Musculoskeletal:  Positive for myalgias. Negative for back pain.  Neurological:  Positive for headaches. Negative for dizziness.    Physical Exam Constitutional:      Appearance: Normal appearance.  HENT:     Right Ear: A middle ear effusion is present.     Left Ear: A middle ear effusion is present.     Nose: Mucosal edema, congestion and rhinorrhea present.     Right Turbinates: Not swollen.     Left Turbinates: Not swollen.     Right Sinus: Frontal sinus tenderness present. No maxillary sinus tenderness.     Left Sinus: Frontal sinus tenderness present. No maxillary sinus tenderness.     Mouth/Throat:     Mouth: Mucous membranes are moist.      Tongue: No lesions.     Pharynx: Oropharynx is clear. Posterior oropharyngeal erythema and postnasal drip present.     Tonsils: 0 on the right. 0 on the left.  Cardiovascular:     Rate and Rhythm: Normal rate.  Pulmonary:     Effort: Pulmonary effort is normal.     Breath sounds: Normal breath sounds.  Neurological:     Mental Status: She is alert.     No results found for this or any previous visit (from the past 24 hours).  Assessment/Plan: 1. COVID-19 virus detected (Primary) Positive for COVID.  We discussed an antiviral given she is within the 72 hours of onset of her symptoms.  Patient would like to try Paxlovid.  Reviewed previous labs which show normal kidney and liver function.  Okay for full dose Paxlovid.  Take 3 tablets by mouth twice a day x 5 days.  Also take Tylenol and ibuprofen as needed for headaches, body aches and fever.  May use Mucinex and Nettie pot for nasal congestion and/or throat pain.  Rest for the next few days.  Wear a mask while you have symptoms and/or for 5 total days from when symptoms started.  Please let me know if symptoms do not improve over the next 3 to 4 days.  - POC SOFIA 2 FLU + SARS ANTIGEN FIA - nirmatrelvir/ritonavir (PAXLOVID) 20 x 150 MG & 10 x 100MG  TABS; Take 3 tablets by mouth 2 (two) times daily for 5 days. (Take nirmatrelvir 150 mg two tablets twice daily for 5 days and ritonavir 100 mg one tablet twice daily for 5 days) Patient GFR is 65  Dispense: 30 tablet; Refill: 0   General Counseling: Alexie verbalizes understanding of the findings of todays visit and agrees with plan of treatment. I have discussed any further diagnostic evaluation that may be needed or ordered today. We also reviewed her medications today. she has been encouraged to call the office with any questions or concerns that should arise related to todays visit.   Orders Placed This Encounter  Procedures   POC SOFIA 2 FLU + SARS ANTIGEN FIA    Meds ordered this  encounter  Medications   nirmatrelvir/ritonavir (PAXLOVID) 20 x 150 MG & 10 x 100MG  TABS    Sig: Take 3 tablets by mouth 2 (two) times daily for 5 days. (Take nirmatrelvir 150 mg two tablets twice daily for 5 days and ritonavir 100 mg one tablet twice daily for 5 days) Patient GFR is 65    Dispense:  30 tablet    Refill:  0    I spent 20 minutes dedicated to the care of this patient (face-to-face and non-face-to-face) on the date of  the encounter to include what is described in the assessment and plan.   Durenda Hurt, NP 12/17/2023 8:24 AM

## 2023-12-25 DIAGNOSIS — J3089 Other allergic rhinitis: Secondary | ICD-10-CM | POA: Diagnosis not present

## 2023-12-25 DIAGNOSIS — J301 Allergic rhinitis due to pollen: Secondary | ICD-10-CM | POA: Diagnosis not present

## 2023-12-25 DIAGNOSIS — J3081 Allergic rhinitis due to animal (cat) (dog) hair and dander: Secondary | ICD-10-CM | POA: Diagnosis not present

## 2024-01-01 DIAGNOSIS — J3089 Other allergic rhinitis: Secondary | ICD-10-CM | POA: Diagnosis not present

## 2024-01-01 DIAGNOSIS — J301 Allergic rhinitis due to pollen: Secondary | ICD-10-CM | POA: Diagnosis not present

## 2024-01-01 DIAGNOSIS — J3081 Allergic rhinitis due to animal (cat) (dog) hair and dander: Secondary | ICD-10-CM | POA: Diagnosis not present

## 2024-01-06 DIAGNOSIS — J3089 Other allergic rhinitis: Secondary | ICD-10-CM | POA: Diagnosis not present

## 2024-01-06 DIAGNOSIS — J3081 Allergic rhinitis due to animal (cat) (dog) hair and dander: Secondary | ICD-10-CM | POA: Diagnosis not present

## 2024-01-06 DIAGNOSIS — J301 Allergic rhinitis due to pollen: Secondary | ICD-10-CM | POA: Diagnosis not present

## 2024-01-13 DIAGNOSIS — J301 Allergic rhinitis due to pollen: Secondary | ICD-10-CM | POA: Diagnosis not present

## 2024-01-13 DIAGNOSIS — J3081 Allergic rhinitis due to animal (cat) (dog) hair and dander: Secondary | ICD-10-CM | POA: Diagnosis not present

## 2024-01-13 DIAGNOSIS — J3089 Other allergic rhinitis: Secondary | ICD-10-CM | POA: Diagnosis not present

## 2024-01-20 DIAGNOSIS — J301 Allergic rhinitis due to pollen: Secondary | ICD-10-CM | POA: Diagnosis not present

## 2024-01-20 DIAGNOSIS — J3081 Allergic rhinitis due to animal (cat) (dog) hair and dander: Secondary | ICD-10-CM | POA: Diagnosis not present

## 2024-01-20 DIAGNOSIS — J3089 Other allergic rhinitis: Secondary | ICD-10-CM | POA: Diagnosis not present

## 2024-01-27 DIAGNOSIS — J301 Allergic rhinitis due to pollen: Secondary | ICD-10-CM | POA: Diagnosis not present

## 2024-01-27 DIAGNOSIS — J3081 Allergic rhinitis due to animal (cat) (dog) hair and dander: Secondary | ICD-10-CM | POA: Diagnosis not present

## 2024-01-27 DIAGNOSIS — J3089 Other allergic rhinitis: Secondary | ICD-10-CM | POA: Diagnosis not present

## 2024-01-29 ENCOUNTER — Encounter (HOSPITAL_COMMUNITY): Payer: Self-pay

## 2024-02-03 DIAGNOSIS — J3089 Other allergic rhinitis: Secondary | ICD-10-CM | POA: Diagnosis not present

## 2024-02-03 DIAGNOSIS — J3081 Allergic rhinitis due to animal (cat) (dog) hair and dander: Secondary | ICD-10-CM | POA: Diagnosis not present

## 2024-02-03 DIAGNOSIS — J301 Allergic rhinitis due to pollen: Secondary | ICD-10-CM | POA: Diagnosis not present

## 2024-02-05 ENCOUNTER — Ambulatory Visit
Admission: RE | Admit: 2024-02-05 | Discharge: 2024-02-05 | Disposition: A | Discharge status: Home / Self Care | Payer: BC Managed Care – PPO | Source: Ambulatory Visit | Attending: Obstetrics and Gynecology | Admitting: Obstetrics and Gynecology

## 2024-02-05 DIAGNOSIS — Z1231 Encounter for screening mammogram for malignant neoplasm of breast: Secondary | ICD-10-CM | POA: Insufficient documentation

## 2024-02-10 ENCOUNTER — Ambulatory Visit: Payer: Self-pay | Admitting: Obstetrics and Gynecology

## 2024-02-10 DIAGNOSIS — J3089 Other allergic rhinitis: Secondary | ICD-10-CM | POA: Diagnosis not present

## 2024-02-10 DIAGNOSIS — J301 Allergic rhinitis due to pollen: Secondary | ICD-10-CM | POA: Diagnosis not present

## 2024-02-10 DIAGNOSIS — J3081 Allergic rhinitis due to animal (cat) (dog) hair and dander: Secondary | ICD-10-CM | POA: Diagnosis not present

## 2024-02-17 DIAGNOSIS — J3089 Other allergic rhinitis: Secondary | ICD-10-CM | POA: Diagnosis not present

## 2024-02-17 DIAGNOSIS — J301 Allergic rhinitis due to pollen: Secondary | ICD-10-CM | POA: Diagnosis not present

## 2024-02-17 DIAGNOSIS — J3081 Allergic rhinitis due to animal (cat) (dog) hair and dander: Secondary | ICD-10-CM | POA: Diagnosis not present

## 2024-02-25 DIAGNOSIS — F411 Generalized anxiety disorder: Secondary | ICD-10-CM | POA: Diagnosis not present

## 2024-02-26 DIAGNOSIS — J3081 Allergic rhinitis due to animal (cat) (dog) hair and dander: Secondary | ICD-10-CM | POA: Diagnosis not present

## 2024-02-26 DIAGNOSIS — J301 Allergic rhinitis due to pollen: Secondary | ICD-10-CM | POA: Diagnosis not present

## 2024-02-26 DIAGNOSIS — J3089 Other allergic rhinitis: Secondary | ICD-10-CM | POA: Diagnosis not present

## 2024-03-04 DIAGNOSIS — J3089 Other allergic rhinitis: Secondary | ICD-10-CM | POA: Diagnosis not present

## 2024-03-04 DIAGNOSIS — J301 Allergic rhinitis due to pollen: Secondary | ICD-10-CM | POA: Diagnosis not present

## 2024-03-04 DIAGNOSIS — J3081 Allergic rhinitis due to animal (cat) (dog) hair and dander: Secondary | ICD-10-CM | POA: Diagnosis not present

## 2024-03-09 DIAGNOSIS — J3089 Other allergic rhinitis: Secondary | ICD-10-CM | POA: Diagnosis not present

## 2024-03-09 DIAGNOSIS — J3081 Allergic rhinitis due to animal (cat) (dog) hair and dander: Secondary | ICD-10-CM | POA: Diagnosis not present

## 2024-03-09 DIAGNOSIS — J301 Allergic rhinitis due to pollen: Secondary | ICD-10-CM | POA: Diagnosis not present

## 2024-03-15 DIAGNOSIS — D2262 Melanocytic nevi of left upper limb, including shoulder: Secondary | ICD-10-CM | POA: Diagnosis not present

## 2024-03-15 DIAGNOSIS — D225 Melanocytic nevi of trunk: Secondary | ICD-10-CM | POA: Diagnosis not present

## 2024-03-15 DIAGNOSIS — L700 Acne vulgaris: Secondary | ICD-10-CM | POA: Diagnosis not present

## 2024-03-15 DIAGNOSIS — D2261 Melanocytic nevi of right upper limb, including shoulder: Secondary | ICD-10-CM | POA: Diagnosis not present

## 2024-03-15 DIAGNOSIS — L570 Actinic keratosis: Secondary | ICD-10-CM | POA: Diagnosis not present

## 2024-03-16 DIAGNOSIS — J301 Allergic rhinitis due to pollen: Secondary | ICD-10-CM | POA: Diagnosis not present

## 2024-03-16 DIAGNOSIS — J3089 Other allergic rhinitis: Secondary | ICD-10-CM | POA: Diagnosis not present

## 2024-03-16 DIAGNOSIS — J3081 Allergic rhinitis due to animal (cat) (dog) hair and dander: Secondary | ICD-10-CM | POA: Diagnosis not present

## 2024-03-23 DIAGNOSIS — J301 Allergic rhinitis due to pollen: Secondary | ICD-10-CM | POA: Diagnosis not present

## 2024-03-23 DIAGNOSIS — J3081 Allergic rhinitis due to animal (cat) (dog) hair and dander: Secondary | ICD-10-CM | POA: Diagnosis not present

## 2024-03-23 DIAGNOSIS — J3089 Other allergic rhinitis: Secondary | ICD-10-CM | POA: Diagnosis not present

## 2024-03-30 DIAGNOSIS — J301 Allergic rhinitis due to pollen: Secondary | ICD-10-CM | POA: Diagnosis not present

## 2024-03-30 DIAGNOSIS — J3089 Other allergic rhinitis: Secondary | ICD-10-CM | POA: Diagnosis not present

## 2024-03-30 DIAGNOSIS — J3081 Allergic rhinitis due to animal (cat) (dog) hair and dander: Secondary | ICD-10-CM | POA: Diagnosis not present

## 2024-03-31 DIAGNOSIS — F411 Generalized anxiety disorder: Secondary | ICD-10-CM | POA: Diagnosis not present

## 2024-04-06 DIAGNOSIS — J301 Allergic rhinitis due to pollen: Secondary | ICD-10-CM | POA: Diagnosis not present

## 2024-04-06 DIAGNOSIS — J3089 Other allergic rhinitis: Secondary | ICD-10-CM | POA: Diagnosis not present

## 2024-04-06 DIAGNOSIS — J3081 Allergic rhinitis due to animal (cat) (dog) hair and dander: Secondary | ICD-10-CM | POA: Diagnosis not present

## 2024-04-20 DIAGNOSIS — J301 Allergic rhinitis due to pollen: Secondary | ICD-10-CM | POA: Diagnosis not present

## 2024-04-20 DIAGNOSIS — J3089 Other allergic rhinitis: Secondary | ICD-10-CM | POA: Diagnosis not present

## 2024-04-20 DIAGNOSIS — J3081 Allergic rhinitis due to animal (cat) (dog) hair and dander: Secondary | ICD-10-CM | POA: Diagnosis not present

## 2024-04-23 DIAGNOSIS — S93402A Sprain of unspecified ligament of left ankle, initial encounter: Secondary | ICD-10-CM | POA: Diagnosis not present

## 2024-04-27 DIAGNOSIS — J3089 Other allergic rhinitis: Secondary | ICD-10-CM | POA: Diagnosis not present

## 2024-04-27 DIAGNOSIS — J301 Allergic rhinitis due to pollen: Secondary | ICD-10-CM | POA: Diagnosis not present

## 2024-04-27 DIAGNOSIS — J3081 Allergic rhinitis due to animal (cat) (dog) hair and dander: Secondary | ICD-10-CM | POA: Diagnosis not present

## 2024-05-04 DIAGNOSIS — J301 Allergic rhinitis due to pollen: Secondary | ICD-10-CM | POA: Diagnosis not present

## 2024-05-04 DIAGNOSIS — J3089 Other allergic rhinitis: Secondary | ICD-10-CM | POA: Diagnosis not present

## 2024-05-04 DIAGNOSIS — J3081 Allergic rhinitis due to animal (cat) (dog) hair and dander: Secondary | ICD-10-CM | POA: Diagnosis not present

## 2024-05-11 DIAGNOSIS — J3089 Other allergic rhinitis: Secondary | ICD-10-CM | POA: Diagnosis not present

## 2024-05-11 DIAGNOSIS — J3081 Allergic rhinitis due to animal (cat) (dog) hair and dander: Secondary | ICD-10-CM | POA: Diagnosis not present

## 2024-05-11 DIAGNOSIS — J301 Allergic rhinitis due to pollen: Secondary | ICD-10-CM | POA: Diagnosis not present

## 2024-05-18 DIAGNOSIS — J3081 Allergic rhinitis due to animal (cat) (dog) hair and dander: Secondary | ICD-10-CM | POA: Diagnosis not present

## 2024-05-18 DIAGNOSIS — J3089 Other allergic rhinitis: Secondary | ICD-10-CM | POA: Diagnosis not present

## 2024-05-18 DIAGNOSIS — J301 Allergic rhinitis due to pollen: Secondary | ICD-10-CM | POA: Diagnosis not present

## 2024-05-19 DIAGNOSIS — J3089 Other allergic rhinitis: Secondary | ICD-10-CM | POA: Diagnosis not present

## 2024-05-25 DIAGNOSIS — J3089 Other allergic rhinitis: Secondary | ICD-10-CM | POA: Diagnosis not present

## 2024-05-25 DIAGNOSIS — J3081 Allergic rhinitis due to animal (cat) (dog) hair and dander: Secondary | ICD-10-CM | POA: Diagnosis not present

## 2024-05-25 DIAGNOSIS — J301 Allergic rhinitis due to pollen: Secondary | ICD-10-CM | POA: Diagnosis not present

## 2024-06-01 DIAGNOSIS — J3089 Other allergic rhinitis: Secondary | ICD-10-CM | POA: Diagnosis not present

## 2024-06-01 DIAGNOSIS — J301 Allergic rhinitis due to pollen: Secondary | ICD-10-CM | POA: Diagnosis not present

## 2024-06-01 DIAGNOSIS — J3081 Allergic rhinitis due to animal (cat) (dog) hair and dander: Secondary | ICD-10-CM | POA: Diagnosis not present

## 2024-06-08 DIAGNOSIS — J3081 Allergic rhinitis due to animal (cat) (dog) hair and dander: Secondary | ICD-10-CM | POA: Diagnosis not present

## 2024-06-08 DIAGNOSIS — J3089 Other allergic rhinitis: Secondary | ICD-10-CM | POA: Diagnosis not present

## 2024-06-08 DIAGNOSIS — J301 Allergic rhinitis due to pollen: Secondary | ICD-10-CM | POA: Diagnosis not present

## 2024-06-15 DIAGNOSIS — J301 Allergic rhinitis due to pollen: Secondary | ICD-10-CM | POA: Diagnosis not present

## 2024-06-15 DIAGNOSIS — J3089 Other allergic rhinitis: Secondary | ICD-10-CM | POA: Diagnosis not present

## 2024-06-15 DIAGNOSIS — J3081 Allergic rhinitis due to animal (cat) (dog) hair and dander: Secondary | ICD-10-CM | POA: Diagnosis not present

## 2024-06-22 DIAGNOSIS — J3081 Allergic rhinitis due to animal (cat) (dog) hair and dander: Secondary | ICD-10-CM | POA: Diagnosis not present

## 2024-06-22 DIAGNOSIS — J301 Allergic rhinitis due to pollen: Secondary | ICD-10-CM | POA: Diagnosis not present

## 2024-06-22 DIAGNOSIS — J3089 Other allergic rhinitis: Secondary | ICD-10-CM | POA: Diagnosis not present

## 2024-06-29 DIAGNOSIS — J3089 Other allergic rhinitis: Secondary | ICD-10-CM | POA: Diagnosis not present

## 2024-06-29 DIAGNOSIS — J3081 Allergic rhinitis due to animal (cat) (dog) hair and dander: Secondary | ICD-10-CM | POA: Diagnosis not present

## 2024-06-29 DIAGNOSIS — J301 Allergic rhinitis due to pollen: Secondary | ICD-10-CM | POA: Diagnosis not present

## 2024-07-06 DIAGNOSIS — J301 Allergic rhinitis due to pollen: Secondary | ICD-10-CM | POA: Diagnosis not present

## 2024-07-06 DIAGNOSIS — J3081 Allergic rhinitis due to animal (cat) (dog) hair and dander: Secondary | ICD-10-CM | POA: Diagnosis not present

## 2024-07-06 DIAGNOSIS — J3089 Other allergic rhinitis: Secondary | ICD-10-CM | POA: Diagnosis not present

## 2024-07-13 DIAGNOSIS — Z87440 Personal history of urinary (tract) infections: Secondary | ICD-10-CM | POA: Diagnosis not present

## 2024-07-13 DIAGNOSIS — J301 Allergic rhinitis due to pollen: Secondary | ICD-10-CM | POA: Diagnosis not present

## 2024-07-13 DIAGNOSIS — J3089 Other allergic rhinitis: Secondary | ICD-10-CM | POA: Diagnosis not present

## 2024-07-13 DIAGNOSIS — J3081 Allergic rhinitis due to animal (cat) (dog) hair and dander: Secondary | ICD-10-CM | POA: Diagnosis not present

## 2024-07-20 DIAGNOSIS — J3089 Other allergic rhinitis: Secondary | ICD-10-CM | POA: Diagnosis not present

## 2024-07-20 DIAGNOSIS — J3081 Allergic rhinitis due to animal (cat) (dog) hair and dander: Secondary | ICD-10-CM | POA: Diagnosis not present

## 2024-07-20 DIAGNOSIS — J301 Allergic rhinitis due to pollen: Secondary | ICD-10-CM | POA: Diagnosis not present

## 2024-07-27 DIAGNOSIS — J301 Allergic rhinitis due to pollen: Secondary | ICD-10-CM | POA: Diagnosis not present

## 2024-07-27 DIAGNOSIS — J3081 Allergic rhinitis due to animal (cat) (dog) hair and dander: Secondary | ICD-10-CM | POA: Diagnosis not present

## 2024-07-27 DIAGNOSIS — J3089 Other allergic rhinitis: Secondary | ICD-10-CM | POA: Diagnosis not present

## 2024-08-03 DIAGNOSIS — J3081 Allergic rhinitis due to animal (cat) (dog) hair and dander: Secondary | ICD-10-CM | POA: Diagnosis not present

## 2024-08-03 DIAGNOSIS — J3089 Other allergic rhinitis: Secondary | ICD-10-CM | POA: Diagnosis not present

## 2024-08-03 DIAGNOSIS — J301 Allergic rhinitis due to pollen: Secondary | ICD-10-CM | POA: Diagnosis not present

## 2024-08-10 DIAGNOSIS — J3089 Other allergic rhinitis: Secondary | ICD-10-CM | POA: Diagnosis not present

## 2024-08-10 DIAGNOSIS — J3081 Allergic rhinitis due to animal (cat) (dog) hair and dander: Secondary | ICD-10-CM | POA: Diagnosis not present

## 2024-08-10 DIAGNOSIS — J301 Allergic rhinitis due to pollen: Secondary | ICD-10-CM | POA: Diagnosis not present

## 2024-08-17 DIAGNOSIS — J301 Allergic rhinitis due to pollen: Secondary | ICD-10-CM | POA: Diagnosis not present

## 2024-08-17 DIAGNOSIS — J3089 Other allergic rhinitis: Secondary | ICD-10-CM | POA: Diagnosis not present

## 2024-08-17 DIAGNOSIS — J3081 Allergic rhinitis due to animal (cat) (dog) hair and dander: Secondary | ICD-10-CM | POA: Diagnosis not present

## 2024-08-26 DIAGNOSIS — J3089 Other allergic rhinitis: Secondary | ICD-10-CM | POA: Diagnosis not present

## 2024-08-26 DIAGNOSIS — J3081 Allergic rhinitis due to animal (cat) (dog) hair and dander: Secondary | ICD-10-CM | POA: Diagnosis not present

## 2024-08-26 DIAGNOSIS — J301 Allergic rhinitis due to pollen: Secondary | ICD-10-CM | POA: Diagnosis not present

## 2024-09-02 DIAGNOSIS — J301 Allergic rhinitis due to pollen: Secondary | ICD-10-CM | POA: Diagnosis not present

## 2024-09-02 DIAGNOSIS — J3089 Other allergic rhinitis: Secondary | ICD-10-CM | POA: Diagnosis not present

## 2024-09-02 DIAGNOSIS — J3081 Allergic rhinitis due to animal (cat) (dog) hair and dander: Secondary | ICD-10-CM | POA: Diagnosis not present

## 2024-09-07 DIAGNOSIS — J301 Allergic rhinitis due to pollen: Secondary | ICD-10-CM | POA: Diagnosis not present

## 2024-09-07 DIAGNOSIS — J3081 Allergic rhinitis due to animal (cat) (dog) hair and dander: Secondary | ICD-10-CM | POA: Diagnosis not present

## 2024-09-07 DIAGNOSIS — J3089 Other allergic rhinitis: Secondary | ICD-10-CM | POA: Diagnosis not present

## 2024-09-14 DIAGNOSIS — J301 Allergic rhinitis due to pollen: Secondary | ICD-10-CM | POA: Diagnosis not present

## 2024-09-14 DIAGNOSIS — J3081 Allergic rhinitis due to animal (cat) (dog) hair and dander: Secondary | ICD-10-CM | POA: Diagnosis not present

## 2024-09-14 DIAGNOSIS — J3089 Other allergic rhinitis: Secondary | ICD-10-CM | POA: Diagnosis not present

## 2024-09-15 DIAGNOSIS — J3081 Allergic rhinitis due to animal (cat) (dog) hair and dander: Secondary | ICD-10-CM | POA: Diagnosis not present

## 2024-09-15 DIAGNOSIS — J301 Allergic rhinitis due to pollen: Secondary | ICD-10-CM | POA: Diagnosis not present

## 2024-09-15 DIAGNOSIS — J3089 Other allergic rhinitis: Secondary | ICD-10-CM | POA: Diagnosis not present

## 2024-09-28 ENCOUNTER — Encounter: Payer: Self-pay | Admitting: Nurse Practitioner

## 2024-09-28 ENCOUNTER — Ambulatory Visit (INDEPENDENT_AMBULATORY_CARE_PROVIDER_SITE_OTHER): Admitting: Nurse Practitioner

## 2024-09-28 VITALS — BP 110/72 | HR 64 | Temp 98.0°F | Ht 66.0 in | Wt 125.0 lb

## 2024-09-28 DIAGNOSIS — H9192 Unspecified hearing loss, left ear: Secondary | ICD-10-CM | POA: Diagnosis not present

## 2024-09-28 DIAGNOSIS — Z0000 Encounter for general adult medical examination without abnormal findings: Secondary | ICD-10-CM

## 2024-09-28 DIAGNOSIS — Z131 Encounter for screening for diabetes mellitus: Secondary | ICD-10-CM

## 2024-09-28 DIAGNOSIS — Z1231 Encounter for screening mammogram for malignant neoplasm of breast: Secondary | ICD-10-CM

## 2024-09-28 DIAGNOSIS — Z130 Encounter for screening for diseases of the blood and blood-forming organs and certain disorders involving the immune mechanism: Secondary | ICD-10-CM | POA: Diagnosis not present

## 2024-09-28 DIAGNOSIS — Z13220 Encounter for screening for lipoid disorders: Secondary | ICD-10-CM | POA: Diagnosis not present

## 2024-09-28 NOTE — Progress Notes (Signed)
 Name: Veronica Gibson   MRN: [id number]    DOB: 06-15-1976   Date:09/28/2024       Progress Note  Subjective  Chief Complaint  Chief Complaint  Patient presents with   Annual Exam    Pt c/o allergy issues.     HPI  Patient presents for annual CPE. Discussed the use of AI scribe software for clinical note transcription with the patient, who gave verbal consent to proceed.  History of Present Illness Veronica Gibson is a 49 year old female who presents for an annual physical exam.  Upper respiratory symptoms and allergies - Constant postnasal drip without green or yellow discharge - History of severe allergies - Alternates between allergy medications; currently taking cetirizine HCL (Zyrtec) and another unspecified medication (possibly Claritin or Allegra) - Reduced use of allergy medications due to dryness causing plaque buildup on teeth - Occasional use of Neti bottle - No longer uses nasal sprays - Crackling sensation in sinuses when jogging - Occasional ear popping when blowing nose - No recent severe sinus headaches  Fatigue - Fatigue attributed to fall semester workload and postnasal drip - Fatigue has improved since the semester ended - Sleeping well, averaging seven hours per night - Running about five days per week and occasionally going to the gym  Hearing changes - History of hearing issues in left ear, especially when using the phone - Symptoms ongoing for several months  Urinary incontinence - Occasional urinary incontinence with violent sneezing - Avoids activities such as jumping on a trampoline due to incontinence - No other significant urinary issues  Preventive health maintenance - Up to date on mammogram (last on Feb 05, 2024) - Colonoscopy completed on November 11, 2022 - Last Pap smear on November 13, 2023, positive for ASCUS; recommended to repeat in one year - IUD in place - HIV and hepatitis C screenings negative - Non-smoker   Eats well  balanced diet Runs 4-5 days a week,  also doing strength training Up to date on dental and eye exams Hearing Screening   500Hz  1000Hz  2000Hz  4000Hz   Right ear Pass Pass Pass Pass  Left ear Pass Fail Pass Pass     Flowsheet Row Office Visit from 09/28/2024 in [facility]  AUDIT-C Score 1   Depression: Phq 9 is  negative    09/28/2024   10:24 AM 07/16/2023    1:25 PM 04/29/2023    9:15 AM 04/12/2022   11:10 AM 02/26/2021    2:32 PM  Depression screen PHQ 2/9  Decreased Interest 0 1 0 1 0  Down, Depressed, Hopeless 0 0 0 1 0  PHQ - 2 Score 0 1 0 2 0  Altered sleeping    0 0  Tired, decreased energy    0 0  Change in appetite    0 0  Feeling bad or failure about yourself     0 0  Trouble concentrating    0 0  Moving slowly or fidgety/restless    0 0  Suicidal thoughts    0 0  PHQ-9 Score    2  0   Difficult doing work/chores    Not difficult at all Not difficult at all     Data saved with a previous flowsheet row definition   Hypertension: BP Readings from Last 3 Encounters:  09/28/24 110/72  12/17/23 111/69  11/18/23 107/69   Obesity: Wt Readings from Last 3 Encounters:  09/28/24 125 lb (56.7 kg)  11/18/23 120 lb (54.4 kg)  11/13/23 121 lb (54.9 kg)   BMI Readings from Last 3 Encounters:  09/28/24 20.18 kg/m  12/17/23 19.37 kg/m  11/18/23 19.37 kg/m     Vaccines:  HPV: up to at age 84 , ask insurance if age between 67-45  Shingrix: 46-64 yo and ask insurance if covered when patient above 40 yo Pneumonia:  educated and discussed with patient. Flu:  educated and discussed with patient.  Hep C Screening: completed STD testing and prevention (HIV/chl/gon/syphilis): completed Intimate partner violence:negative  Sexual History :yes Menstrual History/LMP/Abnormal Bleeding: IUD Incontinence Symptoms: stress incontinence  Breast cancer:  - Last Mammogram: 02/05/2024, order placed - BRCA gene screening: none  Osteoporosis: Discussed high  calcium and vitamin D  supplementation, weight bearing exercises  Cervical cancer screening: 11/13/2023, due for repeat due to ASCUS  Skin cancer: Discussed monitoring for atypical lesions  Colorectal cancer: 11/11/2022   Lung cancer:   Low Dose CT Chest recommended if Age 68-80 years, 20 pack-year currently smoking OR have quit w/in 15years. Patient does not qualify.   ECG: none  Advanced Care Planning: A voluntary discussion about advance care planning including the explanation and discussion of advance directives.  Discussed health care proxy and Living will, and the patient was able to identify a health care proxy as Jocelyn Bushart ( sister) .  Patient does have a living will at present time. If patient does have living will, I have requested they bring this to the clinic to be scanned in to their chart.  Lipids: Lab Results  Component Value Date   CHOL 165 04/29/2023   CHOL 178 02/26/2021   CHOL 142 02/23/2020   Lab Results  Component Value Date   HDL 61 04/29/2023   HDL 63 02/26/2021   HDL 53 02/23/2020   Lab Results  Component Value Date   LDLCALC 89 04/29/2023   LDLCALC 104 (H) 02/26/2021   LDLCALC 80 02/23/2020   Lab Results  Component Value Date   TRIG 61 04/29/2023   TRIG 37 02/26/2021   TRIG 32 02/23/2020   Lab Results  Component Value Date   CHOLHDL 2.7 04/29/2023   CHOLHDL 2.8 02/26/2021   CHOLHDL 2.7 02/23/2020   No results found for: LDLDIRECT  Glucose: Glucose, Bld  Date Value Ref Range Status  07/16/2023 90 65 - 99 mg/dL Final    Comment:    .            Fasting reference interval .   04/29/2023 80 65 - 99 mg/dL Final    Comment:    .            Fasting reference interval .   02/26/2021 75 65 - 99 mg/dL Final    Comment:    .            Fasting reference interval .     Patient Active Problem List   Diagnosis Date Noted   Encounter for screening colonoscopy 11/11/2022   Polyp of transverse colon 11/11/2022   Family history of  uterine cancer 08/30/2021   Increased bilirubin level 09/06/2020   Leukopenia 09/06/2020   Herpes simplex vulvovaginitis 08/29/2020   ASCUS of cervix with negative high risk HPV 08/29/2020   Recurrent UTI 02/23/2020   H/O anxiety disorder 02/23/2020   Allergies 02/23/2020   Achilles rupture, right 08/10/2017    Past Surgical History:  Procedure Laterality Date   ACHILLES TENDON REPAIR  08/18/2017   BREAST BIOPSY Left 12/31/2022  US  LT BREAST BX W LOC DEV 1ST LESION IMG BX SPEC US  GUIDE 12/31/2022 [facility] COLUMNAR CELL CHANGE WITH SMALL FOCUS OF FLAT EPITHELIAL ATYPIA (FEA) AND ASSOCIATED CALCIFICATIONS. - PSEUDOANGIOMATOUS STROMAL HYPERPLASIA.-   BREAST EXCISIONAL BIOPSY Left 03/14/2023   surgical exc done at [facility] [place] CELL CHANGE WITH SMALL FOCUS OF FLAT EPITHELIAL ATYPIA (FEA) AND ASSOCIATED CALCIFICATIONS. - PSEUDOANGIOMATOUS STROMAL HYPERPLASIA.   COLONOSCOPY WITH PROPOFOL  N/A 11/11/2022   Procedure: COLONOSCOPY WITH PROPOFOL ;  Surgeon: Unk Corinn Skiff, MD;  Location: [facility] ENDOSCOPY;  Service: Gastroenterology;  Laterality: N/A;   WISDOM TOOTH EXTRACTION      Family History  Problem Relation Age of Onset   Uterine cancer Paternal Grandmother 76   Cancer Paternal Grandmother    Coronary artery disease Paternal Grandfather        CABG   Cervical cancer Maternal Aunt        died at 9   Cervical cancer Paternal Aunt 23   Cancer Paternal Aunt    Pancreatic cancer Maternal Great-grandmother 56   Uterine cancer Other 53   Colon cancer Other     Social History   Socioeconomic History   Marital status: Divorced    Spouse name: Not on file   Number of children: 2   Years of education: 18   Highest education level: Doctorate  Occupational History   Occupation: Photographer: [employer]  Tobacco Use   Smoking status: Never   Smokeless tobacco: Never  Vaping Use   Vaping status: Never Used  Substance and Sexual Activity   Alcohol use: Yes    Comment:  OCC   Drug use: No   Sexual activity: Yes    Birth control/protection: I.U.D.    Comment: Mirena   Other Topics Concern   Not on file  Social History Narrative   Not on file   Social Drivers of Health   Tobacco Use: Low Risk (09/28/2024)   Patient History    Smoking Tobacco Use: Never    Smokeless Tobacco Use: Never    Passive Exposure: Not on file  Financial Resource Strain: Low Risk (09/28/2024)   Overall Financial Resource Strain (CARDIA)    Difficulty of Paying Living Expenses: Not hard at all  Food Insecurity: No Food Insecurity (09/28/2024)   Epic    Worried About Radiation Protection Practitioner of Food in the Last Year: Never true    Ran Out of Food in the Last Year: Never true  Transportation Needs: No Transportation Needs (09/28/2024)   Epic    Lack of Transportation (Medical): No    Lack of Transportation (Non-Medical): No  Physical Activity: Sufficiently Active (09/28/2024)   Exercise Vital Sign    Days of Exercise per Week: 5 days    Minutes of Exercise per Session: 50 min  Stress: No Stress Concern Present (09/28/2024)   [employer] of Occupational Health - Occupational Stress Questionnaire    Feeling of Stress: Only a little  Social Connections: Moderately Integrated (09/28/2024)   Social Connection and Isolation Panel    Frequency of Communication with Friends and Family: More than three times a week    Frequency of Social Gatherings with Friends and Family: More than three times a week    Attends Religious Services: More than 4 times per year    Active Member of [employer] or Organizations: Yes    Attends Engineer, Structural: More than 4 times per year    Marital Status: Divorced  Catering Manager Violence: Not At  Risk (09/28/2024)   Epic    Fear of Current or Ex-Partner: No    Emotionally Abused: No    Physically Abused: No    Sexually Abused: No  Depression (PHQ2-9): Low Risk (09/28/2024)   Depression (PHQ2-9)    PHQ-2 Score: 0  Alcohol Screen: Low Risk (09/28/2024)   Alcohol  Screen    Last Alcohol Screening Score (AUDIT): 1  Housing: Unknown (09/28/2024)   Epic    Unable to Pay for Housing in the Last Year: No    Number of Times Moved in the Last Year: Not on file    Homeless in the Last Year: No  Utilities: Not At Risk (09/28/2024)   Epic    Threatened with loss of utilities: No  Health Literacy: Adequate Health Literacy (09/28/2024)   B1300 Health Literacy    Frequency of need for help with medical instructions: Never    Current Medications[1]  Allergies[2]   ROS  Constitutional: Negative for fever or weight change.  Respiratory: Negative for cough and shortness of breath.   Cardiovascular: Negative for chest pain or palpitations.  Gastrointestinal: Negative for abdominal pain, no bowel changes.  Musculoskeletal: Negative for gait problem or joint swelling.  Skin: Negative for rash.  Neurological: Negative for dizziness or headache.  No other specific complaints in a complete review of systems (except as listed in HPI above).   Objective  Vitals:   09/28/24 1010  BP: 110/72  Pulse: 64  Temp: 98 F (36.7 C)  SpO2: 97%  Weight: 125 lb (56.7 kg)  Height: 5' 6 (1.676 m)    Body mass index is 20.18 kg/m.  Physical Exam Vitals reviewed.  Constitutional:      Appearance: Normal appearance.  HENT:     Head: Normocephalic.     Right Ear: Tympanic membrane normal.     Left Ear: Tympanic membrane normal.     Nose: Nose normal.  Eyes:     Extraocular Movements: Extraocular movements intact.     Conjunctiva/sclera: Conjunctivae normal.     Pupils: Pupils are equal, round, and reactive to light.  Neck:     Thyroid: No thyroid mass, thyromegaly or thyroid tenderness.  Cardiovascular:     Rate and Rhythm: Normal rate and regular rhythm.     Pulses: Normal pulses.     Heart sounds: Normal heart sounds.  Pulmonary:     Effort: Pulmonary effort is normal.     Breath sounds: Normal breath sounds.  Abdominal:     General: Bowel sounds are  normal.     Palpations: Abdomen is soft.  Musculoskeletal:        General: Normal range of motion.     Cervical back: Normal range of motion and neck supple.     Right lower leg: No edema.     Left lower leg: No edema.  Skin:    General: Skin is warm and dry.     Capillary Refill: Capillary refill takes less than 2 seconds.  Neurological:     General: No focal deficit present.     Mental Status: She is alert and oriented to person, place, and time. Mental status is at baseline.  Psychiatric:        Mood and Affect: Mood normal.        Behavior: Behavior normal.        Thought Content: Thought content normal.        Judgment: Judgment normal.       Fall Risk:  07/16/2023    1:24 PM 04/29/2023    9:15 AM 04/12/2022   11:10 AM 02/26/2021    2:31 PM 02/13/2021    8:52 AM  Fall Risk   Falls in the past year? 0 0 0 0 0  Number falls in past yr: 0 0 0 0 0  Injury with Fall? 0  0  0  0  0   Risk for fall due to : No Fall Risks      Follow up Falls prevention discussed    Falls evaluation completed      Data saved with a previous flowsheet row definition     Functional Status Survey: Is the patient deaf or have difficulty hearing?: No Does the patient have difficulty seeing, even when wearing glasses/contacts?: No Does the patient have difficulty concentrating, remembering, or making decisions?: No Does the patient have difficulty walking or climbing stairs?: No Does the patient have difficulty dressing or bathing?: No Does the patient have difficulty doing errands alone such as visiting a doctor's office or shopping?: No   Assessment & Plan  Problem List Items Addressed This Visit   None Visit Diagnoses       Annual physical exam    -  Primary   Relevant Orders   CBC with Differential/Platelet   Comprehensive metabolic panel with GFR   Lipid panel   Hemoglobin A1c   MM 3D SCREENING MAMMOGRAM BILATERAL BREAST     Screening for cholesterol level       Relevant  Orders   Lipid panel     Screening for diabetes mellitus       Relevant Orders   Comprehensive metabolic panel with GFR   Hemoglobin A1c     Screening for deficiency anemia       Relevant Orders   CBC with Differential/Platelet     Encounter for screening mammogram for malignant neoplasm of breast       Relevant Orders   MM 3D SCREENING MAMMOGRAM BILATERAL BREAST     Hearing loss of left ear, unspecified hearing loss type       Relevant Orders   Ambulatory referral to Audiology      Assessment and Plan Assessment & Plan Allergic rhinitis Chronic allergic rhinitis with postnasal drip and sinus headaches. Symptoms include crackling in sinuses during jogging and ear popping when blowing nose. No signs of infection. Symptoms exacerbated by environmental factors such as dust and mold. Current management includes oral antihistamines (Zyrtec and possibly Claritin or Allegra) and nasal sprays (azelastine and Flonase propionate), though she has been off nasal sprays for six months. Reports dryness and plaque on teeth due to antihistamines. - Trial combination of oral antihistamine and nasal spray to manage symptoms. - Consider alternating antihistamines to prevent tolerance. - Follow up with allergist in February for further evaluation and management.  Hearing loss, left ear Hearing loss in the left ear, particularly at 1000 Hz, noted during hearing test. Symptoms include difficulty hearing in the left ear when using the phone. Duration of symptoms is several months. - Referred to audiology for advanced hearing evaluation.  General Health Maintenance Routine health maintenance is up to date. Mammogram completed in May 2024, colonoscopy in February 2024, and Pap smear in February 2025. Pap smear was positive for ASCUS, with a recommendation to repeat in one year. Blood pressure is well-controlled at 110/72 mmHg. Weight is stable at 125 lbs with a BMI of 20.18. No need for lung cancer screening as  she  is not a smoker. IUD in place, likely effective for up to ten years. Diet is well-balanced, and she engages in regular exercise, including running and gym workouts. Sleep is adequate, with recent fatigue attributed to postnasal drip and semester workload. No issues with incontinence except for occasional minor leakage with sneezing. No violence at home and no need to repeat HIV and hepatitis C screenings as she is with the same partner. - Schedule repeat Pap smear in one year with GYN. - Continue current exercise and dietary habits. - Ensure regular dental check-ups every four months. - Maintain current IUD for contraception.    -USPSTF grade A and B recommendations reviewed with patient; age-appropriate recommendations, preventive care, screening tests, etc discussed and encouraged; healthy living encouraged; see AVS for patient education given to patient -Discussed importance of 150 minutes of physical activity weekly, eat two servings of fish weekly, eat one serving of tree nuts ( cashews, pistachios, pecans, almonds.SABRA) every other day, eat 6 servings of fruit/vegetables daily and drink plenty of water and avoid sweet beverages.   -Reviewed Health Maintenance: yes     [1]  Current Outpatient Medications:    cetirizine (ZYRTEC) 10 MG tablet, Take 10 mg by mouth daily as needed., Disp: , Rfl:    EPINEPHrine  0.3 mg/0.3 mL IJ SOAJ injection, Inject 0.3 mg into the muscle as needed., Disp: , Rfl:    fexofenadine (ALLEGRA ALLERGY) 180 MG tablet, Take 180 mg by mouth daily as needed (alternates with other allergy medications)., Disp: , Rfl:    levonorgestrel  (MIRENA , 52 MG,) 20 MCG/24HR IUD, 1 Intra Uterine Device (1 each total) by Intrauterine route once for 1 dose., Disp: 1 Intra Uterine Device, Rfl: 0   loratadine (CLARITIN) 10 MG tablet, Take 10 mg by mouth daily as needed (alternates with cetirizine)., Disp: , Rfl:    spironolactone (ALDACTONE) 50 MG tablet, Take 50 mg by mouth 2 (two)  times daily., Disp: , Rfl:    Sulfamethoxazole -Trimethoprim  (BACTRIM  PO), Take 1 tablet by mouth once a week., Disp: , Rfl:    valACYclovir  (VALTREX ) 500 MG tablet, Take 1 tablet (500 mg total) by mouth daily., Disp: 90 tablet, Rfl: 3 [2]  Allergies Allergen Reactions   Dust Mite Mixed Allergen Ext [Mite (D. Farinae)] Other (See Comments)    And mold   Other     Other reaction(s): Other (See Comments) Trees, dust   Pollen Extract     Other reaction(s): Unknown   Shellfish Allergy     Mollusks; tolerates lobster and shrimp

## 2024-09-29 ENCOUNTER — Ambulatory Visit: Payer: Self-pay | Admitting: Nurse Practitioner

## 2024-09-29 ENCOUNTER — Other Ambulatory Visit: Payer: Self-pay

## 2024-09-29 DIAGNOSIS — R17 Unspecified jaundice: Secondary | ICD-10-CM

## 2024-09-29 LAB — COMPREHENSIVE METABOLIC PANEL WITH GFR
AG Ratio: 2.1 (calc) (ref 1.0–2.5)
ALT: 33 U/L — ABNORMAL HIGH (ref 6–29)
AST: 28 U/L (ref 10–35)
Albumin: 4.7 g/dL (ref 3.6–5.1)
Alkaline phosphatase (APISO): 41 U/L (ref 31–125)
BUN: 21 mg/dL (ref 7–25)
CO2: 28 mmol/L (ref 20–32)
Calcium: 10 mg/dL (ref 8.6–10.2)
Chloride: 104 mmol/L (ref 98–110)
Creat: 0.79 mg/dL (ref 0.50–0.99)
Globulin: 2.2 g/dL (ref 1.9–3.7)
Glucose, Bld: 83 mg/dL (ref 65–99)
Potassium: 4.1 mmol/L (ref 3.5–5.3)
Sodium: 140 mmol/L (ref 135–146)
Total Bilirubin: 2.4 mg/dL — ABNORMAL HIGH (ref 0.2–1.2)
Total Protein: 6.9 g/dL (ref 6.1–8.1)
eGFR: 93 mL/min/1.73m2

## 2024-09-29 LAB — CBC WITH DIFFERENTIAL/PLATELET
Absolute Lymphocytes: 1235 {cells}/uL (ref 850–3900)
Absolute Monocytes: 454 {cells}/uL (ref 200–950)
Basophils Absolute: 42 {cells}/uL (ref 0–200)
Basophils Relative: 1 %
Eosinophils Absolute: 231 {cells}/uL (ref 15–500)
Eosinophils Relative: 5.5 %
HCT: 39.5 % (ref 35.9–46.0)
Hemoglobin: 13.2 g/dL (ref 11.7–15.5)
MCH: 32.4 pg (ref 27.0–33.0)
MCHC: 33.4 g/dL (ref 31.6–35.4)
MCV: 97.1 fL (ref 81.4–101.7)
MPV: 10.4 fL (ref 7.5–12.5)
Monocytes Relative: 10.8 %
Neutro Abs: 2239 {cells}/uL (ref 1500–7800)
Neutrophils Relative %: 53.3 %
Platelets: 210 Thousand/uL (ref 140–400)
RBC: 4.07 Million/uL (ref 3.80–5.10)
RDW: 11.8 % (ref 11.0–15.0)
Total Lymphocyte: 29.4 %
WBC: 4.2 Thousand/uL (ref 3.8–10.8)

## 2024-09-29 LAB — HEMOGLOBIN A1C
Hgb A1c MFr Bld: 5.2 %
Mean Plasma Glucose: 103 mg/dL
eAG (mmol/L): 5.7 mmol/L

## 2024-09-29 LAB — ADVANCED WRITTEN NOTIFICATION (AWN) TEST REFUSAL: AWN TEST REFUSED: 7600

## 2024-11-15 ENCOUNTER — Ambulatory Visit: Admitting: Certified Nurse Midwife

## 2024-11-16 ENCOUNTER — Ambulatory Visit

## 2025-02-10 ENCOUNTER — Encounter

## 2025-10-19 ENCOUNTER — Encounter: Admitting: Nurse Practitioner
